# Patient Record
Sex: Female | Born: 1970
Health system: Southern US, Community
[De-identification: ages and names within clinical notes are randomized; demographics above are authoritative.]

## PROBLEM LIST (undated history)

## (undated) DIAGNOSIS — M545 Low back pain, unspecified: Secondary | ICD-10-CM

## (undated) DIAGNOSIS — D649 Anemia, unspecified: Secondary | ICD-10-CM

## (undated) DIAGNOSIS — F419 Anxiety disorder, unspecified: Secondary | ICD-10-CM

## (undated) DIAGNOSIS — I10 Essential (primary) hypertension: Secondary | ICD-10-CM

## (undated) DIAGNOSIS — F329 Major depressive disorder, single episode, unspecified: Secondary | ICD-10-CM

## (undated) HISTORY — DX: Low back pain: M54.5

## (undated) HISTORY — DX: Anxiety disorder, unspecified: F41.9

## (undated) HISTORY — PX: TUBAL LIGATION: SHX77

## (undated) HISTORY — DX: Anemia, unspecified: D64.9

## (undated) HISTORY — DX: Essential (primary) hypertension: I10

## (undated) HISTORY — DX: Low back pain, unspecified: M54.50

## (undated) HISTORY — DX: Major depressive disorder, single episode, unspecified: F32.9

---

## 2000-08-24 ENCOUNTER — Encounter: Admission: RE | Admit: 2000-08-24 | Discharge: 2000-08-24 | Payer: Self-pay

## 2000-11-24 ENCOUNTER — Other Ambulatory Visit: Admission: RE | Admit: 2000-11-24 | Discharge: 2000-11-24 | Payer: Self-pay | Admitting: Internal Medicine

## 2003-12-10 ENCOUNTER — Other Ambulatory Visit: Admission: RE | Admit: 2003-12-10 | Discharge: 2003-12-10 | Payer: Self-pay | Admitting: Gynecology

## 2009-12-05 ENCOUNTER — Inpatient Hospital Stay (HOSPITAL_COMMUNITY): Admission: AD | Admit: 2009-12-05 | Discharge: 2009-12-05 | Payer: Self-pay | Admitting: Obstetrics and Gynecology

## 2009-12-06 ENCOUNTER — Inpatient Hospital Stay (HOSPITAL_COMMUNITY): Admission: AD | Admit: 2009-12-06 | Discharge: 2009-12-06 | Payer: Self-pay | Admitting: Obstetrics and Gynecology

## 2009-12-20 ENCOUNTER — Inpatient Hospital Stay (HOSPITAL_COMMUNITY): Admission: AD | Admit: 2009-12-20 | Discharge: 2009-12-21 | Payer: Self-pay | Admitting: Obstetrics and Gynecology

## 2009-12-20 ENCOUNTER — Ambulatory Visit: Payer: Self-pay | Admitting: Advanced Practice Midwife

## 2010-02-17 ENCOUNTER — Inpatient Hospital Stay (HOSPITAL_COMMUNITY): Admission: AD | Admit: 2010-02-17 | Discharge: 2010-02-17 | Payer: Self-pay | Admitting: Obstetrics and Gynecology

## 2010-02-21 ENCOUNTER — Inpatient Hospital Stay (HOSPITAL_COMMUNITY): Admission: AD | Admit: 2010-02-21 | Discharge: 2010-02-24 | Payer: Self-pay | Admitting: Obstetrics and Gynecology

## 2010-02-22 ENCOUNTER — Encounter (INDEPENDENT_AMBULATORY_CARE_PROVIDER_SITE_OTHER): Payer: Self-pay | Admitting: Obstetrics and Gynecology

## 2010-08-14 LAB — CBC
HCT: 28.5 % — ABNORMAL LOW (ref 36.0–46.0)
HCT: 36.7 % (ref 36.0–46.0)
Hemoglobin: 12.6 g/dL (ref 12.0–15.0)
Hemoglobin: 9.8 g/dL — ABNORMAL LOW (ref 12.0–15.0)
MCH: 32.3 pg (ref 26.0–34.0)
MCH: 33 pg (ref 26.0–34.0)
MCHC: 34.3 g/dL (ref 30.0–36.0)
MCHC: 34.5 g/dL (ref 30.0–36.0)
MCV: 94 fL (ref 78.0–100.0)
MCV: 95.6 fL (ref 78.0–100.0)
Platelets: 239 10*3/uL (ref 150–400)
Platelets: 301 10*3/uL (ref 150–400)
RBC: 2.98 MIL/uL — ABNORMAL LOW (ref 3.87–5.11)
RBC: 3.9 MIL/uL (ref 3.87–5.11)
RDW: 12.8 % (ref 11.5–15.5)
RDW: 13 % (ref 11.5–15.5)
WBC: 12 10*3/uL — ABNORMAL HIGH (ref 4.0–10.5)
WBC: 13.7 10*3/uL — ABNORMAL HIGH (ref 4.0–10.5)

## 2010-08-14 LAB — RPR: RPR Ser Ql: NONREACTIVE

## 2010-08-16 LAB — URINALYSIS, ROUTINE W REFLEX MICROSCOPIC
Bilirubin Urine: NEGATIVE
Glucose, UA: NEGATIVE mg/dL
Ketones, ur: NEGATIVE mg/dL
Leukocytes, UA: NEGATIVE
Nitrite: NEGATIVE
Protein, ur: NEGATIVE mg/dL
Specific Gravity, Urine: <1.005 — ABNORMAL LOW (ref 1.005–1.030)
Urobilinogen, UA: 0.2 mg/dL (ref 0.0–1.0)
pH: 6 (ref 5.0–8.0)

## 2010-08-16 LAB — URINE MICROSCOPIC-ADD ON

## 2010-08-17 LAB — URINALYSIS, ROUTINE W REFLEX MICROSCOPIC
Bilirubin Urine: NEGATIVE
Glucose, UA: NEGATIVE mg/dL
Ketones, ur: NEGATIVE mg/dL
Leukocytes, UA: NEGATIVE
Nitrite: NEGATIVE
Protein, ur: NEGATIVE mg/dL
Specific Gravity, Urine: 1.01 (ref 1.005–1.030)
Urobilinogen, UA: 0.2 mg/dL (ref 0.0–1.0)
pH: 6.5 (ref 5.0–8.0)

## 2010-08-17 LAB — URINE MICROSCOPIC-ADD ON

## 2010-08-17 LAB — FETAL FIBRONECTIN: Fetal Fibronectin: NEGATIVE

## 2012-01-12 ENCOUNTER — Other Ambulatory Visit: Payer: Self-pay | Admitting: Obstetrics and Gynecology

## 2012-01-12 DIAGNOSIS — R928 Other abnormal and inconclusive findings on diagnostic imaging of breast: Secondary | ICD-10-CM

## 2012-01-15 ENCOUNTER — Ambulatory Visit
Admission: RE | Admit: 2012-01-15 | Discharge: 2012-01-15 | Disposition: A | Discharge status: Home / Self Care | Payer: 59 | Source: Ambulatory Visit | Attending: Obstetrics and Gynecology | Admitting: Obstetrics and Gynecology

## 2012-01-15 DIAGNOSIS — R928 Other abnormal and inconclusive findings on diagnostic imaging of breast: Secondary | ICD-10-CM

## 2013-01-29 DIAGNOSIS — L03211 Cellulitis of face: Secondary | ICD-10-CM | POA: Insufficient documentation

## 2013-01-29 DIAGNOSIS — L0201 Cutaneous abscess of face: Secondary | ICD-10-CM

## 2013-01-29 HISTORY — DX: Cutaneous abscess of face: L02.01

## 2014-09-19 ENCOUNTER — Ambulatory Visit (INDEPENDENT_AMBULATORY_CARE_PROVIDER_SITE_OTHER): Payer: 59 | Admitting: Family Medicine

## 2014-09-19 VITALS — BP 148/76 | HR 133 | Temp 97.8°F | Resp 18 | Ht 66.0 in | Wt 200.0 lb

## 2014-09-19 DIAGNOSIS — F411 Generalized anxiety disorder: Secondary | ICD-10-CM

## 2014-09-19 DIAGNOSIS — J329 Chronic sinusitis, unspecified: Secondary | ICD-10-CM

## 2014-09-19 DIAGNOSIS — R030 Elevated blood-pressure reading, without diagnosis of hypertension: Secondary | ICD-10-CM | POA: Diagnosis not present

## 2014-09-19 DIAGNOSIS — H1013 Acute atopic conjunctivitis, bilateral: Secondary | ICD-10-CM | POA: Diagnosis not present

## 2014-09-19 DIAGNOSIS — R000 Tachycardia, unspecified: Secondary | ICD-10-CM | POA: Diagnosis not present

## 2014-09-19 DIAGNOSIS — IMO0001 Reserved for inherently not codable concepts without codable children: Secondary | ICD-10-CM

## 2014-09-19 LAB — BASIC METABOLIC PANEL
BUN: 11 mg/dL (ref 6–23)
CO2: 23 mEq/L (ref 19–32)
Calcium: 9.9 mg/dL (ref 8.4–10.5)
Chloride: 102 mEq/L (ref 96–112)
Creat: 0.84 mg/dL (ref 0.50–1.10)
Glucose, Bld: 151 mg/dL — ABNORMAL HIGH (ref 70–99)
Potassium: 4.5 mEq/L (ref 3.5–5.3)
Sodium: 138 mEq/L (ref 135–145)

## 2014-09-19 LAB — TSH: TSH: 1 u[IU]/mL (ref 0.350–4.500)

## 2014-09-19 MED ORDER — OLOPATADINE HCL 0.1 % OP SOLN: 1.0000 [drp] | Freq: Two times a day (BID) | OPHTHALMIC | Status: DC

## 2014-09-19 MED ORDER — LORAZEPAM 1 MG PO TABS: 1.0000 mg | ORAL_TABLET | Freq: Two times a day (BID) | ORAL | Status: DC | PRN

## 2014-09-19 MED ORDER — METOPROLOL SUCCINATE ER 25 MG PO TB24: 25.0000 mg | ORAL_TABLET | Freq: Every day | ORAL | Status: DC

## 2014-09-19 NOTE — Progress Notes (Signed)
Subjective: Patient has had problems with her sinuses being congested for couple of months. She went to see a doctor 6 days ago at a clinic in [place]. She was treated with prednisone, Levaquin, and took an over-the-counter antihistamine (not sure if it had a decongestant) and a steroid nose spray. She has gotten extremely jittery and nervous last couple of days. Her blood pressure was little elevated when she went to that clinic, she is concerned because it's more elevated and the heart rate is high. She does not have any other underlying diseases that she knows of.  Objective: Very nervous lady throat clear. TMs normal. Neck supple without nodes. Chest clear. Heart tachycardic but no murmur. I counted a pulse of 130  EKG shows sinus tachycardia rate 123.  Assessment: Sinusitis, improving Tachycardia Anxiety Blood pressure elevation Allergic conjunctivitis  Plan: Check BMP and TSH Give metipranolol 25 mg daily to try to slow heart rate and lower blood pressure Await for labs Allergy eyedrops Recheck in one week

## 2014-09-19 NOTE — Patient Instructions (Addendum)
Take the metoprolol 25 mg daily  Return next week for a recheck  Take the lorazepam twice daily only when needed for anxiety  Work hard on eating less and getting some regular exercise to try to lose some weight  Return at anytime return to the emergency room if major concern arises.  Discontinue Levaquin  Complete the prednisone course  Avoid decongestants (antihistamines with a D after that in an prescription  Continue using the nasal spray  Avoid vigorous exercise until we get the heart rate down some  Use allergy drops 1 drop each eye twice daily as needed

## 2014-09-25 ENCOUNTER — Ambulatory Visit (INDEPENDENT_AMBULATORY_CARE_PROVIDER_SITE_OTHER): Payer: 59 | Admitting: Family Medicine

## 2014-09-25 VITALS — BP 116/74 | HR 107 | Temp 98.5°F | Resp 18 | Ht 66.0 in | Wt 200.0 lb

## 2014-09-25 DIAGNOSIS — R739 Hyperglycemia, unspecified: Secondary | ICD-10-CM

## 2014-09-25 DIAGNOSIS — R000 Tachycardia, unspecified: Secondary | ICD-10-CM | POA: Diagnosis not present

## 2014-09-25 DIAGNOSIS — F411 Generalized anxiety disorder: Secondary | ICD-10-CM

## 2014-09-25 LAB — POCT GLYCOSYLATED HEMOGLOBIN (HGB A1C): Hemoglobin A1C: 4.8

## 2014-09-25 LAB — GLUCOSE, POCT (MANUAL RESULT ENTRY): POC Glucose: 97 mg/dl (ref 70–99)

## 2014-09-25 MED ORDER — LORAZEPAM 1 MG PO TABS: ORAL_TABLET | ORAL | Status: DC

## 2014-09-25 NOTE — Patient Instructions (Signed)
Referral will be made to the cardiologist. Someone from this office should contact you about it in the next few days. If you do not hear from the referral by early next week call and speak to our referrals desk.  Take the lorazepam one half to one tablet only when needed for anxiety  I think you can resume doing some walking exercise, but don't try to over exert if it causes her heart to run too fast. Discuss activity with the cardiologist.  Once again advise getting away from the cigarettes and minimizing caffeine intake.

## 2014-09-25 NOTE — Progress Notes (Signed)
Subjective:     Results for orders placed or performed in visit on 09/25/14  POCT glucose (manual entry)  Result Value Ref Range   POC Glucose 97 70 - 99 mg/dl  POCT glycosylated hemoglobin (Hb A1C)  Result Value Ref Range   Hemoglobin A1C 4.8    Assessment: Normal glucose Resting tachycardia, nonspecific Anxiety disorder  Plan: Will go ahead and let a cardiologist check her and decide whether she needs to stay on metoprolol or needs to be on a higher dose of something to keep her heart slower. I decided not to add a another her longer acting antianxiety medication (she does not do well with SSRIs so would probably consider buspirone 7.5 twice a day if I do add something else) but will wait until after the cardiologist does things. If he slows her heart down it may help some of her anxiety feelings also.  Use the lorazepam on an as-needed basis only. Return if necessary at anytime, otherwise plan to see me maybe in late May or early June if needed.

## 2014-09-27 ENCOUNTER — Encounter: Payer: Self-pay | Admitting: Cardiology

## 2014-09-27 NOTE — Progress Notes (Signed)
Patient ID: Brittney Bell, female   DOB: Jul 02, 1970, 44 y.o.   MRN: [id number]   Brittney Bell    Date of visit:  09/27/2014 DOB:  07-08-1970    Age:  43 yrs. Medical record number:  [id number]     Account number:  [id number] Primary Care Provider: HOPPER,DAVID ____________________________ CURRENT DIAGNOSES  1. Tachycardia, unspecified  2. Palpitations  3. Chest pain, unspecified  4. Obesity ____________________________ ALLERGIES  No Known Allergies ____________________________ MEDICATIONS  1. lorazepam 1 mg tablet, PRN  2. metoprolol succinate ER 25 mg tablet,extended release 24 hr, 1 p.o. daily  3. Patanol 0.1 % eye drops, 1 gtt OU bid ____________________________ CHIEF COMPLAINTS  Palpations ____________________________ HISTORY OF PRESENT ILLNESS This very nice 44 year old female seen for evaluation of tachycardia. She has been obese since the birth of her second child a couple of years ago. She recently was treated for sinusitis and then noticed that her heart was racing and she had some trouble with dyspnea and was seen at the urgent care Center. She was found to be tachycardic there and her blood pressure was elevated. She describes episodic racing of her heart rate which has been intermittent through the years but she feels that it has been sustained more recently. She does tend to be anxious. She doesn't use a lot of caffeine but does drink a couple of glasses of wine a night. There is no history of cocaine abuse. She has had some episodic sharp chest pain but no anginal type chest pain. She is able to do housework and other activities normally. She has no PND, orthopnea or significant edema. ____________________________ PAST HISTORY  Past Medical Illnesses:  obesity, hypertension;  Cardiovascular Illnesses:  no previous history of cardiac disease.;  Surgical Procedures:  tubal ligation;  NYHA Classification:  I;  Canadian Angina Classification:  Class 0: Asymptomatic;  Cardiology  Procedures-Invasive:  no history of prior cardiac procedures;  Cardiology Procedures-Noninvasive:  no previous non-invasive procedures;  LVEF not documented,   ____________________________ CARDIO-PULMONARY TEST DATES EKG Date:  09/27/2014;   ____________________________ FAMILY HISTORY Brother -- Brother alive and well Brother -- Brother alive and well Brother -- Brother dead Father -- Father dead, Medical history unknown Mother -- Mother alive with problem, Hypertension, Heart disease ____________________________ SOCIAL HISTORY Alcohol Use:  wine 1-2 per day;  Smoking:  smokes less than 1 ppd, 10 pack year history;  Diet:  regular diet;  Lifestyle:  widowed, divorced and currently living with father of child;  Exercise:  no regular exercise;  Occupation:  Conservator, museum/gallery;   ____________________________ REVIEW OF SYSTEMS General:  obesity  Integumentary:no rashes or new skin lesions. Eyes: wears eye glasses/contact lenses Ears, Nose, Throat, Mouth:  denies any hearing loss, epistaxis, hoarseness or difficulty speaking. Respiratory: denies dyspnea, cough, wheezing or hemoptysis. Cardiovascular:  please review HPI Abdominal: denies dyspepsia, GI bleeding, constipation, or diarrheaGenitourinary-Female: no dysuria, urgency, frequency, UTIs, or stress incontinence Musculoskeletal:  denies arthritis, venous insufficiency, or muscle weakness Neurological:  denies headaches, stroke, or TIA Psychiatric:  situational stress financial issues Hematological/Immunologic:  denies any food allergies, bleeding disorders. ____________________________ PHYSICAL EXAMINATION VITAL SIGNS  Blood Pressure:  142/70 Sitting, Right arm, regular cuff  , 138/72 Standing, Right arm and regular cuff   Pulse:  98/min. Weight:  194.00 lbs. Height:  66"BMI: 31  Constitutional:  anxious white female, in no acute distress, moderately obese Skin:  warm and dry to touch, no apparent skin lesions, or  masses noted. Head:  normocephalic, normal hair pattern, no masses or tenderness Eyes:  EOMS Intact, PERRLA, C and S clear, Funduscopic exam not done. ENT:  ears, nose and throat reveal no gross abnormalities.  Dentition good. Neck:  supple, without massess. No JVD, thyromegaly or carotid bruits. Carotid upstroke normal. Chest:  normal symmetry, clear to auscultation. Cardiac:  regular rhythm, normal S1 and S2, No S3 or S4, no murmurs, gallops or rubs detected. Abdomen:  abdomen soft,non-tender, no masses, no hepatospenomegaly, or aneurysm noted Peripheral Pulses:  the femoral,dorsalis pedis, and posterior tibial pulses are full and equal bilaterally with no bruits auscultated. Extremities & Back:  no deformities, clubbing, cyanosis, erythema or edema observed. Normal muscle strength and tone. Neurological:  no gross motor or sensory deficits noted, affect appropriate, oriented x3. ___________________________ IMPRESSIONS/PLAN  1. Persistent tachycardia of uncertain etiology 2. Chest pain with atypical features 3. Obesity would need to lose weight 4. Episodic palpitations 5. Cigarette abuse advised to stop  Recommendations:  Obtain echocardiogram to evaluate cardiac function to rule out structural heart disease as a cause for her tachycardia. Obtain CBC and comprehensive panel to evaluate liver tests and renal function. We're cardiac event monitor to evaluate palpitations and tachycardia. Followup afterwards. Twelve-lead EKG shows sinus tachycardia with nonspecific ST and T-wave changes. ____________________________ TODAYS ORDERS  1. 2D, color flow, doppler: First Available  2. [place]: Today  3. Return Visit: 1 month  4. 24 hour urine for catecholamines/VMA  5. Complete Blood Count: Today  6. Comprehensive Metabolic Panel: Today  7. 12 Lead EKG: Today                       ____________________________ Cardiology Physician:  Darden PalmerW. Spencer Josede Cicero, Jr. MD [facility]

## 2014-12-19 ENCOUNTER — Ambulatory Visit (INDEPENDENT_AMBULATORY_CARE_PROVIDER_SITE_OTHER): Payer: 59 | Admitting: Emergency Medicine

## 2014-12-19 VITALS — BP 116/72 | HR 81 | Temp 98.1°F | Resp 18 | Ht 66.0 in | Wt 199.0 lb

## 2014-12-19 DIAGNOSIS — R062 Wheezing: Secondary | ICD-10-CM | POA: Diagnosis not present

## 2014-12-19 DIAGNOSIS — J029 Acute pharyngitis, unspecified: Secondary | ICD-10-CM | POA: Diagnosis not present

## 2014-12-19 LAB — POCT CBC
Granulocyte percent: 64.9 %G (ref 37–80)
HCT, POC: 41.4 % (ref 37.7–47.9)
Hemoglobin: 14.1 g/dL (ref 12.2–16.2)
Lymph, poc: 2.6 (ref 0.6–3.4)
MCH, POC: 30.9 pg (ref 27–31.2)
MCHC: 34.1 g/dL (ref 31.8–35.4)
MCV: 90.4 fL (ref 80–97)
MID (cbc): 0.4 (ref 0–0.9)
MPV: 7.8 fL (ref 0–99.8)
POC Granulocyte: 5.5 (ref 2–6.9)
POC LYMPH PERCENT: 30.7 %L (ref 10–50)
POC MID %: 4.4 %M (ref 0–12)
Platelet Count, POC: 310 10*3/uL (ref 142–424)
RBC: 4.57 M/uL (ref 4.04–5.48)
RDW, POC: 12.1 %
WBC: 8.4 10*3/uL (ref 4.6–10.2)

## 2014-12-19 LAB — POCT RAPID STREP A (OFFICE): Rapid Strep A Screen: NEGATIVE

## 2014-12-19 MED ORDER — AZITHROMYCIN 250 MG PO TABS
ORAL_TABLET | ORAL | Status: DC
Start: 2014-12-19 — End: 2014-12-19

## 2014-12-19 MED ORDER — AMOXICILLIN-POT CLAVULANATE 875-125 MG PO TABS: 1.0000 | ORAL_TABLET | Freq: Two times a day (BID) | ORAL | Status: DC

## 2014-12-19 MED ORDER — ALBUTEROL SULFATE HFA 108 (90 BASE) MCG/ACT IN AERS: 2.0000 | INHALATION_SPRAY | RESPIRATORY_TRACT | Status: DC | PRN

## 2014-12-19 MED ORDER — ALBUTEROL SULFATE (2.5 MG/3ML) 0.083% IN NEBU
2.5000 mg | INHALATION_SOLUTION | Freq: Once | RESPIRATORY_TRACT | Status: AC
  Administered 2014-12-19: 2.5 mg via RESPIRATORY_TRACT

## 2014-12-19 NOTE — Progress Notes (Addendum)
Patient ID: Brittney Bell, female   DOB: Oct 30, 1970, 44 y.o.   MRN: [id number]     This chart was scribed for Lesle ChrisSteven Daub, MD by Littie Deedsichard Sun, Medical Scribe. This patient was seen in room 12 and the patient's care was started at 1:17 PM.   Chief Complaint:  Chief Complaint  Patient presents with  . Cough    x1 child has uri   . Chest Pain    with cough     HPI: Brittney Bell is a 44 y.o. female [facility] complaining of gradual onset, intermittent, minimally productive cough that started 1 week ago. Patient also reports wheezing, intermittent low-grade fever, and chest tightness. She has been taking Theraflu, Zyrtec and a nasal spray. Of note, her daughter had a URI about 10 days ago. Patient is a 0.25 ppd smoker.  Past Medical History  Diagnosis Date  . Hypertension    Past Surgical History  Procedure Laterality Date  . Tubal ligation     History   Social History  . Marital Status: Divorced    Spouse Name: N/A  . Number of Children: N/A  . Years of Education: N/A   Social History Main Topics  . Smoking status: Current Some Day Smoker  . Smokeless tobacco: Not on file  . Alcohol Use: 0.0 oz/week    0 Standard drinks or equivalent per week  . Drug Use: No  . Sexual Activity: Yes   Other Topics Concern  . None   Social History Narrative   Family History  Problem Relation Age of Onset  . Heart disease Mother   . Hypertension Mother    No Known Allergies Prior to Admission medications   Medication Sig Start Date End Date Taking? Authorizing Provider  LORazepam (ATIVAN) 1 MG tablet Take one half to one pill only when needed for anxiety, once or twice a day if necessary. 09/25/14  Yes Peyton Najjaravid H Hopper, MD  metoprolol succinate (TOPROL-XL) 25 MG 24 hr tablet Take 1 tablet (25 mg total) by mouth daily. 09/19/14  Yes Peyton Najjaravid H Hopper, MD  levofloxacin (LEVAQUIN) 500 MG tablet Take 500 mg by mouth daily.    Historical Provider, MD  olopatadine (PATANOL)  0.1 % ophthalmic solution Place 1 drop into both eyes 2 (two) times daily. Patient not taking: Reported on 12/19/2014 09/19/14   Peyton Najjaravid H Hopper, MD  predniSONE (DELTASONE) 20 MG tablet Take 20 mg by mouth 3 (three) times daily.    Historical Provider, MD     ROS: The patient denies chills, night sweats, unintentional weight loss, chest pain, palpitations, dyspnea on exertion, nausea, vomiting, abdominal pain, dysuria, hematuria, melena, numbness, weakness, or tingling.   All other systems have been reviewed and were otherwise negative with the exception of those mentioned in the HPI and as above.    PHYSICAL EXAM: Filed Vitals:   12/19/14 1300  BP: 116/72  Pulse: 81  Temp: 98.1 F (36.7 C)  Resp: 18   Body mass index is 32.13 kg/(m^2).   General: Alert, no acute distress HEENT:  Normocephalic, atraumatic, oropharynx patent. Throat and ears normal. [facility] Cardiovascular:  Regular rate and rhythm, no rubs murmurs or gallops.  No Carotid bruits, radial pulse intact. No pedal edema.  Respiratory: Bilateral inspiratory and expiratory wheezes. Abdominal: No organomegaly, abdomen is soft and non-tender, positive bowel sounds.  No masses. Musculoskeletal: Gait intact. No edema, tenderness Skin: No rashes. Neurologic: Facial musculature symmetric. Psychiatric: Patient acts appropriately throughout  our interaction. Lymphatic: No cervical or submandibular lymphadenopathy Genitourinary/Anorectal: No acute findings    LABS: Results for orders placed or performed in visit on 12/19/14  POCT CBC  Result Value Ref Range   WBC 8.4 4.6 - 10.2 K/uL   Lymph, poc 2.6 0.6 - 3.4   POC LYMPH PERCENT 30.7 10 - 50 %L   MID (cbc) 0.4 0 - 0.9   POC MID % 4.4 0 - 12 %M   POC Granulocyte 5.5 2 - 6.9   Granulocyte percent 64.9 37 - 80 %G   RBC 4.57 4.04 - 5.48 M/uL   Hemoglobin 14.1 12.2 - 16.2 g/dL   HCT, POC 78.2 95.6 - 47.9 %   MCV 90.4 80 - 97 fL   MCH, POC 30.9 27 - 31.2 pg   MCHC  34.1 31.8 - 35.4 g/dL   RDW, POC 21.3 %   Platelet Count, POC 310 142 - 424 K/uL   MPV 7.8 0 - 99.8 fL  POCT rapid strep A  Result Value Ref Range   Rapid Strep A Screen Negative Negative     EKG/XRAY:   Primary read interpreted by Dr. Cleta Alberts at [facility]. Not done   ASSESSMENT/PLAN: Patient seen with acute bronchitis with bronchospasm. She is a smoker. Will treat with a Z-Pak and albuterol HFA inhaler.I personally performed the services described in this documentation, which was scribed in my presence. The recorded information has been reviewed and is accurate patient did feel better after her nebulizer treatment.Marland Kitchens Gross sideeffects, risk and benefits, and alternatives of medications d/w patient. Patient is aware that all medications have potential sideeffects and we are unable to predict every sideeffect or drug-drug interaction that may occur.I personally performed the services described in this documentation, which was scribed in my presence. The recorded information has been reviewed and is accurate.    Lesle Chris MD 12/19/2014 1:17 PM

## 2014-12-19 NOTE — Patient Instructions (Signed)

## 2015-11-29 ENCOUNTER — Ambulatory Visit (INDEPENDENT_AMBULATORY_CARE_PROVIDER_SITE_OTHER): Payer: BLUE CROSS/BLUE SHIELD | Admitting: Family Medicine

## 2015-11-29 VITALS — BP 150/98 | HR 77 | Temp 97.8°F | Resp 16 | Ht 67.0 in | Wt 200.0 lb

## 2015-11-29 DIAGNOSIS — R000 Tachycardia, unspecified: Secondary | ICD-10-CM | POA: Diagnosis not present

## 2015-11-29 DIAGNOSIS — I10 Essential (primary) hypertension: Secondary | ICD-10-CM | POA: Diagnosis not present

## 2015-11-29 DIAGNOSIS — S40262A Insect bite (nonvenomous) of left shoulder, initial encounter: Secondary | ICD-10-CM

## 2015-11-29 DIAGNOSIS — S30860A Insect bite (nonvenomous) of lower back and pelvis, initial encounter: Secondary | ICD-10-CM

## 2015-11-29 DIAGNOSIS — A084 Viral intestinal infection, unspecified: Secondary | ICD-10-CM

## 2015-11-29 DIAGNOSIS — W57XXXA Bitten or stung by nonvenomous insect and other nonvenomous arthropods, initial encounter: Secondary | ICD-10-CM

## 2015-11-29 DIAGNOSIS — H6983 Other specified disorders of Eustachian tube, bilateral: Secondary | ICD-10-CM | POA: Diagnosis not present

## 2015-11-29 MED ORDER — METOPROLOL SUCCINATE ER 25 MG PO TB24: 25.0000 mg | ORAL_TABLET | Freq: Every day | ORAL | Status: DC

## 2015-11-29 MED ORDER — METOPROLOL SUCCINATE ER 50 MG PO TB24: 50.0000 mg | ORAL_TABLET | Freq: Every day | ORAL | Status: DC

## 2015-11-29 MED ORDER — TRIAMCINOLONE ACETONIDE 0.1 % EX CREA: 1.0000 "application " | TOPICAL_CREAM | Freq: Two times a day (BID) | CUTANEOUS | Status: DC

## 2015-11-29 MED ORDER — FLUTICASONE PROPIONATE 50 MCG/ACT NA SUSP: 2.0000 | Freq: Every day | NASAL | Status: DC

## 2015-11-29 NOTE — Progress Notes (Signed)
Patient ID: Brittney Bell, female    DOB: 1970-08-28  Age: 45 y.o. MRN: [id number]  Chief Complaint  Patient presents with  . Headache  . Sinusitis  . Abdominal Pain    x 4 days  . Diarrhea    x 4 days  . Otitis Media  . insect bites    buttocks/ left shoulder x 4 days  . Depression    See screening    Subjective:   Patient is here for a number of things. She has some insect bites their itching her a lot. Her daughter who is 46 years old had a viral gastroenteritis earlier in the week. The patient has had some diarrhea the last few days. Only a couple times a day. She also is been having some discomfort and popping and pain in her ears. She's has a headache. She takes her blood pressure medicine faithfully. She gets exercise at work but does not do any purposeful exercise. She is here with her college age daughter. She is under stress at home. She is living with the same man for about 5 or 6 years, has a child by him so she stays with him but she is trying to save her money to move out and get away. She says he is hardly with she wants to get away from it.  Current allergies, medications, problem list, past/family and social histories reviewed.  Objective:  BP 150/98 mmHg  Pulse 77  Temp(Src) 97.8 F (36.6 C) (Oral)  Resp 16  Ht 5\' 7"  (1.702 m)  Wt 200 lb (90.719 kg)  BMI 31.32 kg/m2  SpO2 100%  LMP 11/24/2015  No major acute distress. TMs normal. Throat clear. Neck supple without nodes or thyromegaly. Chest clear to auscultation. Heart regular without murmur. Abdomen soft without mass or tenderness. Has some insect bites her left shoulder but asked. Extremities are without edema. Skin normal otherwise.    Assessment & Plan:   Assessment: 1. Tachycardia   2. Essential hypertension   3. Eustachian tube dysfunction, bilateral   4. Viral gastroenteritis   5. Insect bites       Plan: See instructions  No orders of the defined types were placed in this encounter.     Meds ordered this encounter  Medications  . DISCONTD: metoprolol succinate (TOPROL-XL) 25 MG 24 hr tablet    Sig: Take 1 tablet (25 mg total) by mouth daily.    Dispense:  30 tablet    Refill:  3  . fluticasone (FLONASE) 50 MCG/ACT nasal spray    Sig: Place 2 sprays into both nostrils daily.    Dispense:  16 g    Refill:  6  . triamcinolone cream (KENALOG) 0.1 %    Sig: Apply 1 application topically 2 (two) times daily.    Dispense:  30 g    Refill:  0  . metoprolol succinate (TOPROL-XL) 50 MG 24 hr tablet    Sig: Take 1 tablet (50 mg total) by mouth daily.    Dispense:  90 tablet    Refill:  1    Please note I accidentally sent in a prescription for 25 mg. Discontinue that and give her the prescription for 50 mg pills.         Patient Instructions   Use triamcinolone cream twice daily on the insect bites  Increase the blood pressure medication to metoprolol 50 mg daily. Make certain that the level the pharmacy getting her shoes for the 50 mg pills  and not from 25 mg pills. You can double up on your existing 5 mg pills until they are gone.  Work hard on trying to decrease your cigarettes with a goal of getting away from them  Get regular exercise  If the diarrhea persists please return  Use the fluticasone nose spray 2 sprays each nostril twice daily as directed for for 5 days, then decrease to once daily  Plan to return for a regular follow-up on your blood pressure in 4-6 months, sooner if problems arise     IF you received an x-ray today, you will receive an invoice from [facility]. Please contact [facility] at [phone] with questions or concerns regarding your invoice.   IF you received labwork today, you will receive an invoice from [facility]. Please contact [facility] at [phone] with questions or concerns regarding your invoice.   Our billing staff will not be able to assist you with questions regarding  bills from these companies.  You will be contacted with the lab results as soon as they are available. The fastest way to get your results is to activate your My Chart account. Instructions are located on the last page of this paperwork. If you have not heard from us regarding the results in 2 weeks, please contact this office.         Return in about 4 months (around 03/30/2016).   HOPPER,DAVID, MD 11/29/2015

## 2015-11-29 NOTE — Patient Instructions (Addendum)
Use triamcinolone cream twice daily on the insect bites  Increase the blood pressure medication to metoprolol 50 mg daily. Make certain that the level the pharmacy getting her shoes for the 50 mg pills and not from 25 mg pills. You can double up on your existing 5 mg pills until they are gone.  Work hard on trying to decrease your cigarettes with a goal of getting away from them  Get regular exercise  If the diarrhea persists please return  Use the fluticasone nose spray 2 sprays each nostril twice daily as directed for for 5 days, then decrease to once daily  Plan to return for a regular follow-up on your blood pressure in 4-6 months, sooner if problems arise     IF you received an x-ray today, you will receive an invoice from [facility]. Please contact [facility] at [phone] with questions or concerns regarding your invoice.   IF you received labwork today, you will receive an invoice from [facility]. Please contact [facility] at [phone] with questions or concerns regarding your invoice.   Our billing staff will not be able to assist you with questions regarding bills from these companies.  You will be contacted with the lab results as soon as they are available. The fastest way to get your results is to activate your My Chart account. Instructions are located on the last page of this paperwork. If you have not heard from us regarding the results in 2 weeks, please contact this office.

## 2015-12-04 ENCOUNTER — Ambulatory Visit (INDEPENDENT_AMBULATORY_CARE_PROVIDER_SITE_OTHER): Payer: BLUE CROSS/BLUE SHIELD | Admitting: Physician Assistant

## 2015-12-04 ENCOUNTER — Ambulatory Visit (INDEPENDENT_AMBULATORY_CARE_PROVIDER_SITE_OTHER): Payer: BLUE CROSS/BLUE SHIELD

## 2015-12-04 ENCOUNTER — Telehealth: Payer: Self-pay

## 2015-12-04 VITALS — BP 114/68 | HR 67 | Temp 98.0°F | Resp 16 | Ht 67.0 in | Wt 195.6 lb

## 2015-12-04 DIAGNOSIS — M542 Cervicalgia: Secondary | ICD-10-CM | POA: Diagnosis not present

## 2015-12-04 DIAGNOSIS — R519 Headache, unspecified: Secondary | ICD-10-CM

## 2015-12-04 DIAGNOSIS — G44219 Episodic tension-type headache, not intractable: Secondary | ICD-10-CM | POA: Diagnosis not present

## 2015-12-04 DIAGNOSIS — M47812 Spondylosis without myelopathy or radiculopathy, cervical region: Secondary | ICD-10-CM

## 2015-12-04 DIAGNOSIS — R51 Headache: Secondary | ICD-10-CM

## 2015-12-04 DIAGNOSIS — H04123 Dry eye syndrome of bilateral lacrimal glands: Secondary | ICD-10-CM | POA: Diagnosis not present

## 2015-12-04 DIAGNOSIS — M5481 Occipital neuralgia: Secondary | ICD-10-CM | POA: Diagnosis not present

## 2015-12-04 LAB — POCT CBC
Granulocyte percent: 66.1 %G (ref 37–80)
HCT, POC: 42.6 % (ref 37.7–47.9)
Hemoglobin: 14.9 g/dL (ref 12.2–16.2)
Lymph, poc: 2.2 (ref 0.6–3.4)
MCH, POC: 32.4 pg — AB (ref 27–31.2)
MCHC: 34.9 g/dL (ref 31.8–35.4)
MCV: 93 fL (ref 80–97)
MID (cbc): 0.5 (ref 0–0.9)
MPV: 7.6 fL (ref 0–99.8)
POC Granulocyte: 5.2 (ref 2–6.9)
POC LYMPH PERCENT: 28.1 %L (ref 10–50)
POC MID %: 5.8 %M (ref 0–12)
Platelet Count, POC: 270 10*3/uL (ref 142–424)
RBC: 4.59 M/uL (ref 4.04–5.48)
RDW, POC: 13 %
WBC: 7.8 10*3/uL (ref 4.6–10.2)

## 2015-12-04 LAB — COMPLETE METABOLIC PANEL WITH GFR
ALT: 16 U/L (ref 6–29)
AST: 17 U/L (ref 10–35)
Albumin: 4.8 g/dL (ref 3.6–5.1)
Alkaline Phosphatase: 72 U/L (ref 33–115)
BUN: 6 mg/dL — ABNORMAL LOW (ref 7–25)
CO2: 23 mmol/L (ref 20–31)
Calcium: 9.7 mg/dL (ref 8.6–10.2)
Chloride: 106 mmol/L (ref 98–110)
Creat: 0.85 mg/dL (ref 0.50–1.10)
GFR, Est African American: >89 mL/min (ref >=60)
GFR, Est Non African American: 82 mL/min (ref >=60)
Glucose, Bld: 88 mg/dL (ref 65–99)
Potassium: 4.4 mmol/L (ref 3.5–5.3)
Sodium: 140 mmol/L (ref 135–146)
Total Bilirubin: 0.5 mg/dL (ref 0.2–1.2)
Total Protein: 8 g/dL (ref 6.1–8.1)

## 2015-12-04 LAB — POCT SEDIMENTATION RATE: POCT SED RATE: 10 mm/hr (ref 0–22)

## 2015-12-04 MED ORDER — CYCLOBENZAPRINE HCL 10 MG PO TABS: 5.0000 mg | ORAL_TABLET | Freq: Every day | ORAL | Status: DC

## 2015-12-04 MED ORDER — PREDNISONE 20 MG PO TABS: ORAL_TABLET | ORAL | Status: AC

## 2015-12-04 NOTE — Progress Notes (Signed)
12/04/2015 1:18 PM   DOB: November 16, 1970 / MRN: [id number]  SUBJECTIVE:  Brittney Bell is a 45 y.o. female presenting for a left sided HA which she reports as a dull ache This started 5 days ago.  She is somewhat pan-symptomatic and associates temporal pain and changes in vision.  She also complains of neck pain with radiation to all five fingers however her arm is not numb. She denies any global weakness and confusion.  She was sick with a cold a few weeks ago but feels this has resolved.  She recently saw Dr. Alwyn RenHopper for the same complaint and in increased her BP medication.    She is allergic to levaquin.   She  has a past medical history of Hypertension.    She  reports that she has been smoking.  She does not have any smokeless tobacco history on file. She reports that she drinks alcohol. She reports that she does not use illicit drugs. She  reports that she currently engages in sexual activity. The patient  has past surgical history that includes Tubal ligation.  Her family history includes Heart disease in her mother; Hypertension in her mother.  Review of Systems  Constitutional: Negative for fever and chills.  HENT: Negative for congestion, ear pain and sore throat.   Eyes: Positive for photophobia. Negative for blurred vision, double vision, pain, discharge and redness.  Cardiovascular: Negative for chest pain.  Skin: Negative for rash.  Neurological: Positive for headaches.    Problem list and medications reviewed and updated by myself where necessary, and exist elsewhere in the encounter.   OBJECTIVE:  BP 114/68 mmHg  Pulse 67  Temp(Src) 98 F (36.7 C) (Oral)  Resp 16  Ht 5\' 7"  (1.702 m)  Wt 195 lb 9.6 oz (88.724 kg)  BMI 30.63 kg/m2  SpO2 98%  LMP 11/24/2015  Physical Exam  Constitutional: She is oriented to person, place, and time. She appears well-nourished. No distress.  Eyes: EOM are normal. Pupils are equal, round, and reactive to light.  Cardiovascular:  Normal rate.   Pulmonary/Chest: Effort normal.  Abdominal: She exhibits no distension.  Neurological: She is alert and oriented to person, place, and time. She has normal strength and normal reflexes. No cranial nerve deficit or sensory deficit. She displays a negative Romberg sign. Coordination and gait normal. GCS eye subscore is 4. GCS verbal subscore is 5. GCS motor subscore is 6.  Reflex Scores:      Tricep reflexes are 2+ on the right side and 2+ on the left side.      Bicep reflexes are 2+ on the right side and 2+ on the left side.      Brachioradialis reflexes are 2+ on the right side and 2+ on the left side.      Patellar reflexes are 2+ on the right side and 2+ on the left side.      Achilles reflexes are 2+ on the right side and 2+ on the left side. Skin: Skin is dry. She is not diaphoretic.  Psychiatric: She has a normal mood and affect.  Vitals reviewed.    Visual Acuity Screening   Right eye Left eye Both eyes  Without correction:     With correction: 20/25 20/50 20/25    No results found.  Lab Results  Component Value Date   TSH 1.000 09/19/2014   Lab Results  Component Value Date   WBC 7.8 12/04/2015   HGB 14.9 12/04/2015   HCT 42.6  12/04/2015   MCV 93.0 12/04/2015   PLT 239 02/23/2010   Lab Results  Component Value Date   CREATININE 0.84 09/19/2014   Lab Results  Component Value Date   HGBA1C 4.8 09/25/2014    ASSESSMENT AND PLAN  Brittney Bell was seen today for headache.  Diagnoses and all orders for this visit:  Episodic tension-type headache, not intractable: Her cranial nerves are intact and she is well appearing. Her speech is clear, her gait is normal, and there is no weakness. Rads do show that she has some end plate spurring which explains why her left arm has some tingling. I will start her on predisone today.  Sed rate reassuring.  With regard to her complaint of blurry vision, I can not account for this, however I think this may be a  happenstance finding and I have advised that she go to Dr. Laruth BouchardGroat's office to be seen as she does have a history of glaucoma.   -     DG Cervical Spine 2 or 3 views; Future -     DG SinUS 1-2 Views; Future -     POCT CBC -     COMPLETE METABOLIC PANEL WITH GFR  Temporal pain -     POCT SEDIMENTATION RATE    The patient was advised to call or return to clinic if she does not see an improvement in symptoms, or to seek the care of the closest emergency department if she worsens with the above plan.   Deliah BostonMichael Clark, MHS, PA-C [facility] 12/04/2015 1:18 PM

## 2015-12-04 NOTE — Telephone Encounter (Signed)
Pt advised to come in. Pt transferred to appts.

## 2015-12-04 NOTE — Patient Instructions (Signed)
     IF you received an x-ray today, you will receive an invoice from [facility]. Please contact [facility] at [phone] with questions or concerns regarding your invoice.   IF you received labwork today, you will receive an invoice from [facility]. Please contact [facility] at [phone] with questions or concerns regarding your invoice.   Our billing staff will not be able to assist you with questions regarding bills from these companies.  You will be contacted with the lab results as soon as they are available. The fastest way to get your results is to activate your My Chart account. Instructions are located on the last page of this paperwork. If you have not heard from us regarding the results in 2 weeks, please contact this office.      

## 2015-12-04 NOTE — Telephone Encounter (Signed)
Patient was recently seen and states that the blood pressure prescribed helped her blood pressure but it's causing headache/migraines on left side, fingers tingling, tightness in neck, and eye sensitivity. Please advise patient! Patient states her bp is 109/70 and pulse is 50. [phone]

## 2015-12-05 ENCOUNTER — Telehealth: Payer: Self-pay

## 2015-12-05 NOTE — Telephone Encounter (Signed)
Letter faxed.

## 2015-12-05 NOTE — Telephone Encounter (Signed)
Pt is needing a work note stating that she was here yesterday and that we sent her to eye doctor and returning back to work today  Best number [phone] please fax note to [phone]

## 2016-02-18 ENCOUNTER — Other Ambulatory Visit: Payer: Self-pay | Admitting: Emergency Medicine

## 2016-02-18 DIAGNOSIS — R062 Wheezing: Secondary | ICD-10-CM

## 2016-09-21 ENCOUNTER — Ambulatory Visit (INDEPENDENT_AMBULATORY_CARE_PROVIDER_SITE_OTHER): Payer: BLUE CROSS/BLUE SHIELD

## 2016-09-21 ENCOUNTER — Ambulatory Visit (INDEPENDENT_AMBULATORY_CARE_PROVIDER_SITE_OTHER): Payer: BLUE CROSS/BLUE SHIELD | Admitting: Physician Assistant

## 2016-09-21 VITALS — BP 160/89 | HR 111 | Temp 98.5°F | Resp 17 | Ht 67.0 in | Wt 212.0 lb

## 2016-09-21 DIAGNOSIS — R0989 Other specified symptoms and signs involving the circulatory and respiratory systems: Secondary | ICD-10-CM

## 2016-09-21 DIAGNOSIS — R030 Elevated blood-pressure reading, without diagnosis of hypertension: Secondary | ICD-10-CM | POA: Diagnosis not present

## 2016-09-21 DIAGNOSIS — J181 Lobar pneumonia, unspecified organism: Secondary | ICD-10-CM | POA: Diagnosis not present

## 2016-09-21 DIAGNOSIS — J189 Pneumonia, unspecified organism: Secondary | ICD-10-CM

## 2016-09-21 LAB — POCT CBC
Granulocyte percent: 70.7 %G (ref 37–80)
HCT, POC: 37.3 % — AB (ref 37.7–47.9)
Hemoglobin: 13.1 g/dL (ref 12.2–16.2)
Lymph, poc: 2.3 (ref 0.6–3.4)
MCH, POC: 31.8 pg — AB (ref 27–31.2)
MCHC: 35.2 g/dL (ref 31.8–35.4)
MCV: 90.3 fL (ref 80–97)
MID (cbc): 0.7 (ref 0–0.9)
MPV: 6.2 fL (ref 0–99.8)
POC Granulocyte: 7.2 — AB (ref 2–6.9)
POC LYMPH PERCENT: 22.1 %L (ref 10–50)
POC MID %: 7.2 %M (ref 0–12)
Platelet Count, POC: 393 10*3/uL (ref 142–424)
RBC: 4.13 M/uL (ref 4.04–5.48)
RDW, POC: 12.2 %
WBC: 10.2 10*3/uL (ref 4.6–10.2)

## 2016-09-21 MED ORDER — AZITHROMYCIN 250 MG PO TABS: ORAL_TABLET | ORAL | 0 refills | Status: AC

## 2016-09-21 NOTE — Patient Instructions (Signed)
     IF you received an x-ray today, you will receive an invoice from [facility]. Please contact [facility] at [phone] with questions or concerns regarding your invoice.   IF you received labwork today, you will receive an invoice from [facility]. Please contact [facility] at [phone] with questions or concerns regarding your invoice.   Our billing staff will not be able to assist you with questions regarding bills from these companies.  You will be contacted with the lab results as soon as they are available. The fastest way to get your results is to activate your My Chart account. Instructions are located on the last page of this paperwork. If you have not heard from us regarding the results in 2 weeks, please contact this office.     

## 2016-09-21 NOTE — Progress Notes (Signed)
09/21/2016 12:11 PM   DOB: 22-Apr-1971 / MRN: [id number]  SUBJECTIVE:  Brittney Bell is a 46 y.o. well appearing female presenting for myalgia that started about 9 days ago.  Associates cough that started 4 days ago. She feels that she is getting worse.  She has tried Ibuprofen and Tylenol both of which have helped.  Multiple sick contacts at work. Pneumonia is going around at her jobs.  She quit smoking July first. No tic bites and lives in an apartment.   She is allergic to levaquin [levofloxacin].   She  has a past medical history of Hypertension.    She  reports that she quit smoking about 9 months ago. She has never used smokeless tobacco. She reports that she drinks alcohol. She reports that she does not use drugs. She  reports that she currently engages in sexual activity. The patient  has a past surgical history that includes Tubal ligation.  Her family history includes Heart disease in her mother; Hypertension in her mother.  Review of Systems  Constitutional: Negative for chills and fever.  Respiratory: Positive for cough, sputum production and wheezing. Negative for hemoptysis and shortness of breath.   Cardiovascular: Negative for chest pain and leg swelling.  Musculoskeletal: Positive for myalgias.  Neurological: Negative for dizziness.    The problem list and medications were reviewed and updated by myself where necessary and exist elsewhere in the encounter.   OBJECTIVE:  BP (!) 160/89 (BP Location: Right Arm, Patient Position: Sitting, Cuff Size: Normal)   Pulse (!) 111   Temp 98.5 F (36.9 C) (Oral)   Resp 17   Ht  (1.702 m)   Wt 212 lb (96.2 kg)   LMP 08/30/2016   SpO2 98%   BMI 33.20 kg/m   Physical Exam  Constitutional: She is active.  Non-toxic appearance. No distress.  HENT:  Mouth/Throat: No oropharyngeal exudate.  Cardiovascular: Regular rhythm, normal heart sounds and intact distal pulses.   Pulmonary/Chest: Effort normal. No tachypnea. No  respiratory distress. She has wheezes (Right lower lobe). She has rales. She exhibits no tenderness.  Musculoskeletal: She exhibits no edema.  Neurological: She is alert. No cranial nerve deficit.  Skin: Skin is warm and dry. She is not diaphoretic. No pallor.    Results for orders placed or performed in visit on 09/21/16 (from the past 72 hour(s))  POCT CBC     Status: Abnormal   Collection Time: 09/21/16 12:04 PM  Result Value Ref Range   WBC 10.2 4.6 - 10.2 K/uL   Lymph, poc 2.3 0.6 - 3.4   POC LYMPH PERCENT 22.1 10 - 50 %L   MID (cbc) 0.7 0 - 0.9   POC MID % 7.2 0 - 12 %M   POC Granulocyte 7.2 (A) 2 - 6.9   Granulocyte percent 70.7 37 - 80 %G   RBC 4.13 4.04 - 5.48 M/uL   Hemoglobin 13.1 12.2 - 16.2 g/dL   HCT, POC 40.9 (A) 81.1 - 47.9 %   MCV 90.3 80 - 97 fL   MCH, POC 31.8 (A) 27 - 31.2 pg   MCHC 35.2 31.8 - 35.4 g/dL   RDW, POC 91.4 %   Platelet Count, POC 393 142 - 424 K/uL   MPV 6.2 0 - 99.8 fL    Dg Chest 2 View  Result Date: 09/21/2016 CLINICAL DATA:  Abnormal sounds in the right lower lobe. Wheezing. Tachycardia. EXAM: CHEST  2 VIEW COMPARISON:  None FINDINGS: There is  interstitial prominence in the lower lungs. Heart is normal size. No effusions. No acute bony abnormality. IMPRESSION: Interstitial prominence, most pronounced in a lower lung zones. This could reflect bronchitis or interstitial pneumonia. Electronically Signed   By: Charlett Nose M.D.   On: 09/21/2016 12:01   Lab Results  Component Value Date   CREATININE 0.85 12/04/2015     ASSESSMENT AND PLAN:  Steele was seen today for cough, generalized body aches and fever.  Diagnoses and all orders for this visit:  Rales 2/3 way up posterior chest wall on right side: Will see her back on Wednesday.  -     DG Chest 2 View; Future -     POCT CBC -     Basic metabolic panel  Elevated blood pressure reading: Will recheck this on Wednesday as well.   Pneumonia of left lower lobe due to infectious  organism (HCC) -     azithromycin (ZITHROMAX) 250 MG tablet; Take 2 tabs PO x 1 dose, then 1 tab PO QD x 4 days    The patient is advised to call or return to clinic if she does not see an improvement in symptoms, or to seek the care of the closest emergency department if she worsens with the above plan.   Deliah Boston, MHS, PA-C [facility] 09/21/2016 12:11 PM

## 2016-09-22 ENCOUNTER — Encounter: Payer: Self-pay | Admitting: Radiology

## 2016-09-22 ENCOUNTER — Telehealth: Payer: Self-pay | Admitting: Physician Assistant

## 2016-09-22 LAB — BASIC METABOLIC PANEL
BUN/Creatinine Ratio: 10 (ref 9–23)
BUN: 7 mg/dL (ref 6–24)
CO2: 21 mmol/L (ref 18–29)
Calcium: 9.2 mg/dL (ref 8.7–10.2)
Chloride: 99 mmol/L (ref 96–106)
Creatinine, Ser: 0.72 mg/dL (ref 0.57–1.00)
GFR calc Af Amer: 116 mL/min/{1.73_m2} (ref >59)
GFR calc non Af Amer: 101 mL/min/{1.73_m2} (ref >59)
Glucose: 83 mg/dL (ref 65–99)
Potassium: 3.9 mmol/L (ref 3.5–5.2)
Sodium: 137 mmol/L (ref 134–144)

## 2016-09-22 NOTE — Telephone Encounter (Addendum)
PATIENT WAS DIAGNOSED WITH PNEUMONIA ON Monday (09/21/16) BY MICHAEL CLARK. HE TOLD HER SHE SHOULD START TO FEEL BETTER IN ABOUT 24 HOURS. SHE DOES NOT FEEL MUCH BETTER AND SHE IS COUGHING SO MUCH THAT IT IS CAUSING HER TO HAVE A HEADACHE. SHE HAS NOT BEEN ABLE TO SLEEP. SHE WOULD LIKE TO HAVE SOMETHING CALLED INTO HER PHARMACY FOR THE COUGH. BEST PHONE [phone] (CELL) PHARMACY CHOICE IS [facility] IN [place]. MBC

## 2016-09-22 NOTE — Progress Notes (Signed)
Please make patient aware of results via letter. In the context of her overall presentation any abnormal values are of no clinical significance.  Meleena Munroe PA-C, 09/22/2016 8:35 AM

## 2016-09-23 ENCOUNTER — Ambulatory Visit (INDEPENDENT_AMBULATORY_CARE_PROVIDER_SITE_OTHER): Payer: BLUE CROSS/BLUE SHIELD

## 2016-09-23 ENCOUNTER — Ambulatory Visit (INDEPENDENT_AMBULATORY_CARE_PROVIDER_SITE_OTHER): Payer: BLUE CROSS/BLUE SHIELD | Admitting: Family Medicine

## 2016-09-23 VITALS — BP 138/82 | HR 81 | Temp 98.4°F | Resp 16 | Ht 66.0 in | Wt 206.6 lb

## 2016-09-23 DIAGNOSIS — R059 Cough, unspecified: Secondary | ICD-10-CM

## 2016-09-23 DIAGNOSIS — R05 Cough: Secondary | ICD-10-CM

## 2016-09-23 DIAGNOSIS — J181 Lobar pneumonia, unspecified organism: Secondary | ICD-10-CM

## 2016-09-23 DIAGNOSIS — J189 Pneumonia, unspecified organism: Secondary | ICD-10-CM

## 2016-09-23 MED ORDER — AMOXICILLIN 500 MG PO CAPS: 1000.0000 mg | ORAL_CAPSULE | Freq: Three times a day (TID) | ORAL | 0 refills | Status: DC

## 2016-09-23 MED ORDER — AMOXICILLIN 500 MG PO CAPS: 500.0000 mg | ORAL_CAPSULE | Freq: Three times a day (TID) | ORAL | 0 refills | Status: DC

## 2016-09-23 MED ORDER — HYDROCODONE-HOMATROPINE 5-1.5 MG/5ML PO SYRP: 5.0000 mL | ORAL_SOLUTION | Freq: Three times a day (TID) | ORAL | 0 refills | Status: DC | PRN

## 2016-09-23 MED ORDER — DOXYCYCLINE HYCLATE 100 MG PO TABS: 100.0000 mg | ORAL_TABLET | Freq: Two times a day (BID) | ORAL | 0 refills | Status: DC

## 2016-09-23 MED ORDER — ALBUTEROL SULFATE HFA 108 (90 BASE) MCG/ACT IN AERS: 2.0000 | INHALATION_SPRAY | Freq: Four times a day (QID) | RESPIRATORY_TRACT | 0 refills | Status: DC | PRN

## 2016-09-23 NOTE — Telephone Encounter (Signed)
Scheduled at 130pm to be seen

## 2016-09-23 NOTE — Telephone Encounter (Signed)
She needs to come back in this afternoon.  It is important that we see her back. Deliah Boston, MS, PA-C 8:52 AM, 09/23/2016

## 2016-09-23 NOTE — Progress Notes (Signed)
Subjective:    Brittney Bell is a 46 y.o. female who presents to PCP today for FU for PNA:  1.  PNA:  Patient Seen here on Monday. Diagnosed with pneumonia at that time. Physical exam revealed rales on right side. Question of left-sided pneumonia based on chest x-ray.  She is prescribed azithromycin at that visit. She started this medicine and noticed hives yesterday. Is it spread across her chest. No shortness of breath associated. No itching or tingling or swelling of lips or throat.  As for her pneumonia she feels this is worsening. She is having worsening cough that is also worse at night. Productive of thick greenish brown sputum. She doesn't think she's had any fevers or chills. She coughs just to walk for certain distance. No actual chest pain. No palpitations. She is eating and drinking well. No nausea or vomiting.    ROS as above per HPI.    The following portions of the patient's history were reviewed and updated as appropriate: allergies, current medications, past medical history, family and social history, and problem list. Patient is a former smoker.    Strong history of multiple bronchitis infections in the past when she smoked.    PMH reviewed.  Past Medical History:  Diagnosis Date  . Hypertension    Past Surgical History:  Procedure Laterality Date  . TUBAL LIGATION      Medications reviewed. Current Outpatient Prescriptions  Medication Sig Dispense Refill  . azithromycin (ZITHROMAX) 250 MG tablet Take 2 tabs PO x 1 dose, then 1 tab PO QD x 4 days 6 tablet 0  . metoprolol succinate (TOPROL-XL) 50 MG 24 hr tablet Take 1 tablet (50 mg total) by mouth daily. 90 tablet 1   No current facility-administered medications for this visit.      Objective:   Physical Exam BP 138/82 (BP Location: Right Arm, Patient Position: Sitting, Cuff Size: Normal)   Pulse 81   Temp 98.4 F (36.9 C) (Oral)   Resp 16   Ht  (1.676 m)   Wt 206 lb 9.6 oz (93.7 kg)   LMP  08/30/2016   SpO2 98%   BMI 33.35 kg/m  Gen:  Alert, cooperative patient who appears stated age in no acute distress.  Vital signs reviewed. HEENT: EOMI,  MMM Cardiac: Minimally tachycardic, regular rhythm.  Pulm:  Good WOB on left with few scattered wheezes.  Rhonchi and wheezes noted Right chest throughout all lung fields.   Abd:  Soft/nondistended/nontender.   Exts: Non edematous BL  LE, warm and well perfused.   Results for orders placed or performed in visit on 09/21/16 (from the past 72 hour(s))  Basic metabolic panel     Status: None   Collection Time: 09/21/16 11:53 AM  Result Value Ref Range   Glucose 83 65 - 99 mg/dL   BUN 7 6 - 24 mg/dL   Creatinine, Ser 4.09 0.57 - 1.00 mg/dL   GFR calc non Af Amer 101 >59 mL/min/1.73   GFR calc Af Amer 116 >59 mL/min/1.73   BUN/Creatinine Ratio 10 9 - 23   Sodium 137 134 - 144 mmol/L   Potassium 3.9 3.5 - 5.2 mmol/L   Chloride 99 96 - 106 mmol/L   CO2 21 18 - 29 mmol/L   Calcium 9.2 8.7 - 10.2 mg/dL  POCT CBC     Status: Abnormal   Collection Time: 09/21/16 12:04 PM  Result Value Ref Range   WBC 10.2 4.6 - 10.2 K/uL  Lymph, poc 2.3 0.6 - 3.4   POC LYMPH PERCENT 22.1 10 - 50 %L   MID (cbc) 0.7 0 - 0.9   POC MID % 7.2 0 - 12 %M   POC Granulocyte 7.2 (A) 2 - 6.9   Granulocyte percent 70.7 37 - 80 %G   RBC 4.13 4.04 - 5.48 M/uL   Hemoglobin 13.1 12.2 - 16.2 g/dL   HCT, POC 72.5 (A) 36.6 - 47.9 %   MCV 90.3 80 - 97 fL   MCH, POC 31.8 (A) 27 - 31.2 pg   MCHC 35.2 31.8 - 35.4 g/dL   RDW, POC 44.0 %   Platelet Count, POC 393 142 - 424 K/uL   MPV 6.2 0 - 99.8 fL    Imp/Plan: 1.  CAP: - More noticeable today on chest x-ray then on Monday. -Community acquired pneumonia. -Cannot treat with azithromycin and therefore we are going to do a combination of amoxicillin plus doxycycline. -I would prefer to use a fluoroquinolone for just one pill but she states that Levaquin causes palpitations. -See instructions.  -albuterol for cough  and presumed asthma exacerbation/perhaps COPD exacerbation that she does not have a diagnosis of this. She is a former smoker.-  - Hycodan for cough. - FU in 1 week to assess for improvement.  By Saturday if no improvement, sooner if worsening.   #2. Hives secondary to azithromycin: -She should stop this. -I have added azithromycin to her allergy list.

## 2016-09-23 NOTE — Patient Instructions (Addendum)
  You do have worsening pneumonia.  Stop taking the azithromycin. I added this to your allergy list because of the hives.  I want you to take amoxicillin 1 tab 3 times a day. Also take the doxycycline twice a day. Both of these are antibiotics and you'll take them for the next 7 days.  If you're not feeling better by Saturday you need to come back and see me. I will be hear that day.  If you starts feeling worse despite the antibiotics don't wait and come back sooner.  Take the cough syrup at night to help with sleep and cough. You can also use the albuterol inhaler every 6-8 hours for trouble with cough and wheezing.   IF you received an x-ray today, you will receive an invoice from [facility]. Please contact [facility] at [phone] with questions or concerns regarding your invoice.   IF you received labwork today, you will receive an invoice from [place]. Please contact [facility] at [phone] with questions or concerns regarding your invoice.   Our billing staff will not be able to assist you with questions regarding bills from these companies.  You will be contacted with the lab results as soon as they are available. The fastest way to get your results is to activate your My Chart account. Instructions are located on the last page of this paperwork. If you have not heard from [place] regarding the results in 2 weeks, please contact this office.    We recommend that you schedule a mammogram for breast cancer screening. Typically, you do not need a referral to do this. Please contact a local imaging center to schedule your mammogram.  [facility] - [phone]  *ask for the Radiology Department [facility]) - [phone] or [phone]  [facility] - [phone] [facility] - [phone] [facility] - [phone]  *ask for the Radiology Department [facility] - [phone]  *ask for the Radiology Department [facility] - [phone]  *ask for the Mammography Department [facility] - [phone]

## 2016-09-25 ENCOUNTER — Ambulatory Visit (INDEPENDENT_AMBULATORY_CARE_PROVIDER_SITE_OTHER): Payer: BLUE CROSS/BLUE SHIELD

## 2016-09-25 ENCOUNTER — Encounter (HOSPITAL_COMMUNITY): Payer: Self-pay | Admitting: Family Medicine

## 2016-09-25 ENCOUNTER — Ambulatory Visit (HOSPITAL_COMMUNITY)
Admission: EM | Admit: 2016-09-25 | Discharge: 2016-09-25 | Disposition: A | Discharge status: Home / Self Care | Payer: BLUE CROSS/BLUE SHIELD | Attending: Family Medicine | Admitting: Family Medicine

## 2016-09-25 DIAGNOSIS — R059 Cough, unspecified: Secondary | ICD-10-CM

## 2016-09-25 DIAGNOSIS — J189 Pneumonia, unspecified organism: Secondary | ICD-10-CM | POA: Diagnosis not present

## 2016-09-25 DIAGNOSIS — R0602 Shortness of breath: Secondary | ICD-10-CM | POA: Diagnosis not present

## 2016-09-25 DIAGNOSIS — I10 Essential (primary) hypertension: Secondary | ICD-10-CM | POA: Diagnosis not present

## 2016-09-25 DIAGNOSIS — Z87891 Personal history of nicotine dependence: Secondary | ICD-10-CM | POA: Insufficient documentation

## 2016-09-25 DIAGNOSIS — J181 Lobar pneumonia, unspecified organism: Secondary | ICD-10-CM

## 2016-09-25 DIAGNOSIS — R918 Other nonspecific abnormal finding of lung field: Secondary | ICD-10-CM | POA: Diagnosis not present

## 2016-09-25 DIAGNOSIS — R05 Cough: Secondary | ICD-10-CM | POA: Diagnosis not present

## 2016-09-25 LAB — CBC
HCT: 39.2 % (ref 36.0–46.0)
Hemoglobin: 13.4 g/dL (ref 12.0–15.0)
MCH: 31.1 pg (ref 26.0–34.0)
MCHC: 34.2 g/dL (ref 30.0–36.0)
MCV: 91 fL (ref 78.0–100.0)
Platelets: 471 10*3/uL — ABNORMAL HIGH (ref 150–400)
RBC: 4.31 MIL/uL (ref 3.87–5.11)
RDW: 11.3 % — ABNORMAL LOW (ref 11.5–15.5)
WBC: 10.7 10*3/uL — ABNORMAL HIGH (ref 4.0–10.5)

## 2016-09-25 LAB — POCT I-STAT, CHEM 8
BUN: 9 mg/dL (ref 6–20)
Calcium, Ion: 1.16 mmol/L (ref 1.15–1.40)
Chloride: 102 mmol/L (ref 101–111)
Creatinine, Ser: 0.6 mg/dL (ref 0.44–1.00)
Glucose, Bld: 95 mg/dL (ref 65–99)
HCT: 42 % (ref 36.0–46.0)
Hemoglobin: 14.3 g/dL (ref 12.0–15.0)
Potassium: 4 mmol/L (ref 3.5–5.1)
Sodium: 137 mmol/L (ref 135–145)
TCO2: 25 mmol/L (ref 0–100)

## 2016-09-25 MED ORDER — ALBUTEROL SULFATE (2.5 MG/3ML) 0.083% IN NEBU
INHALATION_SOLUTION | RESPIRATORY_TRACT | Status: AC
  Filled 2016-09-25: qty 3

## 2016-09-25 MED ORDER — PREDNISONE 10 MG (21) PO TBPK: ORAL_TABLET | ORAL | 0 refills | Status: DC

## 2016-09-25 MED ORDER — BENZONATATE 100 MG PO CAPS: 200.0000 mg | ORAL_CAPSULE | Freq: Three times a day (TID) | ORAL | 0 refills | Status: DC | PRN

## 2016-09-25 MED ORDER — ALBUTEROL SULFATE (2.5 MG/3ML) 0.083% IN NEBU
2.5000 mg | INHALATION_SOLUTION | Freq: Once | RESPIRATORY_TRACT | Status: AC
  Administered 2016-09-25: 2.5 mg via RESPIRATORY_TRACT

## 2016-09-25 NOTE — ED Provider Notes (Signed)
CSN: [id number]     Arrival date & time 09/25/16  1850 History   None    Chief Complaint  Patient presents with  . Pneumonia   (Consider location/radiation/quality/duration/timing/severity/associated sxs/prior Treatment) Patient c/o shortness of breath, she c/o having fever of 100.0, she c/o DOE and worsening pneumonia.  She states she was seen 3 days ago and rx'd zpak.  She states her chest broke out and then she was prescribed amoxicillin and doxycycline.  She states she is also taking hycodan cough syrup.  She reports severe coughing.    The history is provided by the patient.  Pneumonia  This is a new problem. The problem occurs constantly. The problem has not changed since onset.Associated symptoms include shortness of breath. Nothing aggravates the symptoms. Nothing relieves the symptoms. She has tried nothing for the symptoms.    Past Medical History:  Diagnosis Date  . Hypertension    Past Surgical History:  Procedure Laterality Date  . TUBAL LIGATION     Family History  Problem Relation Age of Onset  . Heart disease Mother   . Hypertension Mother    Social History  Substance Use Topics  . Smoking status: Former Smoker    Quit date: 11/30/2015  . Smokeless tobacco: Never Used  . Alcohol use 0.0 oz/week   OB History    No data available     Review of Systems  Constitutional: Positive for fatigue and fever.  HENT: Negative.   Eyes: Negative.   Respiratory: Positive for cough and shortness of breath.   Cardiovascular: Negative.   Gastrointestinal: Negative.   Endocrine: Negative.   Genitourinary: Negative.   Musculoskeletal: Negative.   Skin: Negative.   Allergic/Immunologic: Negative.   Neurological: Negative.   Hematological: Negative.   Psychiatric/Behavioral: Negative.     Allergies  Azithromycin and Levaquin [levofloxacin]  Home Medications   Prior to Admission medications   Medication Sig Start Date End Date Taking? Authorizing Provider   albuterol (PROVENTIL HFA;VENTOLIN HFA) 108 (90 Base) MCG/ACT inhaler Inhale 2 puffs into the lungs every 6 (six) hours as needed for wheezing or shortness of breath. 09/23/16   Tobey Grim, MD  amoxicillin (AMOXIL) 500 MG capsule Take 2 capsules (1,000 mg total) by mouth 3 (three) times daily. X 7 days 09/23/16   Tobey Grim, MD  azithromycin [facility]) 250 MG tablet Take 2 tabs PO x 1 dose, then 1 tab PO QD x 4 days 09/21/16 09/26/16  Ofilia Neas, PA-C  benzonatate (TESSALON) 100 MG capsule Take 2 capsules (200 mg total) by mouth 3 (three) times daily as needed for cough. 09/25/16   Deatra Canter, FNP  doxycycline (VIBRA-TABS) 100 MG tablet Take 1 tablet (100 mg total) by mouth 2 (two) times daily. 09/23/16   Tobey Grim, MD  HYDROcodone-homatropine [facility]) 5-1.5 MG/5ML syrup Take 5 mLs by mouth every 8 (eight) hours as needed for cough. 09/23/16   Tobey Grim, MD  metoprolol succinate (TOPROL-XL) 50 MG 24 hr tablet Take 1 tablet (50 mg total) by mouth daily. 11/29/15   Peyton Najjar, MD  predniSONE (STERAPRED UNI-PAK 21 TAB) 10 MG (21) TBPK tablet Take 6-5-4-3-2-1 po qd 09/25/16   Deatra Canter, FNP   Meds Ordered and Administered this Visit   Medications  albuterol (PROVENTIL) (2.5 MG/3ML) 0.083% nebulizer solution 2.5 mg (2.5 mg Nebulization Given 09/25/16 1956)    BP 98/63   Temp 98.6 F (37 C)   Resp 18   LMP  08/30/2016   SpO2 98%  No data found.   Physical Exam  Constitutional: She is oriented to person, place, and time. She appears well-developed and well-nourished.  HENT:  Head: Normocephalic and atraumatic.  Right Ear: External ear normal.  Left Ear: External ear normal.  Mouth/Throat: Oropharynx is clear and moist.  Eyes: Conjunctivae and EOM are normal. Pupils are equal, round, and reactive to light.  Neck: Normal range of motion. Neck supple.  Cardiovascular: Normal rate, regular rhythm and normal heart sounds.   Pulmonary/Chest: Effort normal.  She has wheezes. She has rales.  Right lower lobe with rales and rhonchi  Abdominal: Soft. Bowel sounds are normal.  Musculoskeletal: Normal range of motion.  Neurological: She is alert and oriented to person, place, and time.  Nursing note and vitals reviewed.   Urgent Care Course     Procedures (including critical care time)  Labs Review Labs Reviewed  CBC - Abnormal; Notable for the following:       Result Value   WBC 10.7 (*)    RDW 11.3 (*)    Platelets 471 (*)    All other components within normal limits  POCT I-STAT, CHEM 8    Imaging Review Dg Chest 2 View  Result Date: 09/25/2016 CLINICAL DATA:  Recent diagnosis of pneumonia. EXAM: CHEST  2 VIEW COMPARISON:  September 23, 2016 FINDINGS: Persisted opacity in the right lung base, unchanged. No other interval changes or acute abnormalities. IMPRESSION: Persistent right basilar opacity. Recommend follow-up to resolution. Electronically Signed   By: Gerome Sam III M.D   On: 09/25/2016 20:22     Visual Acuity Review  Right Eye Distance:   Left Eye Distance:   Bilateral Distance:    Right Eye Near:   Left Eye Near:    Bilateral Near:         MDM   1. Pneumonia of right lower lobe due to infectious organism (HCC)   2. Cough    CXR with right lower lobe pneumonia CBC wnl Istat wnl  Explained not failing out patient tx and this is day #3 of tx. Recommend a follow up xray in 4 weeks   Neb tx now Add tessalon perles and prednisone taper Continue current abx's and continue albuterol MDI and hycodan cough syrup at HS Follow up with pcp in 3 days  May follow up here prn  Push po fluids, rest, tylenol and motrin otc prn as directed for fever, arthralgias, and myalgias.  Follow up prn if sx's continue or persist.    Deatra Canter, FNP 09/25/16 2101

## 2016-09-25 NOTE — ED Triage Notes (Signed)
Pt here for worsening PNA. sts she is taking meds and not better.

## 2017-05-15 ENCOUNTER — Other Ambulatory Visit: Payer: Self-pay

## 2017-05-15 ENCOUNTER — Ambulatory Visit: Payer: BLUE CROSS/BLUE SHIELD | Admitting: Family Medicine

## 2017-05-15 ENCOUNTER — Encounter: Payer: Self-pay | Admitting: Family Medicine

## 2017-05-15 VITALS — BP 158/94 | HR 92 | Temp 98.2°F | Wt 212.8 lb

## 2017-05-15 DIAGNOSIS — I10 Essential (primary) hypertension: Secondary | ICD-10-CM | POA: Insufficient documentation

## 2017-05-15 DIAGNOSIS — G8929 Other chronic pain: Secondary | ICD-10-CM | POA: Diagnosis not present

## 2017-05-15 DIAGNOSIS — Z23 Encounter for immunization: Secondary | ICD-10-CM | POA: Diagnosis not present

## 2017-05-15 DIAGNOSIS — M545 Low back pain, unspecified: Secondary | ICD-10-CM

## 2017-05-15 HISTORY — DX: Essential (primary) hypertension: I10

## 2017-05-15 MED ORDER — TIZANIDINE HCL 4 MG PO CAPS: 4.0000 mg | ORAL_CAPSULE | Freq: Three times a day (TID) | ORAL | 0 refills | Status: DC

## 2017-05-15 MED ORDER — MELOXICAM 15 MG PO TABS
15.0000 mg | ORAL_TABLET | Freq: Every day | ORAL | 0 refills | Status: DC
Start: 2017-05-15 — End: 2017-06-03

## 2017-05-15 MED ORDER — AMLODIPINE BESYLATE 5 MG PO TABS: 5.0000 mg | ORAL_TABLET | Freq: Every day | ORAL | 3 refills | Status: DC

## 2017-05-15 NOTE — Progress Notes (Signed)
12/15/20183:03 PM  Brittney Bell Jan 04, 1971, 46 y.o. female [id number]  Chief Complaint  Patient presents with  . Hip Pain    HAS PAIN IN LEFT. ASLO NEEDS REFILLS ON BP MEDS    HPI:   Patient is a 46 y.o. female with past medical history significant for HTN who presents today for left low back/hip pain on and off for past several months. Tylenol, ice, heat helps. Pain does not radiate. Feels tight, as if something is going to snap. No falls, trauma.    Patient has not taken metoprolol in about 2 months, was making her too tired, felt like a zoombie  [facility] 05/15/2017 09/23/2016 09/21/2016  Decreased Interest 0 0 0  Down, Depressed, Hopeless 0 0 0  PHQ - 2 Score 0 0 0  Altered sleeping - - -  Tired, decreased energy - - -  Change in appetite - - -  Feeling bad or failure about yourself  - - -  Trouble concentrating - - -  Moving slowly or fidgety/restless - - -  Suicidal thoughts - - -  PHQ-9 Score - - -  Difficult doing work/chores - - -    Allergies  Allergen Reactions  . Azithromycin Hives  . Levaquin [Levofloxacin] Palpitations    Prior to Admission medications   Medication Sig Start Date End Date Taking? Authorizing Provider  metoprolol succinate (TOPROL-XL) 50 MG 24 hr tablet Take 1 tablet (50 mg total) by mouth daily. 11/29/15  Yes Peyton NajjarHopper, David H, MD    Past Medical History:  Diagnosis Date  . Hypertension     Past Surgical History:  Procedure Laterality Date  . TUBAL LIGATION      Social History   Tobacco Use  . Smoking status: Former Smoker    Last attempt to quit: 11/30/2015    Years since quitting: 1.4  . Smokeless tobacco: Never Used  Substance Use Topics  . Alcohol use: Yes    Alcohol/week: 0.0 oz    Family History  Problem Relation Age of Onset  . Heart disease Mother   . Hypertension Mother     ROS Per hpi  OBJECTIVE:  Blood pressure (!) 158/94, pulse 92, temperature 98.2 F (36.8 C), temperature source  Oral, weight 212 lb 12.8 oz (96.5 kg), SpO2 100 %.  Physical Exam  Constitutional: She is oriented to person, place, and time and well-developed, well-nourished, and in no distress.  Musculoskeletal:       Left hip: Normal.       Lumbar back: She exhibits tenderness and spasm. She exhibits no bony tenderness.  Neurological: She is alert and oriented to person, place, and time. She has a normal Straight Leg Raise Test. Gait normal.  Reflex Scores:      Patellar reflexes are 2+ on the right side and 2+ on the left side.      Achilles reflexes are 2+ on the right side and 2+ on the left side. Psychiatric: Her mood appears anxious.  Affect is congruent     ASSESSMENT and PLAN  1. Chronic left-sided low back pain without sciatica Discussed supportive measures, new meds r/se/b and RTC precautions. Patient educational handout given. - tiZANidine (ZANAFLEX) 4 MG capsule; Take 1 capsule (4 mg total) by mouth 3 (three) times daily. - meloxicam (MOBIC) 15 MG tablet; Take 1 tablet (15 mg total) by mouth daily.  2. Flu vaccine need - Flu Vaccine QUAD 36+ mos IM  3. Essential hypertension, benign Stop  metoprolol as having side effects, transitioning to amlodipine.  - amLODipine (NORVASC) 5 MG tablet; Take 1 tablet (5 mg total) by mouth daily.  Return in about 4 weeks (around 06/12/2017) for HTN.    Myles LippsIrma M Santiago, MD [facility] [address] [place], [place] [address] Ph.  [phone] Fax [phone]

## 2017-05-15 NOTE — Patient Instructions (Addendum)
IF you received an x-ray today, you will receive an invoice from [facility]. Please contact [facility] at [phone] with questions or concerns regarding your invoice.   IF you received labwork today, you will receive an invoice from [facility]. Please contact [facility] at [phone] with questions or concerns regarding your invoice.   Our billing staff will not be able to assist you with questions regarding bills from these companies.  You will be contacted with the lab results as soon as they are available. The fastest way to get your results is to activate your My Chart account. Instructions are located on the last page of this paperwork. If you have not heard from us regarding the results in 2 weeks, please contact this office.     Low Back Strain Rehab Ask your health care provider which exercises are safe for you. Do exercises exactly as told by your health care provider and adjust them as directed. It is normal to feel mild stretching, pulling, tightness, or discomfort as you do these exercises, but you should stop right away if you feel sudden pain or your pain gets worse. Do not begin these exercises until told by your health care provider. Stretching and range of motion exercises These exercises warm up your muscles and joints and improve the movement and flexibility of your back. These exercises also help to relieve pain, numbness, and tingling. Exercise A: Single knee to chest  1. Lie on your back on a firm surface with both legs straight. 2. Bend one of your knees. Use your hands to move your knee up toward your chest until you feel a gentle stretch in your lower back and buttock. ? Hold your leg in this position by holding onto the front of your knee. ? Keep your other leg as straight as possible. 3. Hold for __________ seconds. 4. Slowly return to the starting position. 5. Repeat with your other leg. Repeat __________ times. Complete this exercise  __________ times a day. Exercise B: Prone extension on elbows  1. Lie on your abdomen on a firm surface. 2. Prop yourself up on your elbows. 3. Use your arms to help lift your chest up until you feel a gentle stretch in your abdomen and your lower back. ? This will place some of your body weight on your elbows. If this is uncomfortable, try stacking pillows under your chest. ? Your hips should stay down, against the surface that you are lying on. Keep your hip and back muscles relaxed. 4. Hold for __________ seconds. 5. Slowly relax your upper body and return to the starting position. Repeat __________ times. Complete this exercise __________ times a day. Strengthening exercises These exercises build strength and endurance in your back. Endurance is the ability to use your muscles for a long time, even after they get tired. Exercise C: Pelvic tilt 1. Lie on your back on a firm surface. Bend your knees and keep your feet flat. 2. Tense your abdominal muscles. Tip your pelvis up toward the ceiling and flatten your lower back into the floor. ? To help with this exercise, you may place a small towel under your lower back and try to push your back into the towel. 3. Hold for __________ seconds. 4. Let your muscles relax completely before you repeat this exercise. Repeat __________ times. Complete this exercise __________ times a day. Exercise D: Alternating arm and leg raises  1. Get on your hands and knees on a firm surface. If you are on   a hard floor, you may want to use padding to cushion your knees, such as an exercise mat. 2. Line up your arms and legs. Your hands should be below your shoulders, and your knees should be below your hips. 3. Lift your left leg behind you. At the same time, raise your right arm and straighten it in front of you. ? Do not lift your leg higher than your hip. ? Do not lift your arm higher than your shoulder. ? Keep your abdominal and back muscles tight. ? Keep  your hips facing the ground. ? Do not arch your back. ? Keep your balance carefully, and do not hold your breath. 4. Hold for __________ seconds. 5. Slowly return to the starting position and repeat with your right leg and your left arm. Repeat __________ times. Complete this exercise __________times a day. Exercise J: Single leg lower with bent knees 1. Lie on your back on a firm surface. 2. Tense your abdominal muscles and lift your feet off the floor, one foot at a time, so your knees and hips are bent in an "L" shape (at about 90 degrees). ? Your knees should be over your hips and your lower legs should be parallel to the floor. 3. Keeping your abdominal muscles tense and your knee bent, slowly lower one of your legs so your toe touches the ground. 4. Lift your leg back up to return to the starting position. ? Do not hold your breath. ? Do not let your back arch. Keep your back flat against the ground. 5. Repeat with your other leg. Repeat __________ times. Complete this exercise __________ times a day. Posture and body mechanics  Body mechanics refers to the movements and positions of your body while you do your daily activities. Posture is part of body mechanics. Good posture and healthy body mechanics can help to relieve stress in your body's tissues and joints. Good posture means that your spine is in its natural S-curve position (your spine is neutral), your shoulders are pulled back slightly, and your head is not tipped forward. The following are general guidelines for applying improved posture and body mechanics to your everyday activities. Standing   When standing, keep your spine neutral and your feet about hip-width apart. Keep a slight bend in your knees. Your ears, shoulders, and hips should line up.  When you do a task in which you stand in one place for a long time, place one foot up on a stable object that is 2-4 inches (5-10 cm) high, such as a footstool. This helps keep  your spine neutral. Sitting   When sitting, keep your spine neutral and keep your feet flat on the floor. Use a footrest, if necessary, and keep your thighs parallel to the floor. Avoid rounding your shoulders, and avoid tilting your head forward.  When working at a desk or a computer, keep your desk at a height where your hands are slightly lower than your elbows. Slide your chair under your desk so you are close enough to maintain good posture.  When working at a computer, place your monitor at a height where you are looking straight ahead and you do not have to tilt your head forward or downward to look at the screen. Resting   When lying down and resting, avoid positions that are most painful for you.  If you have pain with activities such as sitting, bending, stooping, or squatting (flexion-based activities), lie in a position in which your body   does not bend very much. For example, avoid curling up on your side with your arms and knees near your chest (fetal position).  If you have pain with activities such as standing for a long time or reaching with your arms (extension-based activities), lie with your spine in a neutral position and bend your knees slightly. Try the following positions: ? Lying on your side with a pillow between your knees. ? Lying on your back with a pillow under your knees. Lifting   When lifting objects, keep your feet at least shoulder-width apart and tighten your abdominal muscles.  Bend your knees and hips and keep your spine neutral. It is important to lift using the strength of your legs, not your back. Do not lock your knees straight out.  Always ask for help to lift heavy or awkward objects. This information is not intended to replace advice given to you by your health care provider. Make sure you discuss any questions you have with your health care provider. Document Released: 05/18/2005 Document Revised: 01/23/2016 Document Reviewed:  02/27/2015 Elsevier Interactive Patient Education  2018 [employer] Inc[employer]  

## 2017-05-20 ENCOUNTER — Encounter: Payer: Self-pay | Admitting: Family Medicine

## 2017-05-20 ENCOUNTER — Ambulatory Visit: Payer: BLUE CROSS/BLUE SHIELD | Admitting: Family Medicine

## 2017-05-20 ENCOUNTER — Other Ambulatory Visit: Payer: Self-pay

## 2017-05-20 ENCOUNTER — Ambulatory Visit: Payer: Self-pay

## 2017-05-20 VITALS — BP 164/82 | HR 93 | Temp 98.4°F | Wt 214.0 lb

## 2017-05-20 DIAGNOSIS — I10 Essential (primary) hypertension: Secondary | ICD-10-CM

## 2017-05-20 DIAGNOSIS — T887XXA Unspecified adverse effect of drug or medicament, initial encounter: Secondary | ICD-10-CM

## 2017-05-20 DIAGNOSIS — M545 Low back pain: Secondary | ICD-10-CM

## 2017-05-20 DIAGNOSIS — G8929 Other chronic pain: Secondary | ICD-10-CM

## 2017-05-20 DIAGNOSIS — F411 Generalized anxiety disorder: Secondary | ICD-10-CM | POA: Diagnosis not present

## 2017-05-20 HISTORY — DX: Generalized anxiety disorder: F41.1

## 2017-05-20 MED ORDER — METOPROLOL SUCCINATE ER 25 MG PO TB24: 25.0000 mg | ORAL_TABLET | Freq: Every day | ORAL | 3 refills | Status: DC

## 2017-05-20 MED ORDER — METHOCARBAMOL 500 MG PO TABS: 500.0000 mg | ORAL_TABLET | Freq: Four times a day (QID) | ORAL | 0 refills | Status: DC

## 2017-05-20 MED ORDER — FLUOXETINE HCL 20 MG PO TABS: 20.0000 mg | ORAL_TABLET | Freq: Every day | ORAL | 3 refills | Status: DC

## 2017-05-20 NOTE — Progress Notes (Signed)
12/20/20183:38 PM  Brittney CannerMichelle E Bell 12-10-1970, 46 y.o. female [id number]  Chief Complaint  Patient presents with  . Follow-up    BP MEDS AND SWELLING IN THE NECK    HPI:   Patient is a [age] y.o. female with past medical history significant for HTN who presents today for followup.  She had to stop tizanidine that was given for LBP w sciatica due to hypotension, 80/50s. Otherwise she was transitioned from metoprolol to amlodipine due to feeling drained with BB. She is tolerating new BP medication well. Denies any side effects. She however has noticed return of palpitations for which BB was originally started. She reports benign cardiac workup in the past.  She is also struggling with anxiety, long standing issues, normally copes with LFM, unable to do so of recent. She has also noticed a fullness in her neck of recent, denies felling any discrete bumps, denies any pain or skin changes. LBP getting better, but still feels really tight band as if it were to "snap" along left low back. She is stretching and using heat.   [facility] 05/20/2017 05/15/2017 09/23/2016  Decreased Interest 0 0 0  Down, Depressed, Hopeless 0 0 0  PHQ - 2 Score 0 0 0  Altered sleeping - - -  Tired, decreased energy - - -  Change in appetite - - -  Feeling bad or failure about yourself  - - -  Trouble concentrating - - -  Moving slowly or fidgety/restless - - -  Suicidal thoughts - - -  PHQ-9 Score - - -  Difficult doing work/chores - - -    Allergies  Allergen Reactions  . Azithromycin Hives  . Levaquin [Levofloxacin] Palpitations    Prior to Admission medications   Medication Sig Start Date End Date Taking? Authorizing Provider  amLODipine (NORVASC) 5 MG tablet Take 1 tablet (5 mg total) by mouth daily. 05/15/17  Yes Myles LippsSantiago, Jovane Foutz M, MD  meloxicam (MOBIC) 15 MG tablet Take 1 tablet (15 mg total) by mouth daily. 05/15/17  Yes Myles LippsSantiago, Gimena Buick M, MD  tiZANidine (ZANAFLEX) 4 MG capsule  Take 1 capsule (4 mg total) by mouth 3 (three) times daily. 05/15/17  Yes Myles LippsSantiago, Cecia Egge M, MD    Past Medical History:  Diagnosis Date  . Hypertension     Past Surgical History:  Procedure Laterality Date  . TUBAL LIGATION      Social History   Tobacco Use  . Smoking status: Former Smoker    Last attempt to quit: 11/30/2015    Years since quitting: 1.4  . Smokeless tobacco: Never Used  Substance Use Topics  . Alcohol use: Yes    Alcohol/week: 0.0 oz    Family History  Problem Relation Age of Onset  . Heart disease Mother   . Hypertension Mother     Review of Systems  Constitutional: Negative for chills, diaphoresis, fever and malaise/fatigue.  Respiratory: Negative for cough and shortness of breath.   Cardiovascular: Positive for palpitations. Negative for chest pain and leg swelling.  Gastrointestinal: Negative for abdominal pain, nausea and vomiting.   Per hpi  OBJECTIVE:  Blood pressure (!) 164/82, pulse 93, temperature 98.4 F (36.9 C), temperature source Oral, weight 214 lb (97.1 kg), SpO2 99 %.  Physical Exam  Constitutional: She is oriented to person, place, and time and well-developed, well-nourished, and in no distress.  HENT:  Head: Normocephalic and atraumatic.  Mouth/Throat: Oropharynx is clear and moist. No oropharyngeal exudate.  Eyes:  EOM are normal. Pupils are equal, round, and reactive to light. No scleral icterus.  Neck: Neck supple. No edema and no erythema present. No thyromegaly present.  Cardiovascular: Normal rate, regular rhythm and normal heart sounds. Exam reveals no gallop and no friction rub.  No murmur heard. Pulmonary/Chest: Effort normal and breath sounds normal. She has no wheezes. She has no rales.  Musculoskeletal: She exhibits no edema.  Neurological: She is alert and oriented to person, place, and time. Gait normal.  Skin: Skin is warm and dry.    ASSESSMENT and PLAN 1. Essential hypertension, benign Above goal, adding  back low dose BB given return of palpitations which had been well controlled on BB before - CBC - Comprehensive metabolic panel - TSH  2. Generalized anxiety disorder New medication r/se/b discussed. Will titrate as needed. Cont with LFM - TSH  3. Non-dose-related adverse effect of medication, initial encounter  4. Chronic left-sided low back pain without sciatica Tizanidine added to allergy list, changing muscle relaxant.  Other orders - metoprolol succinate (TOPROL-XL) 25 MG 24 hr tablet; Take 1 tablet (25 mg total) by mouth daily. - FLUoxetine (PROZAC) 20 MG tablet; Take 1 tablet (20 mg total) by mouth daily. - methocarbamol (ROBAXIN) 500 MG tablet; Take 1 tablet (500 mg total) by mouth 4 (four) times daily.  Return in about 2 weeks (around 06/03/2017).    Myles LippsIrma M Santiago, MD [facility] [address] [place], [place] [address] Ph.  [phone] Fax [phone]

## 2017-05-20 NOTE — Patient Instructions (Signed)
     IF you received an x-ray today, you will receive an invoice from [facility]. Please contact [facility] at [phone] with questions or concerns regarding your invoice.   IF you received labwork today, you will receive an invoice from [facility]. Please contact [facility] at [phone] with questions or concerns regarding your invoice.   Our billing staff will not be able to assist you with questions regarding bills from these companies.  You will be contacted with the lab results as soon as they are available. The fastest way to get your results is to activate your My Chart account. Instructions are located on the last page of this paperwork. If you have not heard from us regarding the results in 2 weeks, please contact this office.     

## 2017-05-20 NOTE — Telephone Encounter (Signed)
Pt. reported side effects when starting Amlodipine and Zanaflex.  Reported taking 1st dose of each, simultaneously on Saturday evening.  Reported BP decreased to 80/50 and she became extremely thirsty, pupils became constricted, felt a little confused, and felt like she slurred her words, because her mouth was so dry.  Described feeling "like being high or drunk."  Reported holding the Zanaflex on Sun. and Mon. night, but took the Amlodipine by itself.  On Tues., took the medications separately, and tolerated okay.  On Wed., took both medications at same time, and then had reocurrance of the symptoms of extreme thirst, low BP (96/70), felt like she was high or drunk.  Reported symptoms lasted about 2 hrs.  Called pharmacist and was advised this reaction can occur a small percentage of the time with the 2 medications. Will send message to Dr. Leretha PolSantiago.     Also reported noting a soft, movable pea-sized lump on left neck, just above the collar bone; reported a localized puffiness in same site. Stated it is non-tender. Unable to tell if there is redness or warmth due to the fact that she is menopausal and feels warm and gets flushed intermittently.  Stated her left shoulder hurts when she wakes up in the mornings.  Reports she carries groceries, laundry, cases of bottled water, etc., up 2 flights of stairs at intervals.  Will sched appt. for evaluation.        Reason for Disposition . Caller has NON-URGENT medication question about med that PCP prescribed and triager unable to answer question  Answer Assessment - Initial Assessment Questions 1. SYMPTOMS: "Do you have any symptoms?"     C/o increased thirst, constricted pupils, mild confusion, slurring of words for approx. 2 hrs. on 2 episodes(Sat. And Wed.)  when taking Amlodipine and Zanaflex at same time. Described feeling like high or drunk.  Everything was a chore.  2. SEVERITY: If symptoms are present, ask "Are they mild, moderate or severe?"  Moderate - severe  Protocols used: MEDICATION QUESTION CALL-A-AH

## 2017-05-21 LAB — COMPREHENSIVE METABOLIC PANEL
ALT: 18 IU/L (ref 0–32)
AST: 18 IU/L (ref 0–40)
Albumin/Globulin Ratio: 1.6 (ref 1.2–2.2)
Albumin: 4.7 g/dL (ref 3.5–5.5)
Alkaline Phosphatase: 85 IU/L (ref 39–117)
BUN/Creatinine Ratio: 10 (ref 9–23)
BUN: 8 mg/dL (ref 6–24)
Bilirubin Total: 0.4 mg/dL (ref 0.0–1.2)
CO2: 20 mmol/L (ref 20–29)
Calcium: 9.5 mg/dL (ref 8.7–10.2)
Chloride: 104 mmol/L (ref 96–106)
Creatinine, Ser: 0.84 mg/dL (ref 0.57–1.00)
GFR calc Af Amer: 96 mL/min/{1.73_m2} (ref >59)
GFR calc non Af Amer: 83 mL/min/{1.73_m2} (ref >59)
Globulin, Total: 2.9 g/dL (ref 1.5–4.5)
Glucose: 91 mg/dL (ref 65–99)
Potassium: 4.1 mmol/L (ref 3.5–5.2)
Sodium: 138 mmol/L (ref 134–144)
Total Protein: 7.6 g/dL (ref 6.0–8.5)

## 2017-05-21 LAB — CBC
Hematocrit: 38.2 % (ref 34.0–46.6)
Hemoglobin: 12.6 g/dL (ref 11.1–15.9)
MCH: 29.9 pg (ref 26.6–33.0)
MCHC: 33 g/dL (ref 31.5–35.7)
MCV: 91 fL (ref 79–97)
Platelets: 363 10*3/uL (ref 150–379)
RBC: 4.21 x10E6/uL (ref 3.77–5.28)
RDW: 13.3 % (ref 12.3–15.4)
WBC: 8.3 10*3/uL (ref 3.4–10.8)

## 2017-05-21 LAB — TSH: TSH: 1.87 u[IU]/mL (ref 0.450–4.500)

## 2017-05-21 NOTE — Telephone Encounter (Signed)
Message forwarded to [place].

## 2017-05-21 NOTE — Telephone Encounter (Signed)
Patient seen in clinic yesterday, zanaflex added to her list of allergies due to severe hypotension.

## 2017-05-27 ENCOUNTER — Encounter: Payer: Self-pay | Admitting: Family Medicine

## 2017-06-03 ENCOUNTER — Ambulatory Visit (INDEPENDENT_AMBULATORY_CARE_PROVIDER_SITE_OTHER): Payer: BLUE CROSS/BLUE SHIELD | Admitting: Family Medicine

## 2017-06-03 ENCOUNTER — Other Ambulatory Visit: Payer: Self-pay

## 2017-06-03 ENCOUNTER — Encounter: Payer: Self-pay | Admitting: Family Medicine

## 2017-06-03 VITALS — BP 132/72 | HR 66 | Temp 97.7°F | Ht 67.72 in | Wt 210.0 lb

## 2017-06-03 DIAGNOSIS — F411 Generalized anxiety disorder: Secondary | ICD-10-CM | POA: Diagnosis not present

## 2017-06-03 DIAGNOSIS — G8929 Other chronic pain: Secondary | ICD-10-CM

## 2017-06-03 DIAGNOSIS — M545 Low back pain, unspecified: Secondary | ICD-10-CM

## 2017-06-03 DIAGNOSIS — I10 Essential (primary) hypertension: Secondary | ICD-10-CM | POA: Diagnosis not present

## 2017-06-03 MED ORDER — FLUOXETINE HCL 20 MG PO TABS: 20.0000 mg | ORAL_TABLET | Freq: Every day | ORAL | 5 refills | Status: DC

## 2017-06-03 MED ORDER — METOPROLOL SUCCINATE ER 25 MG PO TB24: 25.0000 mg | ORAL_TABLET | Freq: Every day | ORAL | 5 refills | Status: DC

## 2017-06-03 MED ORDER — MELOXICAM 15 MG PO TABS: 15.0000 mg | ORAL_TABLET | Freq: Every day | ORAL | 5 refills | Status: DC

## 2017-06-03 NOTE — Patient Instructions (Signed)
     IF you received an x-ray today, you will receive an invoice from [facility]. Please contact [facility] at [phone] with questions or concerns regarding your invoice.   IF you received labwork today, you will receive an invoice from [facility]. Please contact [facility] at [phone] with questions or concerns regarding your invoice.   Our billing staff will not be able to assist you with questions regarding bills from these companies.  You will be contacted with the lab results as soon as they are available. The fastest way to get your results is to activate your My Chart account. Instructions are located on the last page of this paperwork. If you have not heard from us regarding the results in 2 weeks, please contact this office.     

## 2017-06-03 NOTE — Progress Notes (Signed)
1/3/20194:48 PM  Brittney Bell 08-13-1970, 47 y.o. female [id number]  Chief Complaint  Patient presents with  . Follow-up    BP CHK    HPI:   Patient is a 47 y.o. female with past medical history significant for HTN and anxiety who presents today for follow-up.  Doing well back on BB, not feeling tired at lower dose, palpitations controlled, BP better. Also tolerating fluoxetine well, feels anxiety much better, would like to remain on current dose Back/hip pain getting better, meloxicam, ice and stretching helping. Requesting refill.  Has no acute concerns today  Depression screen [facility] 2/9 06/03/2017 05/20/2017 05/15/2017  Decreased Interest 0 0 0  Down, Depressed, Hopeless 0 0 0  PHQ - 2 Score 0 0 0  Altered sleeping - - -  Tired, decreased energy - - -  Change in appetite - - -  Feeling bad or failure about yourself  - - -  Trouble concentrating - - -  Moving slowly or fidgety/restless - - -  Suicidal thoughts - - -  PHQ-9 Score - - -  Difficult doing work/chores - - -    Allergies  Allergen Reactions  . Azithromycin Hives  . Tizanidine Other (See Comments)    Hypotension, severe 80/50s  . Levaquin [Levofloxacin] Palpitations    Prior to Admission medications   Medication Sig Start Date End Date Taking? Authorizing Provider  FLUoxetine (PROZAC) 20 MG tablet Take 1 tablet (20 mg total) by mouth daily. 05/20/17  Yes Myles Lipps, MD  meloxicam (MOBIC) 15 MG tablet Take 1 tablet (15 mg total) by mouth daily. 05/15/17  Yes Myles Lipps, MD  methocarbamol (ROBAXIN) 500 MG tablet Take 1 tablet (500 mg total) by mouth 4 (four) times daily. 05/20/17  Yes Myles Lipps, MD  metoprolol succinate (TOPROL-XL) 25 MG 24 hr tablet Take 1 tablet (25 mg total) by mouth daily. 05/20/17  Yes Myles Lipps, MD    Past Medical History:  Diagnosis Date  . Hypertension     Past Surgical History:  Procedure Laterality Date  . TUBAL LIGATION      Social  History   Tobacco Use  . Smoking status: Former Smoker    Last attempt to quit: 11/30/2015    Years since quitting: 1.5  . Smokeless tobacco: Never Used  Substance Use Topics  . Alcohol use: Yes    Alcohol/week: 0.0 oz    Family History  Problem Relation Age of Onset  . Heart disease Mother   . Hypertension Mother     [facility]  OBJECTIVE:  Blood pressure 132/72, pulse 66, temperature 97.7 F (36.5 C), temperature source Oral, height 5' 7.72" (1.72 m), weight 210 lb (95.3 kg), SpO2 98 %.  Physical Exam  Constitutional: She is oriented to person, place, and time and well-developed, well-nourished, and in no distress.  HENT:  Head: Normocephalic and atraumatic.  Mouth/Throat: Mucous membranes are normal.  Eyes: EOM are normal. Pupils are equal, round, and reactive to light. No scleral icterus.  Neck: Neck supple.  Pulmonary/Chest: Effort normal.  Neurological: She is alert and oriented to person, place, and time. Gait normal.  Skin: Skin is warm and dry.  Psychiatric: Mood and affect normal.  Nursing note and vitals reviewed.    ASSESSMENT and PLAN  1. Essential hypertension, benign  2. Generalized anxiety disorder  3. Chronic left-sided low back pain without sciatica  Overall doing well, above medical conditions well controlled on current regime. Refilling  meds.   Other orders - FLUoxetine (PROZAC) 20 MG tablet; Take 1 tablet (20 mg total) by mouth daily. - meloxicam (MOBIC) 15 MG tablet; Take 1 tablet (15 mg total) by mouth daily. - metoprolol succinate (TOPROL-XL) 25 MG 24 hr tablet; Take 1 tablet (25 mg total) by mouth daily.  Return in about 6 months (around 12/01/2017).    Myles LippsIrma M Santiago, MD [facility] [address] [place], [place] [address] Ph.  [phone] Fax [phone]

## 2017-07-08 DIAGNOSIS — J111 Influenza due to unidentified influenza virus with other respiratory manifestations: Secondary | ICD-10-CM | POA: Diagnosis not present

## 2017-07-09 ENCOUNTER — Ambulatory Visit: Payer: BLUE CROSS/BLUE SHIELD | Admitting: Family Medicine

## 2017-08-10 ENCOUNTER — Telehealth: Payer: Self-pay | Admitting: Family Medicine

## 2017-08-10 NOTE — Telephone Encounter (Signed)
Copied from CRM [id number]. Topic: Quick Communication - See Telephone Encounter >> Aug 10, 2017  4:58 PM Landry MellowFoltz, Melissa J wrote: CRM for notification. See Telephone encounter for:   08/10/17. Pt called - she needs to change FLUoxetine (PROZAC) 20 MG changed to capsules, so the pt can save money  Cb for pt is [phone]

## 2017-08-11 MED ORDER — FLUOXETINE HCL 20 MG PO CAPS
20.0000 mg | ORAL_CAPSULE | Freq: Every day | ORAL | 5 refills | Status: DC
Start: 2017-08-11 — End: 2017-12-07

## 2017-08-11 NOTE — Telephone Encounter (Signed)
Provider, ok to change to capsules instead of tablets? Please advise.

## 2017-08-11 NOTE — Telephone Encounter (Signed)
Sent capsules, patient notified via mychart.

## 2017-12-06 ENCOUNTER — Ambulatory Visit: Payer: BLUE CROSS/BLUE SHIELD | Admitting: Family Medicine

## 2017-12-07 ENCOUNTER — Encounter: Payer: Self-pay | Admitting: Family Medicine

## 2017-12-07 ENCOUNTER — Other Ambulatory Visit: Payer: Self-pay

## 2017-12-07 ENCOUNTER — Ambulatory Visit: Payer: BLUE CROSS/BLUE SHIELD | Admitting: Family Medicine

## 2017-12-07 ENCOUNTER — Ambulatory Visit (INDEPENDENT_AMBULATORY_CARE_PROVIDER_SITE_OTHER): Payer: BLUE CROSS/BLUE SHIELD | Admitting: Family Medicine

## 2017-12-07 VITALS — BP 128/84 | HR 74 | Temp 98.9°F | Ht 67.0 in | Wt 224.8 lb

## 2017-12-07 DIAGNOSIS — Z6835 Body mass index (BMI) 35.0-35.9, adult: Secondary | ICD-10-CM | POA: Diagnosis not present

## 2017-12-07 DIAGNOSIS — I10 Essential (primary) hypertension: Secondary | ICD-10-CM

## 2017-12-07 DIAGNOSIS — F411 Generalized anxiety disorder: Secondary | ICD-10-CM

## 2017-12-07 MED ORDER — FLUOXETINE HCL 40 MG PO CAPS: 40.0000 mg | ORAL_CAPSULE | Freq: Every day | ORAL | 1 refills | Status: DC

## 2017-12-07 MED ORDER — METOPROLOL SUCCINATE ER 25 MG PO TB24: 25.0000 mg | ORAL_TABLET | Freq: Every day | ORAL | 1 refills | Status: DC

## 2017-12-07 MED ORDER — MELOXICAM 15 MG PO TABS: 15.0000 mg | ORAL_TABLET | Freq: Every day | ORAL | 1 refills | Status: DC

## 2017-12-07 NOTE — Patient Instructions (Addendum)
IF you received an x-ray today, you will receive an invoice from [facility]. Please contact [facility] at [phone] with questions or concerns regarding your invoice.   IF you received labwork today, you will receive an invoice from [facility]. Please contact [facility] at [phone] with questions or concerns regarding your invoice.   Our billing staff will not be able to assist you with questions regarding bills from these companies.  You will be contacted with the lab results as soon as they are available. The fastest way to get your results is to activate your My Chart account. Instructions are located on the last page of this paperwork. If you have not heard from us regarding the results in 2 weeks, please contact this office.     Calorie Counting for Weight Loss Calories are units of energy. Your body needs a certain amount of calories from food to keep you going throughout the day. When you eat more calories than your body needs, your body stores the extra calories as fat. When you eat fewer calories than your body needs, your body burns fat to get the energy it needs. Calorie counting means keeping track of how many calories you eat and drink each day. Calorie counting can be helpful if you need to lose weight. If you make sure to eat fewer calories than your body needs, you should lose weight. Ask your health care provider what a healthy weight is for you. For calorie counting to work, you will need to eat the right number of calories in a day in order to lose a healthy amount of weight per week. A dietitian can help you determine how many calories you need in a day and will give you suggestions on how to reach your calorie goal.  A healthy amount of weight to lose per week is usually 1-2 lb (0.5-0.9 kg). This usually means that your daily calorie intake should be reduced by 500-750 calories.  Eating 1,200 - 1,500 calories per day can help most women lose  weight.  Eating 1,500 - 1,800 calories per day can help most men lose weight.  What is my plan? My goal is to have ______1500____ calories per day. If I have this many calories per day, I should lose around ____1______ pounds per week.  What do I need to know about calorie counting? In order to meet your daily calorie goal, you will need to:  Find out how many calories are in each food you would like to eat. Try to do this before you eat.  Decide how much of the food you plan to eat.  Write down what you ate and how many calories it had. Doing this is called keeping a food log.  To successfully lose weight, it is important to balance calorie counting with a healthy lifestyle that includes regular activity. Aim for 150 minutes of moderate exercise (such as walking) or 75 minutes of vigorous exercise (such as running) each week. Where do I find calorie information?  The number of calories in a food can be found on a Nutrition Facts label. If a food does not have a Nutrition Facts label, try to look up the calories online or ask your dietitian for help. Remember that calories are listed per serving. If you choose to have more than one serving of a food, you will have to multiply the calories per serving by the amount of servings you plan to eat. For example, the label on a  package of bread might say that a serving size is 1 slice and that there are 90 calories in a serving. If you eat 1 slice, you will have eaten 90 calories. If you eat 2 slices, you will have eaten 180 calories. How do I keep a food log? Immediately after each meal, record the following information in your food log:  What you ate. Don't forget to include toppings, sauces, and other extras on the food.  How much you ate. This can be measured in cups, ounces, or number of items.  How many calories each food and drink had.  The total number of calories in the meal.  Keep your food log near you, such as in a small notebook  in your pocket, or use a mobile app or website. Some programs will calculate calories for you and show you how many calories you have left for the day to meet your goal. What are some calorie counting tips?  Use your calories on foods and drinks that will fill you up and not leave you hungry: ? Some examples of foods that fill you up are nuts and nut butters, vegetables, lean proteins, and high-fiber foods like whole grains. High-fiber foods are foods with more than 5 g fiber per serving. ? Drinks such as sodas, specialty coffee drinks, alcohol, and juices have a lot of calories, yet do not fill you up.  Eat nutritious foods and avoid empty calories. Empty calories are calories you get from foods or beverages that do not have many vitamins or protein, such as candy, sweets, and soda. It is better to have a nutritious high-calorie food (such as an avocado) than a food with few nutrients (such as a bag of chips).  Know how many calories are in the foods you eat most often. This will help you calculate calorie counts faster.  Pay attention to calories in drinks. Low-calorie drinks include water and unsweetened drinks.  Pay attention to nutrition labels for "low fat" or "fat free" foods. These foods sometimes have the same amount of calories or more calories than the full fat versions. They also often have added sugar, starch, or salt, to make up for flavor that was removed with the fat.  Find a way of tracking calories that works for you. Get creative. Try different apps or programs if writing down calories does not work for you. What are some portion control tips?  Know how many calories are in a serving. This will help you know how many servings of a certain food you can have.  Use a measuring cup to measure serving sizes. You could also try weighing out portions on a kitchen scale. With time, you will be able to estimate serving sizes for some foods.  Take some time to put servings of different  foods on your favorite plates, bowls, and cups so you know what a serving looks like.  Try not to eat straight from a bag or box. Doing this can lead to overeating. Put the amount you would like to eat in a cup or on a plate to make sure you are eating the right portion.  Use smaller plates, glasses, and bowls to prevent overeating.  Try not to multitask (for example, watch TV or use your computer) while eating. If it is time to eat, sit down at a table and enjoy your food. This will help you to know when you are full. It will also help you to be aware of what you  how much you are eating. What are tips for following this plan? Reading food labels   Check the calorie count compared to the serving size. The serving size may be smaller than what you are used to eating.  Check the source of the calories. Make sure the food you are eating is high in vitamins and protein and low in saturated and trans fats. Shopping   Read nutrition labels while you shop. This will help you make healthy decisions before you decide to purchase your food.  Make a grocery list and stick to it. Cooking   Try to cook your favorite foods in a healthier way. For example, try baking instead of frying.  Use low-fat dairy products. Meal planning   Use more fruits and vegetables. Half of your plate should be fruits and vegetables.  Include lean proteins like poultry and fish. How do I count calories when eating out?  Ask for smaller portion sizes.  Consider sharing an entree and sides instead of getting your own entree.  If you get your own entree, eat only half. Ask for a box at the beginning of your meal and put the rest of your entree in it so you are not tempted to eat it.  If calories are listed on the menu, choose the lower calorie options.  Choose dishes that include vegetables, fruits, whole grains, low-fat dairy products, and lean protein.  Choose items that are boiled, broiled, grilled, or  steamed. Stay away from items that are buttered, battered, fried, or served with cream sauce. Items labeled "crispy" are usually fried, unless stated otherwise.  Choose water, low-fat milk, unsweetened iced tea, or other drinks without added sugar. If you want an alcoholic beverage, choose a lower calorie option such as a glass of wine or light beer.  Ask for dressings, sauces, and syrups on the side. These are usually high in calories, so you should limit the amount you eat.  If you want a salad, choose a garden salad and ask for grilled meats. Avoid extra toppings like bacon, cheese, or fried items. Ask for the dressing on the side, or ask for olive oil and vinegar or lemon to use as dressing.  Estimate how many servings of a food you are given. For example, a serving of cooked rice is  cup or about the size of half a baseball. Knowing serving sizes will help you be aware of how much food you are eating at restaurants. The list below tells you how big or small some common portion sizes are based on everyday objects:  1 oz-4 stacked dice.  3 oz-1 deck of cards.  1 tsp-1 die.  1 Tbsp- a ping-pong ball.  2 Tbsp-1 ping-pong ball.   cup- baseball.  1 cup-1 baseball. Summary  Calorie counting means keeping track of how many calories you eat and drink each day. If you eat fewer calories than your body needs, you should lose weight.  A healthy amount of weight to lose per week is usually 1-2 lb (0.5-0.9 kg). This usually means reducing your daily calorie intake by 500-750 calories.  The number of calories in a food can be found on a Nutrition Facts label. If a food does not have a Nutrition Facts label, try to look up the calories online or ask your dietitian for help.  Use your calories on foods and drinks that will fill you up, and not on foods and drinks that will leave you hungry.  Use smaller plates, glasses,   bowls to prevent overeating. This information is not intended to  replace advice given to you by your health care provider. Make sure you discuss any questions you have with your health care provider. Document Released: 05/18/2005 Document Revised: 04/17/2016 Document Reviewed: 04/17/2016 Elsevier Interactive Patient Education  2018 Elsevier Inc.  

## 2017-12-07 NOTE — Progress Notes (Signed)
7/9/201911:02 AM  Brittney Bell 02-Oct-1970, 47 y.o. female [id number]  Chief Complaint  Patient presents with  . Hypertension    blood pressure check    HPI:   Patient is a 47 y.o. female with past medical history significant for HTN and anxiety who presents today for routine follow-up  Last seen about 6 months ago  1. htn - on metoprolol, doing well, taking medication as prescribed, tolerating medication without any side effects, has noticed heart rate does not go pass 70 but this is not interfering with her endurance  2. Anxiety - on prozac 20mg ., taking medication as prescribed.denies any side effects. Anxiety not well controlled. Reports problems with sleep and overeating. Tends to binge at night even if not hungry. Life continues to be stressful. She has also noticed worsening mood prior to her cycle, very irritable and short-tempered. She is not exercising as she was previously. She has gained weight. She has not resumed smoking.  3. she continues to take meloxicam prn for low back/hip pain. She reports conditions stable.   Fall Risk  12/07/2017 06/03/2017 05/20/2017 05/15/2017 09/23/2016  Falls in the past year? No No No No No     [facility] 2/9 12/07/2017 06/03/2017 05/20/2017  Decreased Interest 0 0 0  Down, Depressed, Hopeless 0 0 0  PHQ - 2 Score 0 0 0  Altered sleeping - - -  Tired, decreased energy - - -  Change in appetite - - -  Feeling bad or failure about yourself  - - -  Trouble concentrating - - -  Moving slowly or fidgety/restless - - -  Suicidal thoughts - - -  PHQ-9 Score - - -  Difficult doing work/chores - - -    Allergies  Allergen Reactions  . Azithromycin Hives  . Tizanidine Other (See Comments)    Hypotension, severe 80/50s  . Levaquin [Levofloxacin] Palpitations    Prior to Admission medications   Medication Sig Start Date End Date Taking? Authorizing Provider  FLUoxetine (PROZAC) 20 MG capsule Take 1 capsule (20 mg total) by  mouth daily. 08/11/17   Myles LippsSantiago, Mabelle Mungin M, MD  meloxicam (MOBIC) 15 MG tablet Take 1 tablet (15 mg total) by mouth daily. 06/03/17   Myles LippsSantiago, Myla Mauriello M, MD  metoprolol succinate (TOPROL-XL) 25 MG 24 hr tablet Take 1 tablet (25 mg total) by mouth daily. 06/03/17   Myles LippsSantiago, Chevelle Durr M, MD    Past Medical History:  Diagnosis Date  . Anxiety   . Hypertension   . Low back pain     Past Surgical History:  Procedure Laterality Date  . TUBAL LIGATION      Social History   Tobacco Use  . Smoking status: Former Smoker    Last attempt to quit: 11/30/2015    Years since quitting: 2.0  . Smokeless tobacco: Never Used  Substance Use Topics  . Alcohol use: Yes    Alcohol/week: 0.0 oz    Family History  Problem Relation Age of Onset  . Heart disease Mother   . Hypertension Mother     Review of Systems  Constitutional: Negative for chills and fever.  Respiratory: Negative for cough and shortness of breath.   Cardiovascular: Negative for chest pain, palpitations and leg swelling.  Gastrointestinal: Negative for abdominal pain, nausea and vomiting.  Musculoskeletal: Positive for back pain.  Neurological: Negative for tingling and focal weakness.  Psychiatric/Behavioral: Negative for depression and suicidal ideas. The patient is nervous/anxious.  OBJECTIVE:  Blood pressure 128/84, pulse 74, temperature 98.9 F (37.2 C), temperature source Oral, height 5\' 7"  (1.702 m), weight 224 lb 12.8 oz (102 kg), last menstrual period 12/04/2017, SpO2 100 %. Body mass index is 35.21 kg/m.   Wt Readings from Last 3 Encounters:  12/07/17 224 lb 12.8 oz (102 kg)  06/03/17 210 lb (95.3 kg)  05/20/17 214 lb (97.1 kg)    Physical Exam  Constitutional: She is oriented to person, place, and time. She appears well-developed and well-nourished.  HENT:  Head: Normocephalic and atraumatic.  Mouth/Throat: Oropharynx is clear and moist. No oropharyngeal exudate.  Eyes: Pupils are equal, round, and reactive to  light. EOM are normal. No scleral icterus.  Neck: Neck supple.  Cardiovascular: Normal rate, regular rhythm and normal heart sounds. Exam reveals no gallop and no friction rub.  No murmur heard. Pulmonary/Chest: Effort normal and breath sounds normal. She has no wheezes. She has no rales.  Musculoskeletal: She exhibits no edema.  Neurological: She is alert and oriented to person, place, and time.  Skin: Skin is warm and dry.  Psychiatric: She has a normal mood and affect.  Nursing note and vitals reviewed.   ASSESSMENT and PLAN  1. Essential hypertension, benign Controlled. Continue current regime.  - Basic metabolic panel  2. Generalized anxiety disorder Not controlled. Increasing fluoxetine to 40mg  once a day. Discussed coping mechanisms.   3. BMI 35.0-35.9,adult Discussed recent weight gain. Main trigger anxiety. Discussed dietary changes and exercise. She has done weight watchers in the past, has all the tools still, this system works for her. MI techniques used, patient contemplative.   Other orders - FLUoxetine (PROZAC) 40 MG capsule; Take 1 capsule (40 mg total) by mouth daily. - meloxicam (MOBIC) 15 MG tablet; Take 1 tablet (15 mg total) by mouth daily. - metoprolol succinate (TOPROL-XL) 25 MG 24 hr tablet; Take 1 tablet (25 mg total) by mouth daily.  Return in about 3 months (around 03/09/2018) for weight loss.    Myles Lipps, MD Primary Care at [facility] [address] [place], [place] [address] Ph.  [phone] Fax [phone]

## 2017-12-08 ENCOUNTER — Encounter: Payer: Self-pay | Admitting: Family Medicine

## 2017-12-08 LAB — BASIC METABOLIC PANEL
BUN/Creatinine Ratio: 10 (ref 9–23)
BUN: 8 mg/dL (ref 6–24)
CO2: 20 mmol/L (ref 20–29)
Calcium: 9.2 mg/dL (ref 8.7–10.2)
Chloride: 104 mmol/L (ref 96–106)
Creatinine, Ser: 0.77 mg/dL (ref 0.57–1.00)
GFR calc Af Amer: 106 mL/min/{1.73_m2} (ref >59)
GFR calc non Af Amer: 92 mL/min/{1.73_m2} (ref >59)
Glucose: 87 mg/dL (ref 65–99)
Potassium: 4.7 mmol/L (ref 3.5–5.2)
Sodium: 139 mmol/L (ref 134–144)

## 2017-12-09 ENCOUNTER — Ambulatory Visit: Payer: BLUE CROSS/BLUE SHIELD | Admitting: Family Medicine

## 2017-12-14 ENCOUNTER — Encounter: Payer: Self-pay | Admitting: Family Medicine

## 2018-03-04 DIAGNOSIS — Z23 Encounter for immunization: Secondary | ICD-10-CM | POA: Diagnosis not present

## 2018-03-08 ENCOUNTER — Other Ambulatory Visit: Payer: Self-pay

## 2018-03-08 ENCOUNTER — Encounter: Payer: Self-pay | Admitting: Family Medicine

## 2018-03-08 ENCOUNTER — Ambulatory Visit (INDEPENDENT_AMBULATORY_CARE_PROVIDER_SITE_OTHER): Payer: BLUE CROSS/BLUE SHIELD | Admitting: Family Medicine

## 2018-03-08 VITALS — BP 108/82 | HR 65 | Temp 97.9°F | Resp 16 | Ht 66.75 in | Wt 206.0 lb

## 2018-03-08 DIAGNOSIS — M545 Low back pain, unspecified: Secondary | ICD-10-CM

## 2018-03-08 DIAGNOSIS — G8929 Other chronic pain: Secondary | ICD-10-CM

## 2018-03-08 DIAGNOSIS — F411 Generalized anxiety disorder: Secondary | ICD-10-CM | POA: Diagnosis not present

## 2018-03-08 DIAGNOSIS — I10 Essential (primary) hypertension: Secondary | ICD-10-CM | POA: Diagnosis not present

## 2018-03-08 DIAGNOSIS — Z6832 Body mass index (BMI) 32.0-32.9, adult: Secondary | ICD-10-CM

## 2018-03-08 MED ORDER — METOPROLOL SUCCINATE ER 25 MG PO TB24: 12.5000 mg | ORAL_TABLET | Freq: Every day | ORAL | 1 refills | Status: DC

## 2018-03-08 MED ORDER — MELOXICAM 15 MG PO TABS: 15.0000 mg | ORAL_TABLET | Freq: Every day | ORAL | 1 refills | Status: DC

## 2018-03-08 MED ORDER — FLUOXETINE HCL 40 MG PO CAPS: 40.0000 mg | ORAL_CAPSULE | Freq: Every day | ORAL | 1 refills | Status: DC

## 2018-03-08 MED ORDER — CYCLOBENZAPRINE HCL 10 MG PO TABS: 5.0000 mg | ORAL_TABLET | Freq: Three times a day (TID) | ORAL | 1 refills | Status: DC | PRN

## 2018-03-08 NOTE — Progress Notes (Signed)
10/8/201912:02 PM  Brittney Bell 1971-01-17, 47 y.o. female [id number]  Chief Complaint  Patient presents with  . follow up    weight loss, per pt states "she's lost 18 pounds"    HPI:   Patient is a 47 y.o. female with past medical history significant for HTN and anxiety who presents today for followup  Doing it with co-workers Doing 2 protein shakes and eats a healthy meal, lots of water Calorie counting 1500 cal/day Weekends are frees Started in July Feels this is very sustainable Good support system  Has cut back on bp meds as she was feeling very tired and HR was in 50s  Anxiety doing really well on fluoxetine Takes it at night, sleep really well  Sciatica has been bothering her of recent due to recent move Requesting refill of flexeril as it does not affect her BP  She also takes meloxicam as needed    Fall Risk  03/08/2018 12/07/2017 06/03/2017 05/20/2017 05/15/2017  Falls in the past year? No No No No No     Depression screen [facility] 2/9 03/08/2018 12/07/2017 06/03/2017  Decreased Interest 0 0 0  Down, Depressed, Hopeless 0 0 0  PHQ - 2 Score 0 0 0  Altered sleeping - - -  Tired, decreased energy - - -  Change in appetite - - -  Feeling bad or failure about yourself  - - -  Trouble concentrating - - -  Moving slowly or fidgety/restless - - -  Suicidal thoughts - - -  PHQ-9 Score - - -  Difficult doing work/chores - - -    Allergies  Allergen Reactions  . Azithromycin Hives  . Tizanidine Other (See Comments)    Hypotension, severe 80/50s  . Levaquin [Levofloxacin] Palpitations    Prior to Admission medications   Medication Sig Start Date End Date Taking? Authorizing Provider  FLUoxetine (PROZAC) 40 MG capsule Take 1 capsule (40 mg total) by mouth daily. 12/07/17  Yes Myles Lipps, MD  meloxicam (MOBIC) 15 MG tablet Take 1 tablet (15 mg total) by mouth daily. 12/07/17  Yes Myles Lipps, MD  metoprolol succinate (TOPROL-XL) 25 MG 24 hr tablet Take 1  tablet (25 mg total) by mouth daily. 12/07/17  Yes Myles Lipps, MD    Past Medical History:  Diagnosis Date  . Anxiety   . Hypertension   . Low back pain     Past Surgical History:  Procedure Laterality Date  . TUBAL LIGATION      Social History   Tobacco Use  . Smoking status: Former Smoker    Last attempt to quit: 11/30/2015    Years since quitting: 2.2  . Smokeless tobacco: Never Used  Substance Use Topics  . Alcohol use: Yes    Alcohol/week: 0.0 standard drinks    Family History  Problem Relation Age of Onset  . Heart disease Mother   . Hypertension Mother     Review of Systems  Constitutional: Negative for chills and fever.  Respiratory: Negative for cough and shortness of breath.   Cardiovascular: Negative for chest pain, palpitations and leg swelling.  Gastrointestinal: Negative for abdominal pain, nausea and vomiting.  per hpi  OBJECTIVE:  Blood pressure 108/82, pulse 65, temperature 97.9 F (36.6 C), temperature source Oral, resp. rate 16, height 5' 6.75" (1.695 m), weight 206 lb (93.4 kg), last menstrual period 02/23/2018, SpO2 99 %. Body mass index is 32.51 kg/m.   Wt Readings from Last 3 Encounters:  03/08/18 206 lb (93.4 kg)  12/07/17 224 lb 12.8 oz (102 kg)  06/03/17 210 lb (95.3 kg)    Physical Exam  Constitutional: She is oriented to person, place, and time. She appears well-developed and well-nourished.  HENT:  Head: Normocephalic and atraumatic.  Mouth/Throat: Mucous membranes are normal.  Eyes: Pupils are equal, round, and reactive to light. Conjunctivae and EOM are normal. No scleral icterus.  Neck: Neck supple.  Pulmonary/Chest: Effort normal.  Neurological: She is alert and oriented to person, place, and time.  Skin: Skin is warm and dry.  Psychiatric: She has a normal mood and affect.  Nursing note and vitals reviewed.   ASSESSMENT and PLAN  1. Essential hypertension, benign Controlled. Continue current regime. Consider dc  at next visit if cont weight loss. SSX of concern reviewed.   2. Generalized anxiety disorder Controlled. Continue current regime.   3. Chronic left-sided low back pain without sciatica Recent flare up from moving, adding flexeril as requested. Cont to meloxicam prn.  4. BMI 32.0-32.9,adult Congratulated patient on sign LFM and weight loss. Continue with current LFM  Other orders - metoprolol succinate (TOPROL-XL) 25 MG 24 hr tablet; Take 0.5 tablets (12.5 mg total) by mouth daily. - cyclobenzaprine (FLEXERIL) 10 MG tablet; Take 0.5-1 tablets (5-10 mg total) by mouth 3 (three) times daily as needed for muscle spasms. - FLUoxetine (PROZAC) 40 MG capsule; Take 1 capsule (40 mg total) by mouth daily. - meloxicam (MOBIC) 15 MG tablet; Take 1 tablet (15 mg total) by mouth daily.  Return in about 3 months (around 06/08/2018).    Myles Lipps, MD Primary Care at [facility] [address] [place], [place] [address] Ph.  [phone] Fax [phone]

## 2018-03-08 NOTE — Patient Instructions (Signed)
° ° ° °  If you have lab work done today you will be contacted with your lab results within the next 2 weeks.  If you have not heard from us then please contact us. The fastest way to get your results is to register for My Chart. ° ° °IF you received an x-ray today, you will receive an invoice from [facility]. Please contact [facility] at [phone] with questions or concerns regarding your invoice.  ° °IF you received labwork today, you will receive an invoice from [facility]. Please contact [facility] at [phone] with questions or concerns regarding your invoice.  ° °Our billing staff will not be able to assist you with questions regarding bills from these companies. ° °You will be contacted with the lab results as soon as they are available. The fastest way to get your results is to activate your My Chart account. Instructions are located on the last page of this paperwork. If you have not heard from us regarding the results in 2 weeks, please contact this office. °  ° ° ° °

## 2018-06-16 ENCOUNTER — Ambulatory Visit: Payer: BLUE CROSS/BLUE SHIELD | Admitting: Family Medicine

## 2019-03-02 DIAGNOSIS — Z20828 Contact with and (suspected) exposure to other viral communicable diseases: Secondary | ICD-10-CM | POA: Diagnosis not present

## 2019-03-16 DIAGNOSIS — R300 Dysuria: Secondary | ICD-10-CM | POA: Diagnosis not present

## 2019-04-30 ENCOUNTER — Ambulatory Visit (HOSPITAL_COMMUNITY)
Admission: EM | Admit: 2019-04-30 | Discharge: 2019-04-30 | Disposition: A | Discharge status: Home / Self Care | Payer: BLUE CROSS/BLUE SHIELD | Attending: Family Medicine | Admitting: Family Medicine

## 2019-04-30 ENCOUNTER — Encounter (HOSPITAL_COMMUNITY): Payer: Self-pay

## 2019-04-30 ENCOUNTER — Other Ambulatory Visit: Payer: Self-pay

## 2019-04-30 DIAGNOSIS — Z79899 Other long term (current) drug therapy: Secondary | ICD-10-CM | POA: Insufficient documentation

## 2019-04-30 DIAGNOSIS — Z791 Long term (current) use of non-steroidal anti-inflammatories (NSAID): Secondary | ICD-10-CM | POA: Insufficient documentation

## 2019-04-30 DIAGNOSIS — J029 Acute pharyngitis, unspecified: Secondary | ICD-10-CM | POA: Diagnosis not present

## 2019-04-30 DIAGNOSIS — M542 Cervicalgia: Secondary | ICD-10-CM | POA: Insufficient documentation

## 2019-04-30 DIAGNOSIS — Z87891 Personal history of nicotine dependence: Secondary | ICD-10-CM | POA: Insufficient documentation

## 2019-04-30 DIAGNOSIS — Z20828 Contact with and (suspected) exposure to other viral communicable diseases: Secondary | ICD-10-CM | POA: Insufficient documentation

## 2019-04-30 DIAGNOSIS — F411 Generalized anxiety disorder: Secondary | ICD-10-CM | POA: Diagnosis not present

## 2019-04-30 DIAGNOSIS — I10 Essential (primary) hypertension: Secondary | ICD-10-CM | POA: Diagnosis not present

## 2019-04-30 LAB — POC SARS CORONAVIRUS 2 AG: SARS Coronavirus 2 Ag: NEGATIVE

## 2019-04-30 LAB — POCT RAPID STREP A: Streptococcus, Group A Screen (Direct): NEGATIVE

## 2019-04-30 LAB — POC SARS CORONAVIRUS 2 AG -  ED: SARS Coronavirus 2 Ag: NEGATIVE

## 2019-04-30 NOTE — Discharge Instructions (Signed)
Your rapid covid-19 test is negative.  As there can be false negatives, we are running a confirmation test. This should result in the next 2-3 days.  Self isolate until covid results are back and negative.  Will notify you by phone of any positive findings. Your negative results will be sent through your MyChart.      Push fluids to ensure adequate hydration and keep secretions thin.  Tylenol and/or ibuprofen as needed for pain or fevers.  Throat lozenges, gargles, chloraseptic spray, warm teas, popsicles etc to help with throat pain.   If symptoms worsen or do not improve in the next week to return to be seen or to follow up with your PCP.

## 2019-04-30 NOTE — ED Provider Notes (Signed)
[facility]    CSN: [id number] Arrival date & time: 04/30/19  1624      History   Chief Complaint Chief Complaint  Patient presents with  . Sore Throat    HPI Brittney Bell is a 48 y.o. female.   Brittney Bell presents with complaints of sore throat. "glands" started hurting. Throat felt dry. Started two days ago. Tenderness to neck this morning. Pain with swallowing. Can feel it in her ears. Temp of 99 last night. Chills last night and today. Vomited last night, small amount, felt like sinus drainage. No outward nasal drainage. Body aches. No headache. No cough. No rash. No nausea, no diarrhea. No known ill contacts. Denies any previous similar in the past. Took advil and tylenol which have helped with symptoms. Took tylenol pm around 2p this afternoon.     ROS per HPI, negative if not otherwise mentioned.      Past Medical History:  Diagnosis Date  . Anxiety   . Hypertension   . Low back pain     Patient Active Problem List   Diagnosis Date Noted  . Generalized anxiety disorder 05/20/2017  . Essential hypertension, benign 05/15/2017    Past Surgical History:  Procedure Laterality Date  . TUBAL LIGATION      OB History   No obstetric history on file.      Home Medications    Prior to Admission medications   Medication Sig Start Date End Date Taking? Authorizing Provider  cyclobenzaprine (FLEXERIL) 10 MG tablet Take 0.5-1 tablets (5-10 mg total) by mouth 3 (three) times daily as needed for muscle spasms. 03/08/18   Rutherford Guys, MD  FLUoxetine (PROZAC) 40 MG capsule Take 1 capsule (40 mg total) by mouth daily. 03/08/18   Rutherford Guys, MD  meloxicam (MOBIC) 15 MG tablet Take 1 tablet (15 mg total) by mouth daily. 03/08/18   Rutherford Guys, MD  metoprolol succinate (TOPROL-XL) 25 MG 24 hr tablet Take 0.5 tablets (12.5 mg total) by mouth daily. 03/08/18   Rutherford Guys, MD    Family History Family History  Problem Relation Age  of Onset  . Heart disease Mother   . Hypertension Mother     Social History Social History   Tobacco Use  . Smoking status: Former Smoker    Quit date: 11/30/2015    Years since quitting: 3.4  . Smokeless tobacco: Never Used  Substance Use Topics  . Alcohol use: Yes    Alcohol/week: 0.0 standard drinks  . Drug use: No     Allergies   Azithromycin, Tizanidine, and Levaquin [levofloxacin]   Review of Systems Review of Systems   Physical Exam Triage Vital Signs ED Triage Vitals  Enc Vitals Group     BP 04/30/19 1655 139/83     Pulse Rate 04/30/19 1655 90     Resp 04/30/19 1655 17     Temp 04/30/19 1655 99.3 F (37.4 C)     Temp Source 04/30/19 1655 Oral     SpO2 04/30/19 1655 100 %     Weight --      Height --      Head Circumference --      Peak Flow --      Pain Score 04/30/19 1657 6     Pain Loc --      Pain Edu? --      Excl. in [place]? --    No data found.  Updated Vital Signs  BP 139/83 (BP Location: Left Arm)   Pulse 90   Temp 99.3 F (37.4 C) (Oral)   Resp 17   SpO2 100%    Physical Exam Constitutional:      General: She is not in acute distress.    Appearance: She is well-developed.  HENT:     Mouth/Throat:     Pharynx: Posterior oropharyngeal erythema present.     Tonsils: 1+ on the right. 1+ on the left.  Cardiovascular:     Rate and Rhythm: Normal rate.     Heart sounds: Normal heart sounds.  Pulmonary:     Effort: Pulmonary effort is normal.  Lymphadenopathy:     Cervical: Cervical adenopathy present.  Skin:    General: Skin is warm and dry.  Neurological:     Mental Status: She is alert and oriented to person, place, and time.      UC Treatments / Results  Labs (all labs ordered are listed, but only abnormal results are displayed) Labs Reviewed  CULTURE, GROUP A STREP ([facility])  NOVEL CORONAVIRUS, NAA (HOSP ORDER, SEND-OUT TO REF LAB; TAT 18-24 HRS)  POC SARS CORONAVIRUS 2 AG -  ED  POCT RAPID STREP A  POC SARS CORONAVIRUS 2  AG    EKG   Radiology No results found.  Procedures Procedures (including critical care time)  Medications Ordered in UC Medications - No data to display  Initial Impression / Assessment and Plan / UC Course  I have reviewed the triage vital signs and the nursing notes.  Pertinent labs & imaging results that were available during my care of the patient were reviewed by me and considered in my medical decision making (see chart for details).     Non toxic. Benign physical exam.  Afebrile. No increased work of breathing. No difficulty swallowing or managing secretions. History and physical consistent with viral illness.  Supportive cares recommended. Negative strep and negative rapid covid tonight. Return precautions provided. Patient verbalized understanding and agreeable to plan.    Final Clinical Impressions(s) / UC Diagnoses   Final diagnoses:  Pharyngitis, unspecified etiology     Discharge Instructions     Your rapid covid-19 test is negative.  As there can be false negatives, we are running a confirmation test. This should result in the next 2-3 days.  Self isolate until covid results are back and negative.  Will notify you by phone of any positive findings. Your negative results will be sent through your MyChart.      Push fluids to ensure adequate hydration and keep secretions thin.  Tylenol and/or ibuprofen as needed for pain or fevers.  Throat lozenges, gargles, chloraseptic spray, warm teas, popsicles etc to help with throat pain.   If symptoms worsen or do not improve in the next week to return to be seen or to follow up with your PCP.      ED Prescriptions    None     PDMP not reviewed this encounter.   Georgetta Haber, NP 04/30/19 1816

## 2019-04-30 NOTE — ED Triage Notes (Signed)
Pt presents with sore throat X 2 days. 

## 2019-05-01 ENCOUNTER — Emergency Department
Admission: EM | Admit: 2019-05-01 | Discharge: 2019-05-01 | Disposition: A | Discharge status: Home / Self Care | Payer: BLUE CROSS/BLUE SHIELD | Source: Home / Self Care | Attending: Family Medicine | Admitting: Family Medicine

## 2019-05-01 ENCOUNTER — Ambulatory Visit
Admission: RE | Admit: 2019-05-01 | Discharge: 2019-05-01 | Disposition: A | Discharge status: Home / Self Care | Payer: BLUE CROSS/BLUE SHIELD | Source: Ambulatory Visit

## 2019-05-01 ENCOUNTER — Other Ambulatory Visit: Payer: Self-pay

## 2019-05-01 DIAGNOSIS — J029 Acute pharyngitis, unspecified: Secondary | ICD-10-CM

## 2019-05-01 MED ORDER — PENICILLIN V POTASSIUM 500 MG PO TABS: ORAL_TABLET | ORAL | 0 refills | Status: DC

## 2019-05-01 NOTE — Discharge Instructions (Addendum)
Try warm salt water gargles for sore throat.  ?May take Ibuprofen 200mg, 4 tabs every 8 hours with food.  ?

## 2019-05-01 NOTE — Discharge Instructions (Addendum)
Follow-up with your primary care provider or go to the Urgent Care to be seen in person.

## 2019-05-01 NOTE — ED Triage Notes (Signed)
Pt was seen last night in [facility] for sore throat. Rapid strep was neg. Send out still pending. Also has covid pending. Pt noticed white spots on tonsils today. Taking ibuprofen prn.

## 2019-05-01 NOTE — ED Provider Notes (Signed)
[facility]    CSN: [id number] Arrival date & time: 05/01/19  1511      History   Chief Complaint Chief Complaint  Patient presents with  . Sore Throat  . Chills    HPI Brittney Bell is a 48 y.o. female.   Patient developed soreness in her left neck four days ago, followed by fever the next day and fatigue and myalgias yesterday.  She denies nasal congestion or cough.  She had fever to 101.2 this morning. This morning she noticed white spots on her tonsils.  She denies chest tightness, shortness of breath, and changes in taste/smell. She was seen in [place] urgent care last night, where rapid strep was negative and send out culture is pending.   The history is provided by the patient.    Past Medical History:  Diagnosis Date  . Anxiety   . Hypertension   . Low back pain     Patient Active Problem List   Diagnosis Date Noted  . Generalized anxiety disorder 05/20/2017  . Essential hypertension, benign 05/15/2017    Past Surgical History:  Procedure Laterality Date  . TUBAL LIGATION      OB History   No obstetric history on file.      Home Medications    Prior to Admission medications   Medication Sig Start Date End Date Taking? Authorizing Provider  cyclobenzaprine (FLEXERIL) 10 MG tablet Take 0.5-1 tablets (5-10 mg total) by mouth 3 (three) times daily as needed for muscle spasms. 03/08/18   Myles Lipps, MD  FLUoxetine (PROZAC) 40 MG capsule Take 1 capsule (40 mg total) by mouth daily. 03/08/18   Myles Lipps, MD  meloxicam (MOBIC) 15 MG tablet Take 1 tablet (15 mg total) by mouth daily. 03/08/18   Myles Lipps, MD  metoprolol succinate (TOPROL-XL) 25 MG 24 hr tablet Take 0.5 tablets (12.5 mg total) by mouth daily. 03/08/18   Myles Lipps, MD  penicillin v potassium (VEETID) 500 MG tablet Take one tab by mouth twice daily for 10 days 05/01/19   Lattie Haw, MD    Family History Family History  Problem Relation Age of  Onset  . Heart disease Mother   . Hypertension Mother     Social History Social History   Tobacco Use  . Smoking status: Former Smoker    Quit date: 11/30/2015    Years since quitting: 3.4  . Smokeless tobacco: Never Used  Substance Use Topics  . Alcohol use: Yes    Alcohol/week: 0.0 standard drinks  . Drug use: No     Allergies   Azithromycin, Tizanidine, and Levaquin [levofloxacin]   Review of Systems Review of Systems + sore throat No cough No pleuritic pain No wheezing No nasal congestion No post-nasal drainage No sinus pain/pressure No itchy/red eyes ? left earache No hemoptysis No SOB + fever/chills + nausea resolved + vomiting resolved No abdominal pain No diarrhea No urinary symptoms No skin rash + fatigue + myalgias No headache   Physical Exam Triage Vital Signs ED Triage Vitals  Enc Vitals Group     BP 05/01/19 1711 140/84     Pulse Rate 05/01/19 1711 97     Resp 05/01/19 1711 18     Temp 05/01/19 1711 98.7 F (37.1 C)     Temp Source 05/01/19 1711 Oral     SpO2 05/01/19 1711 100 %     Weight 05/01/19 1712 215 lb (97.5 kg)  Height 05/01/19 1712 5\' 7"  (1.702 m)     Head Circumference --      Peak Flow --      Pain Score --      Pain Loc --      Pain Edu? --      Excl. in [place]? --    No data found.  Updated Vital Signs BP 140/84 (BP Location: Left Arm)   Pulse 97   Temp 98.7 F (37.1 C) (Oral)   Resp 18   Ht 5\' 7"  (1.702 m)   Wt 97.5 kg   SpO2 100%   BMI 33.67 kg/m   Visual Acuity Right Eye Distance:   Left Eye Distance:   Bilateral Distance:    Right Eye Near:   Left Eye Near:    Bilateral Near:     Physical Exam Nursing notes and Vital Signs reviewed. Appearance:  Patient appears stated age, and in no acute distress Eyes:  Pupils are equal, round, and reactive to light and accomodation.  Extraocular movement is intact.  Conjunctivae are not inflamed  Ears:  Canals normal.  Tympanic membranes normal.  Nose:   Normal turbinates.  No sinus tenderness.    Pharynx:  Distinctly erythematous bilaterally with a small amount of exudate bilaterally Neck:  Supple.  Enlarged tender tonsillar nodes bilaterally.   Lungs:  Clear to auscultation.  Breath sounds are equal.  Moving air well. Heart:  Regular rate and rhythm without murmurs, rubs, or gallops.  Abdomen:  Nontender without masses or hepatosplenomegaly.  Bowel sounds are present.  No CVA or flank tenderness.  Extremities:  No edema.  Skin:  No rash present.   UC Treatments / Results  Labs (all labs ordered are listed, but only abnormal results are displayed) Labs Reviewed - No data to display  EKG   Radiology No results found.  Procedures Procedures (including critical care time)  Medications Ordered in UC Medications - No data to display  Initial Impression / Assessment and Plan / UC Course  I have reviewed the triage vital signs and the nursing notes.  Pertinent labs & imaging results that were available during my care of the patient were reviewed by me and considered in my medical decision making (see chart for details).    CENTOR 4; suspect strep pharyngitis.  Will begin Pen VK for 10 days. Followup with Family Doctor if not improved in 10 days.   Final Clinical Impressions(s) / UC Diagnoses   Final diagnoses:  Pharyngitis, unspecified etiology     Discharge Instructions     Try warm salt water gargles for sore throat.  May take Ibuprofen 200mg , 4 tabs every 8 hours with food.     ED Prescriptions    Medication Sig Dispense Auth. Provider   penicillin v potassium (VEETID) 500 MG tablet Take one tab by mouth twice daily for 10 days 20 tablet Kandra Nicolas, MD        Kandra Nicolas, MD 05/04/19 [id number]

## 2019-05-01 NOTE — ED Provider Notes (Signed)
Virtual Visit via Video Note:  Brittney Bell  initiated request for Telemedicine visit with [facility] Urgent Care team. I connected with Jackelyn Poling  on 05/01/2019 at 2:12 PM  for a synchronized telemedicine visit using a video enabled HIPPA compliant telemedicine application. I verified that I am speaking with Jackelyn Poling  using two identifiers. Sharion Balloon, NP  was physically located in a [facility] Urgent care site and KALYSE MEHARG was located at a different location.   The limitations of evaluation and management by telemedicine as well as the availability of in-person appointments were discussed. Patient was informed that she  may incur a bill ( including co-pay) for this virtual visit encounter. Brittney Bell  expressed understanding and gave verbal consent to proceed with virtual visit.     History of Present Illness:Brittney Bell  is a 48 y.o. female presents for evaluation of white patches on her throat x 1 day.  She was seen at the Urgent Care in [facility] yesterday; her rapid strep was negative; her rapid COVID was negative; her throat culture and PCR COVID are pending.  Patient is insistent that she wants an antibiotic prescribed today.  She states she has increased pain with swallowing.   Allergies  Allergen Reactions  . Azithromycin Hives  . Tizanidine Other (See Comments)    Hypotension, severe 80/50s  . Levaquin [Levofloxacin] Palpitations     Past Medical History:  Diagnosis Date  . Anxiety   . Hypertension   . Low back pain      Social History   Tobacco Use  . Smoking status: Former Smoker    Quit date: 11/30/2015    Years since quitting: 3.4  . Smokeless tobacco: Never Used  Substance Use Topics  . Alcohol use: Yes    Alcohol/week: 0.0 standard drinks  . Drug use: No        Observations/Objective: Physical Exam  VITALS: Patient denies fever. GENERAL: Alert, appears well and in no acute distress. HEENT:  Atraumatic. NECK: Normal movements of the head and neck. CARDIOPULMONARY: No increased WOB. Speaking in clear sentences. I:E ratio WNL.  MS: Moves all visible extremities without noticeable abnormality. PSYCH: Pleasant and cooperative, well-groomed. Speech normal rate and rhythm. Affect is appropriate. Insight and judgement are appropriate. Attention is focused, linear, and appropriate.  NEURO: CN grossly intact. Oriented as arrived to appointment on time with no prompting. Moves both UE equally.    Assessment and Plan:    ICD-10-CM   1. Sore throat  J02.9        Follow Up Instructions: Instructed patient to be seen in person by her PCP or at the urgent care since her symptoms are worse today.  Discussed with her that an antibiotic prescribed over a video visit is not appropriate at this time.  Patient agrees to be seen in person.    I discussed the assessment and treatment plan with the patient. The patient was provided an opportunity to ask questions and all were answered. The patient agreed with the plan and demonstrated an understanding of the instructions.   The patient was advised to call back or seek an in-person evaluation if the symptoms worsen or if the condition fails to improve as anticipated.      Sharion Balloon, NP  05/01/2019 2:12 PM         Sharion Balloon, NP 05/01/19 1413

## 2019-05-02 LAB — NOVEL CORONAVIRUS, NAA (HOSP ORDER, SEND-OUT TO REF LAB; TAT 18-24 HRS): SARS-CoV-2, NAA: NOT DETECTED

## 2019-05-03 LAB — CULTURE, GROUP A STREP (THRC)

## 2019-09-13 ENCOUNTER — Other Ambulatory Visit: Payer: Self-pay

## 2019-09-13 ENCOUNTER — Ambulatory Visit (INDEPENDENT_AMBULATORY_CARE_PROVIDER_SITE_OTHER)
Admission: EM | Admit: 2019-09-13 | Discharge: 2019-09-13 | Disposition: A | Discharge status: Home / Self Care | Payer: BLUE CROSS/BLUE SHIELD | Source: Home / Self Care | Attending: Emergency Medicine | Admitting: Emergency Medicine

## 2019-09-13 ENCOUNTER — Emergency Department (HOSPITAL_COMMUNITY): Payer: BLUE CROSS/BLUE SHIELD

## 2019-09-13 ENCOUNTER — Emergency Department (HOSPITAL_COMMUNITY)
Admission: EM | Admit: 2019-09-13 | Discharge: 2019-09-14 | Disposition: A | Discharge status: Home / Self Care | Payer: BLUE CROSS/BLUE SHIELD | Attending: Emergency Medicine | Admitting: Emergency Medicine

## 2019-09-13 ENCOUNTER — Encounter (HOSPITAL_COMMUNITY): Payer: Self-pay

## 2019-09-13 DIAGNOSIS — R0789 Other chest pain: Secondary | ICD-10-CM | POA: Insufficient documentation

## 2019-09-13 DIAGNOSIS — R9431 Abnormal electrocardiogram [ECG] [EKG]: Secondary | ICD-10-CM | POA: Insufficient documentation

## 2019-09-13 DIAGNOSIS — Z87891 Personal history of nicotine dependence: Secondary | ICD-10-CM | POA: Diagnosis not present

## 2019-09-13 DIAGNOSIS — R000 Tachycardia, unspecified: Secondary | ICD-10-CM

## 2019-09-13 DIAGNOSIS — M79602 Pain in left arm: Secondary | ICD-10-CM | POA: Diagnosis not present

## 2019-09-13 DIAGNOSIS — I10 Essential (primary) hypertension: Secondary | ICD-10-CM | POA: Diagnosis not present

## 2019-09-13 DIAGNOSIS — R079 Chest pain, unspecified: Secondary | ICD-10-CM

## 2019-09-13 LAB — BASIC METABOLIC PANEL
Anion gap: 12 (ref 5–15)
BUN: 7 mg/dL (ref 6–20)
CO2: 22 mmol/L (ref 22–32)
Calcium: 9 mg/dL (ref 8.9–10.3)
Chloride: 103 mmol/L (ref 98–111)
Creatinine, Ser: 0.72 mg/dL (ref 0.44–1.00)
GFR calc Af Amer: >60 mL/min (ref >60)
GFR calc non Af Amer: >60 mL/min (ref >60)
Glucose, Bld: 102 mg/dL — ABNORMAL HIGH (ref 70–99)
Potassium: 3.5 mmol/L (ref 3.5–5.1)
Sodium: 137 mmol/L (ref 135–145)

## 2019-09-13 LAB — CBC
HCT: 34 % — ABNORMAL LOW (ref 36.0–46.0)
Hemoglobin: 10.2 g/dL — ABNORMAL LOW (ref 12.0–15.0)
MCH: 24 pg — ABNORMAL LOW (ref 26.0–34.0)
MCHC: 30 g/dL (ref 30.0–36.0)
MCV: 80 fL (ref 80.0–100.0)
Platelets: 398 10*3/uL (ref 150–400)
RBC: 4.25 MIL/uL (ref 3.87–5.11)
RDW: 14.6 % (ref 11.5–15.5)
WBC: 7.9 10*3/uL (ref 4.0–10.5)
nRBC: 0 % (ref 0.0–0.2)

## 2019-09-13 LAB — I-STAT BETA HCG BLOOD, ED (MC, WL, AP ONLY): I-stat hCG, quantitative: <5 m[IU]/mL (ref <5)

## 2019-09-13 LAB — TROPONIN I (HIGH SENSITIVITY)
Troponin I (High Sensitivity): 2 ng/L (ref <18)
Troponin I (High Sensitivity): 5 ng/L (ref <18)

## 2019-09-13 MED ORDER — SODIUM CHLORIDE 0.9% FLUSH: 3.0000 mL | Freq: Once | INTRAVENOUS | Status: DC

## 2019-09-13 NOTE — ED Notes (Signed)
Bed: UCTR Expected date:  Expected time:  Means of arrival:  Comments: Sick rooms 9 -10

## 2019-09-13 NOTE — ED Triage Notes (Signed)
Pt c/o irregular heartbeat for several months, worse today, "swimmy headed".  Pt c/o "sinking feeling in my chest"; "weird ache" to left arm/axilla and pain to left back/shoulder blade area.onset a couple hours ago.  Denies SOB, n/v, diaphoresis. Also c/o arm/numbness to left elbow intermittently over the past couple months; new "soreness" feeling today to lower arm.  Denies slurred speech, memory issues. Grips equal/strong, leg strength equal/strong; smile symmetrical.  EKG performed-DR. Mortenson notified of pt c/o of symptoms and EKG result. Advised pt to go to ER STAT to ensure not having an MI. Pt hesitant to go to ER, re-enforced need to go to ER now. Pt verbalized understanding and agreed to have boyfriend drive her to ER.

## 2019-09-13 NOTE — ED Triage Notes (Signed)
Pt reports that she feels like she has an irregular HR and pain in her L arm, sent here from UC, denies SOB or N/V, has been going on for the past few months.

## 2019-09-13 NOTE — ED Provider Notes (Signed)
HPI  SUBJECTIVE:  Brittney Bell is a 49 y.o. female who presents with  intermittent left-sided chest pain described as a "sinking feeling in my chest" with a "weird ache" radiating to her left arm/axilla and to her scapula starting several hours ago..  Reports left arm pain starting today.  She states that she feels as if her heart is "skipping a beat".  She has been having issues with this for the past several months but the chest pain with radiation down her left arm is new as of today.  She has a past medical history of hypertension.  She is a former smoker.  No history of MI, hypercholesterolemia, diabetes.  POE:UMPNTIR, No Pcp Per  Past Medical History:  Diagnosis Date  . Anxiety   . Hypertension   . Low back pain     Past Surgical History:  Procedure Laterality Date  . TUBAL LIGATION      Family History  Problem Relation Age of Onset  . Heart disease Mother   . Hypertension Mother     Social History   Tobacco Use  . Smoking status: Former Smoker    Quit date: 11/30/2015    Years since quitting: 3.7  . Smokeless tobacco: Never Used  Substance Use Topics  . Alcohol use: Yes    Alcohol/week: 0.0 standard drinks  . Drug use: No    No current facility-administered medications for this encounter.  Current Outpatient Medications:  .  cyclobenzaprine (FLEXERIL) 10 MG tablet, Take 0.5-1 tablets (5-10 mg total) by mouth 3 (three) times daily as needed for muscle spasms., Disp: 30 tablet, Rfl: 1 .  FLUoxetine (PROZAC) 40 MG capsule, Take 1 capsule (40 mg total) by mouth daily., Disp: 90 capsule, Rfl: 1 .  meloxicam (MOBIC) 15 MG tablet, Take 1 tablet (15 mg total) by mouth daily., Disp: 90 tablet, Rfl: 1 .  metoprolol succinate (TOPROL-XL) 25 MG 24 hr tablet, Take 0.5 tablets (12.5 mg total) by mouth daily., Disp: 45 tablet, Rfl: 1 .  penicillin v potassium (VEETID) 500 MG tablet, Take one tab by mouth twice daily for 10 days, Disp: 20 tablet, Rfl: 0  Allergies  Allergen  Reactions  . Azithromycin Hives  . Tizanidine Other (See Comments)    Hypotension, severe 80/50s  . Levaquin [Levofloxacin] Palpitations     ROS  As noted in HPI.   Physical Exam  BP (!) 162/81 (BP Location: Right Arm)   Pulse (!) 131   Temp 98.3 F (36.8 C) (Oral)   Resp 20   SpO2 100%   Constitutional: Well developed, well nourished, no acute distress Eyes:  EOMI, conjunctiva normal bilaterally HENT: Normocephalic, atraumatic,mucus membranes moist Respiratory: Normal inspiratory effort Cardiovascular: Tachycardic GI: nondistended skin: No rash, skin intact Musculoskeletal: no deformities Neurologic: Alert & oriented x 3, no focal neuro deficits Psychiatric: Speech and behavior appropriate   ED Course   Medications - No data to display  Orders Placed This Encounter  Procedures  . ED EKG    Standing Status:   Standing    Number of Occurrences:   1    Order Specific Question:   Reason for Exam    Answer:   Chest Pain  . EKG 12-Lead    Standing Status:   Standing    Number of Occurrences:   1    No results found for this or any previous visit (from the past 24 hour(s)). No results found.  ED Clinical Impression  1. Chest pain, unspecified  type   2. Tachycardia   3. Abnormal EKG      ED Assessment/Plan  EKG #1.  Sinus tachycardia 103 left axis deviation normal intervals.  No hypertrophy.  ST depression in 2.  EKG #2: Sinus tachycardia rate 115, ST depression in 2, V4, V5, V6.  No ST elevation.  Patient with abnormal EKG, tachycardia, hypertension and left-sided chest pain with left arm numbness.  Story concerning for ACS.  With the tachycardia PE also in the differential.  Discussed with patient that she needs additional work-up that requires ER evaluation.  She is currently asymptomatic.  Patient agrees to go to the ED.   No orders of the defined types were placed in this encounter.   *This clinic note was created using Dragon dictation software.  Therefore, there may be occasional mistakes despite careful proofreading.   ?    Melynda Ripple, MD 09/13/19 [phone]

## 2019-09-14 ENCOUNTER — Emergency Department (HOSPITAL_COMMUNITY): Payer: BLUE CROSS/BLUE SHIELD

## 2019-09-14 ENCOUNTER — Ambulatory Visit: Payer: BLUE CROSS/BLUE SHIELD | Admitting: Cardiology

## 2019-09-14 ENCOUNTER — Encounter: Payer: Self-pay | Admitting: Cardiology

## 2019-09-14 VITALS — BP 140/88 | HR 92

## 2019-09-14 DIAGNOSIS — E876 Hypokalemia: Secondary | ICD-10-CM | POA: Diagnosis not present

## 2019-09-14 DIAGNOSIS — R0789 Other chest pain: Secondary | ICD-10-CM | POA: Diagnosis not present

## 2019-09-14 DIAGNOSIS — I10 Essential (primary) hypertension: Secondary | ICD-10-CM | POA: Diagnosis not present

## 2019-09-14 DIAGNOSIS — I491 Atrial premature depolarization: Secondary | ICD-10-CM

## 2019-09-14 DIAGNOSIS — R002 Palpitations: Secondary | ICD-10-CM | POA: Diagnosis not present

## 2019-09-14 DIAGNOSIS — R9431 Abnormal electrocardiogram [ECG] [EKG]: Secondary | ICD-10-CM | POA: Diagnosis not present

## 2019-09-14 DIAGNOSIS — R200 Anesthesia of skin: Secondary | ICD-10-CM | POA: Diagnosis not present

## 2019-09-14 DIAGNOSIS — Z87891 Personal history of nicotine dependence: Secondary | ICD-10-CM | POA: Diagnosis not present

## 2019-09-14 DIAGNOSIS — M79602 Pain in left arm: Secondary | ICD-10-CM | POA: Diagnosis not present

## 2019-09-14 DIAGNOSIS — R079 Chest pain, unspecified: Secondary | ICD-10-CM | POA: Diagnosis not present

## 2019-09-14 LAB — D-DIMER, QUANTITATIVE: D-Dimer, Quant: 0.52 ug/mL-FEU — ABNORMAL HIGH (ref 0.00–0.50)

## 2019-09-14 LAB — TSH: TSH: 2.621 u[IU]/mL (ref 0.350–4.500)

## 2019-09-14 MED ORDER — POTASSIUM CHLORIDE CRYS ER 20 MEQ PO TBCR: 20.0000 meq | EXTENDED_RELEASE_TABLET | Freq: Every day | ORAL | 3 refills | Status: DC

## 2019-09-14 MED ORDER — IOHEXOL 350 MG/ML SOLN
75.0000 mL | Freq: Once | INTRAVENOUS | Status: AC | PRN
  Administered 2019-09-14: 08:00:00 75 mL via INTRAVENOUS

## 2019-09-14 MED ORDER — ACEBUTOLOL HCL 200 MG PO CAPS: 200.0000 mg | ORAL_CAPSULE | Freq: Every day | ORAL | 3 refills | Status: DC

## 2019-09-14 NOTE — Patient Instructions (Addendum)
Medication Instructions:  Your physician has recommended you make the following change in your medication:  START: Acebutolol 200 mg take one tablet by mouth daily.  START: Potassium chloride 20 meq take one tablet by mouth daily.  *If you need a refill on your cardiac medications before your next appointment, please call your pharmacy*   Lab Work: Your physician recommends that you return for lab work in: 2 weeks BMP, Magnesium If you have labs (blood work) drawn today and your tests are completely normal, you will receive your results only by: Marland Kitchen MyChart Message (if you have MyChart) OR . A paper copy in the mail If you have any lab test that is abnormal or we need to change your treatment, we will call you to review the results.   Testing/Procedures: None   Follow-Up: At [facility], you and your health needs are our priority.  As part of our continuing mission to provide you with exceptional heart care, we have created designated Provider Care Teams.  These Care Teams include your primary Cardiologist (physician) and Advanced Practice Providers (APPs -  Physician Assistants and Nurse Practitioners) who all work together to provide you with the care you need, when you need it.  We recommend signing up for the patient portal called "MyChart".  Sign up information is provided on this After Visit Summary.  MyChart is used to connect with patients for Virtual Visits (Telemedicine).  Patients are able to view lab/test results, encounter notes, upcoming appointments, etc.  Non-urgent messages can be sent to your provider as well.   To learn more about what you can do with MyChart, go to [email or web address].    Your next appointment:   1 month(s)  The format for your next appointment:   In Person  Provider:   Norman Herrlich, MD   Other Instructions [email or web address]        FDA-cleared, clinical grade mobile EKG monitor: Lourena Simmonds  is the most clinically-validated mobile EKG used by the world's leading cardiac care medical professionals With Basic service, know instantly if your heart rhythm is normal or if atrial fibrillation is detected, and email the last single EKG recording to yourself or your doctor Premium service, available for purchase through the Kardia app for $9.99 per month or $99 per year, includes unlimited history and storage of your EKG recordings, a monthly EKG summary report to share with your doctor, along with the ability to track your blood pressure, activity and weight Includes one KardiaMobile phone clip FREE SHIPPING: Standard delivery 1-3 business days. Orders placed by 11:00am PST will ship that afternoon. Otherwise, will ship next business day. All orders ship via [employer] from [place], [place]    1. Avoid all over-the-counter antihistamines except Claritin/Loratadine and Zyrtec/Cetrizine. 2. Avoid all combination including cold sinus allergies flu decongestant and sleep medications 3. You can use Robitussin DM Mucinex and Mucinex DM for cough. 4. can use Tylenol aspirin ibuprofen and naproxen but no combinations such as sleep or sinus.

## 2019-09-14 NOTE — ED Notes (Signed)
Patient c/o irregular heart rate for the past several months , states at times the irregularity wakes her up.c/o tingling under left arm and down into her hands. Admits to weekly alcohol use

## 2019-09-14 NOTE — ED Provider Notes (Signed)
[facility] EMERGENCY DEPARTMENT Provider Note   CSN: [id number] Arrival date & time: 09/13/19  1900     History Chief Complaint  Patient presents with  . Chest Pain    Brittney Bell is a 49 y.o. female with history of anxiety, hypertension presenting for evaluation of acute onset, intermittent palpitations for 2 weeks.  She reports feeling as though her heart is racing and that her heart rate is irregular.  She also reports associated substernal chest pains which radiate to the left side into the left shoulder and left upper extremity.  No aggravating or alleviating factors noted but she does note that she has been feeling short of breath with walking and exertion.  Occasionally feels lightheaded but denies syncope.  Denies abdominal pain, nausea, vomiting.  She is a former smoker, quit 4 years ago.  She denies recreational drug use or excessive alcohol intake.  Reports that her mother has a significant history of heart disease but otherwise does not know if she has any family history of heart disease under the age of 57.  She reports that when her symptoms come on she will take an aspirin with improvement in her symptoms.  She denies recent travel or surgeries, hemoptysis, prior history of DVT or PE, or hormone replacement therapy.  She was seen and evaluated at urgent care, noted to have an abnormal EKG and was sent to the ED for further evaluation.  The history is provided by the patient.       Past Medical History:  Diagnosis Date  . Anxiety   . Hypertension   . Low back pain     Patient Active Problem List   Diagnosis Date Noted  . Generalized anxiety disorder 05/20/2017  . Essential hypertension, benign 05/15/2017    Past Surgical History:  Procedure Laterality Date  . TUBAL LIGATION       OB History   No obstetric history on file.     Family History  Problem Relation Age of Onset  . Heart disease Mother   . Hypertension Mother     Social  History   Tobacco Use  . Smoking status: Former Smoker    Quit date: 11/30/2015    Years since quitting: 3.7  . Smokeless tobacco: Never Used  Substance Use Topics  . Alcohol use: Yes    Alcohol/week: 0.0 standard drinks  . Drug use: No    Home Medications Prior to Admission medications   Medication Sig Start Date End Date Taking? Authorizing Provider  acetaminophen (TYLENOL) 500 MG tablet Take 1,000-1,500 mg by mouth every 8 (eight) hours as needed for mild pain or headache.   Yes [provider]  ibuprofen (ADVIL) 200 MG tablet Take 600-800 mg by mouth every 6 (six) hours as needed for headache or moderate pain.   Yes [provider]  cyclobenzaprine (FLEXERIL) 10 MG tablet Take 0.5-1 tablets (5-10 mg total) by mouth 3 (three) times daily as needed for muscle spasms. Patient not taking: Reported on 09/14/2019 03/08/18   Rutherford Guys, MD  FLUoxetine (PROZAC) 40 MG capsule Take 1 capsule (40 mg total) by mouth daily. Patient not taking: Reported on 09/14/2019 03/08/18   Rutherford Guys, MD  meloxicam (MOBIC) 15 MG tablet Take 1 tablet (15 mg total) by mouth daily. Patient not taking: Reported on 09/14/2019 03/08/18   Rutherford Guys, MD  metoprolol succinate (TOPROL-XL) 25 MG 24 hr tablet Take 0.5 tablets (12.5 mg total) by mouth daily.  Patient not taking: Reported on 09/14/2019 03/08/18   Myles Lipps, MD  penicillin v potassium (VEETID) 500 MG tablet Take one tab by mouth twice daily for 10 days Patient not taking: Reported on 09/14/2019 05/01/19   Lattie Haw, MD    Allergies    Azithromycin, Tizanidine, and Levaquin [levofloxacin]  Review of Systems   Review of Systems  Constitutional: Negative for chills and fever.  Respiratory: Positive for shortness of breath.   Cardiovascular: Positive for chest pain and palpitations.  Gastrointestinal: Negative for abdominal pain, nausea and vomiting.  Neurological: Positive for light-headedness. Negative for  syncope.  All other systems reviewed and are negative.   Physical Exam Updated Vital Signs BP (!) 158/83 (BP Location: Right Arm)   Pulse 87   Temp 98.4 F (36.9 C) (Oral)   Resp 16   SpO2 100%   Physical Exam Vitals and nursing note reviewed.  Constitutional:      General: She is not in acute distress.    Appearance: She is well-developed.  HENT:     Head: Normocephalic and atraumatic.  Eyes:     General:        Right eye: No discharge.        Left eye: No discharge.     Conjunctiva/sclera: Conjunctivae normal.  Neck:     Vascular: No JVD.     Trachea: No tracheal deviation.  Cardiovascular:     Rate and Rhythm: Regular rhythm. Tachycardia present.     Pulses:          Radial pulses are 2+ on the right side and 2+ on the left side.       Dorsalis pedis pulses are 2+ on the right side and 2+ on the left side.       Posterior tibial pulses are 2+ on the right side and 2+ on the left side.     Comments:  Homans sign absent bilaterally, no lower extremity edema, no palpable cords, compartments are soft  Pulmonary:     Effort: Pulmonary effort is normal.     Breath sounds: Normal breath sounds.  Chest:     Chest wall: No tenderness.  Abdominal:     General: There is no distension.     Palpations: Abdomen is soft.  Musculoskeletal:     Cervical back: Normal range of motion and neck supple.     Right lower leg: No tenderness. No edema.     Left lower leg: No tenderness. No edema.  Skin:    General: Skin is warm and dry.     Findings: No erythema.  Neurological:     Mental Status: She is alert.  Psychiatric:        Behavior: Behavior normal.     ED Results / Procedures / Treatments   Labs (all labs ordered are listed, but only abnormal results are displayed) Labs Reviewed  BASIC METABOLIC PANEL - Abnormal; Notable for the following components:      Result Value   Glucose, Bld 102 (*)    All other components within normal limits  CBC - Abnormal; Notable for  the following components:   Hemoglobin 10.2 (*)    HCT 34.0 (*)    MCH 24.0 (*)    All other components within normal limits  D-DIMER, QUANTITATIVE (NOT AT [facility]) - Abnormal; Notable for the following components:   D-Dimer, Quant 0.52 (*)    All other components within normal limits  TSH  I-STAT BETA HCG  BLOOD, ED ([facility], [facility], [facility] ONLY)  TROPONIN I (HIGH SENSITIVITY)  TROPONIN I (HIGH SENSITIVITY)    EKG EKG Interpretation  Date/Time:  Wednesday September 13 2019 19:12:40 EDT Ventricular Rate:  116 PR Interval:  148 QRS Duration: 80 QT Interval:  316 QTC Calculation: 439 R Axis:   4 Text Interpretation: Sinus tachycardia with Premature supraventricular complexes Low voltage QRS Possible Inferior infarct , age undetermined Cannot rule out Anterior infarct , age undetermined Abnormal ECG When compared with ECG of EARLIER SAME DATE AV sequential or dual chamber electronic pacemaker Confirmed by Dione Booze ([id number]) on 09/13/2019 11:37:23 PM   Radiology DG Chest 2 View  Result Date: 09/13/2019 CLINICAL DATA:  Chest pain EXAM: CHEST - 2 VIEW COMPARISON:  09/25/2016 FINDINGS: The heart size and mediastinal contours are within normal limits. Both lungs are clear. The visualized skeletal structures are unremarkable. IMPRESSION: No active cardiopulmonary disease. Electronically Signed   By: Jasmine Pang M.D.   On: 09/13/2019 19:53    Procedures Procedures (including critical care time)  Medications Ordered in ED Medications  sodium chloride flush (NS) 0.9 % injection 3 mL (has no administration in time range)    ED Course  I have reviewed the triage vital signs and the nursing notes.  Pertinent labs & imaging results that were available during my care of the patient were reviewed by me and considered in my medical decision making (see chart for details).    MDM Rules/Calculators/A&P                      Patient presenting for evaluation of substernal chest pressure radiating to the  left side with associated palpitations, dyspnea on exertion.  She is afebrile, intermittently tachycardic in the ED.  Sent from urgent care for further evaluation due to abnormal EKG and abnormal vital signs.  EKG shows ST depressions involving the inferiolateral leads.  Serial troponins in the ED are negative however.  Chest x-ray shows no acute cardiopulmonary abnormalities.  Remainder of lab work reviewed by me shows no leukocytosis, mild anemia, no metabolic derangements, no renal insufficiency.  Her D-dimer is mildly elevated so we will obtain CTA to rule out PE.  7:00AM Signed out care to oncoming provider PA [facility].  Pending PE study.  If this is positive for PE, EKG changes could be secondary to right heart strain.  If PE study is negative for any acute concerning findings, cardiology should be consulted for the patient's abnormal EKG for further recommendations.  She would likely benefit from outpatient cardiology follow-up for reevaluation of her symptoms.  At this time have a low suspicion of pneumonia, pneumothorax, cardiac tamponade, esophageal rupture, or dissection.  Final Clinical Impression(s) / ED Diagnoses Final diagnoses:  Atypical chest pain  Abnormal EKG    Rx / DC Orders ED Discharge Orders    None       Bennye Alm 09/14/19 0350    Palumbo, April, MD 09/20/19 2324

## 2019-09-14 NOTE — ED Notes (Signed)
Patient Alert and oriented to baseline. Stable and ambulatory to baseline. Patient verbalized understanding of the discharge instructions.  Patient belongings were taken by the patient.   

## 2019-09-14 NOTE — Discharge Instructions (Signed)
Follow-up with your primary care provider and the cardiologist listed below. Return to the ER if you start to experience worsening chest pain or pressure, shortness of breath, leg swelling.

## 2019-09-14 NOTE — ED Provider Notes (Signed)
Physical Exam  BP 137/81   Pulse 94   Temp 98.4 F (36.9 C) (Oral)   Resp 18   SpO2 100%   Physical Exam Vitals and nursing note reviewed.  Constitutional:      General: She is not in acute distress.    Appearance: She is well-developed. She is not diaphoretic.     Comments: Ambulatory without difficulty.  HENT:     Head: Normocephalic and atraumatic.  Eyes:     General: No scleral icterus.    Conjunctiva/sclera: Conjunctivae normal.  Pulmonary:     Effort: Pulmonary effort is normal. No respiratory distress.  Musculoskeletal:     Cervical back: Normal range of motion.  Skin:    Findings: No rash.  Neurological:     Mental Status: She is alert.     ED Course/Procedures   Clinical Course as of Sep 13 824  Thu Sep 14, 2019  0813 Spoke to Dr. Flora Lipps, cardiologist who reviewed patient's EKG as well as today's workup. Feels that patient is safe for discharge home with outpatient follow-up.   [HK]  0813 TSH: 2.621 [HK]    Clinical Course User Index [HK] Dietrich Pates, PA-C    Procedures  MDM   Care of patient assumed from PA [facility] at 7:00 AM.  Agree with history, physical exam and plan.  See their note for further details.  Briefly, 49 y.o. female with PMH/PSH as below who presents with palpitations for the past 2 weeks.  Reports associated substernal chest pain radiating to the left arm and left shoulder.  Palpitations have been intermittent.  Does report dyspnea on exertion.  Patient went to urgent care, sent to the ED due to abnormal EKG.  EKG here shows ST depressions in the inferior lateral leads.  Both troponins have been negative.  Chest x-ray without any acute findings.  D-dimer is minimally elevated.  She has no history of CAD but does endorse family history of such.  Past Medical History:  Diagnosis Date  . Anxiety   . Hypertension   . Low back pain    Past Surgical History:  Procedure Laterality Date  . TUBAL LIGATION        Current Plan: Obtain CT  angio of the chest.  If positive for PE, EKG changes could be caused by this.  If negative, consider consulting cardiology regarding abnormal EKG.   MDM/ED Course: 7:43 AM CT angios negative for PE.  Will consult cardiology regarding EKG changes.  8:25 AM Per cardiology recommendations, EKG without any concerns that would require admission or further work-up today.  Feel that she is comfortable with cardiology follow-up outpatient.  Patient updated on plan.  She is agreeable.  We will have her return for worsening symptoms.  Consults: Cardiology- [facility]   Significant labs/images: DG Chest 2 View  Result Date: 09/13/2019 CLINICAL DATA:  Chest pain EXAM: CHEST - 2 VIEW COMPARISON:  09/25/2016 FINDINGS: The heart size and mediastinal contours are within normal limits. Both lungs are clear. The visualized skeletal structures are unremarkable. IMPRESSION: No active cardiopulmonary disease. Electronically Signed   By: Jasmine Pang M.D.   On: 09/13/2019 19:53   CT Angio Chest PE W and/or Wo Contrast  Result Date: 09/14/2019 CLINICAL DATA:  High probability for pulmonary embolism. Intermittent palpitation for 2 weeks with left arm numbness. EXAM: CT ANGIOGRAPHY CHEST WITH CONTRAST TECHNIQUE: Multidetector CT imaging of the chest was performed using the standard protocol during bolus administration of intravenous contrast. Multiplanar CT image  reconstructions and MIPs were obtained to evaluate the vascular anatomy. CONTRAST:  70mL OMNIPAQUE IOHEXOL 350 MG/ML SOLN COMPARISON:  None. FINDINGS: Cardiovascular: Satisfactory opacification of the pulmonary arteries to the segmental level. No evidence of pulmonary embolism. Normal heart size. No pericardial effusion. Mediastinum/Nodes: No adenopathy or mass. Lungs/Pleura: There is no edema, consolidation, effusion, or pneumothorax. Low volume chest which accounts for mosaic attenuation from areas of mild atelectasis or air trapping. No superimposed airway  thickening or central airway obstruction. Upper Abdomen: No acute finding Musculoskeletal: No acute or aggressive finding Review of the MIP images confirms the above findings. IMPRESSION: Negative for pulmonary embolism or other acute finding. Electronically Signed   By: Monte Fantasia M.D.   On: 09/14/2019 07:40    Patient is hemodynamically stable, in NAD, and able to ambulate in the ED. Evaluation does not show pathology that would require ongoing emergent intervention or inpatient treatment. I have personally reviewed and interpreted all lab work and imaging at today's ED visit. I explained the diagnosis to the patient. Pain has been managed and has no complaints prior to discharge. Patient is comfortable with above plan and is stable for discharge at this time. All questions were answered prior to disposition. Strict return precautions for returning to the ED were discussed. Encouraged follow up with PCP.   An After Visit Summary was printed and given to the patient.   Portions of this note were generated with Lobbyist. Dictation errors may occur despite best attempts at proofreading.       Delia Heady, PA-C 09/14/19 [id number]    Virgel Manifold, MD 09/16/19 [phone]

## 2019-09-14 NOTE — Progress Notes (Signed)
Cardiology Office Note:    Date:  09/14/2019   ID:  ASTER Bell, DOB 06/05/1970, MRN [id number]  PCP:  Myles Lipps, MD  Cardiologist:  Norman Herrlich, MD   Referring MD: Cy Blamer, MD  ASSESSMENT:    1. APC (atrial premature contractions)   2. Essential hypertension, benign   3. Hypokalemia    PLAN:    In order of problems listed above:  1. This is a difficult visit there are multiple potential etiologies she appears to have frequent APCs at least from an EKG done when she presented to urgent care and she has symptoms including anxiety about it.  We discussed further evaluation and treatment.  Her potassium is borderline low obviously can be a perception on from potassium supplements in 2 weeks check a potassium and magnesium.  I offered her beta-blocker with her borderline blood pressure she stopped her antihypertensive therapy and put her on Sectral 200 mg daily I advised her to purchase the smart phone adapter so she could frequently occur episodes to be sure we understand whether this is predominantly frequent APCs or more sustained arrhythmia.  And I will see her back in the office in 3 to 4 weeks to review her response  Next appointment 3 to 4 weeks   Medication Adjustments/Labs and Tests Ordered: Current medicines are reviewed at length with the patient today.  Concerns regarding medicines are outlined above.  Orders Placed This Encounter  Procedures  . Basic Metabolic Panel (BMET)  . Magnesium   Meds ordered this encounter  Medications  . acebutolol (SECTRAL) 200 MG capsule    Sig: Take 1 capsule (200 mg total) by mouth daily.    Dispense:  30 capsule    Refill:  3  . potassium chloride SA (KLOR-CON) 20 MEQ tablet    Sig: Take 1 tablet (20 mEq total) by mouth daily.    Dispense:  30 tablet    Refill:  3     Chief Complaint  Patient presents with  . Palpitations    History of Present Illness:    Brittney Bell is a 49 y.o. female who is being  seen today for the evaluation of palpitation at the request of Palumbo, April, MD. He was seen by Dr. Donnie Aho in April 2016 for rapid heart rhythms wore a monitor and was unable to capture arrhythmia. Burgess Estelle was seen in urgent care and subsequently [facility] ED for complaints of rapid heart rhythm and chest pain high-sensitivity troponins were normal EKG showed sinus tachycardia low-voltage RSR prime V1 V2 nonspecific ST depression unchanged from baseline EKG April 2016.  She had a total of 4 EKGs performed 1 of which showed frequent atrial premature contractions D-dimer was mildly elevated and she underwent CT angio of the chest is normal with no vascular coronary artery atherosclerosis or calcification.  She is mildly anemic with a hemoglobin of 10.2 normocytic shows borderline low 3.5 renal function normal sh was normal.  She also had a pregnancy test negative and the 19.  She has long history of intermittent palpitation and it causes her to be apprehensive.  She was driving her car yesterday she felt a little lightheaded intermittently short of breath was seen at urgent care and she had documented APCs subsequently seen in the emergency room.  She did not have syncope the episodes of brief but now occur several times a week or daily and she questions what is the problem and why is it occurring.  She takes no over-the-counter medications.  On her own she stopped antihypertensive therapy after weight loss.  She has various assorted aches but no anginal discomfort otherwise not short of breath no edema or syncope.  She has no known history of congenital rheumatic heart disease.  She does meet echocardiogram was done when she saw Dr. Donnie Aho several years ago.  Past Medical History:  Diagnosis Date  . Anxiety   . Hypertension   . Low back pain     Past Surgical History:  Procedure Laterality Date  . TUBAL LIGATION      Current Medications: No outpatient medications have been marked as  taking for the 09/14/19 encounter (Office Visit) with Baldo Daub, MD.     Allergies:   Azithromycin, Tizanidine, and Levaquin [levofloxacin]   Social History   Socioeconomic History  . Marital status: Divorced    Spouse name: Not on file  . Number of children: Not on file  . Years of education: Not on file  . Highest education level: Not on file  Occupational History  . Not on file  Tobacco Use  . Smoking status: Former Smoker    Quit date: 11/30/2015    Years since quitting: 3.7  . Smokeless tobacco: Never Used  Substance and Sexual Activity  . Alcohol use: Yes    Alcohol/week: 0.0 standard drinks  . Drug use: No  . Sexual activity: Yes  Other Topics Concern  . Not on file  Social History Narrative  . Not on file   Social Determinants of Health   Financial Resource Strain:   . Difficulty of Paying Living Expenses:   Food Insecurity:   . Worried About Programme researcher, broadcasting/film/video in the Last Year:   . Barista in the Last Year:   Transportation Needs:   . Freight forwarder (Medical):   Marland Kitchen Lack of Transportation (Non-Medical):   Physical Activity:   . Days of Exercise per Week:   . Minutes of Exercise per Session:   Stress:   . Feeling of Stress :   Social Connections:   . Frequency of Communication with Friends and Family:   . Frequency of Social Gatherings with Friends and Family:   . Attends Religious Services:   . Active Member of Clubs or Organizations:   . Attends Banker Meetings:   Marland Kitchen Marital Status:      Family History: The patient's family history includes Heart disease in her mother; Hypertension in her mother.  ROS:   Review of Systems  Constitution: Negative.  HENT: Negative.   Eyes: Negative.   Cardiovascular: Positive for palpitations.  Respiratory: Positive for shortness of breath.   Endocrine: Negative.   Hematologic/Lymphatic: Negative.   Skin: Negative.   Musculoskeletal: Positive for myalgias.  Gastrointestinal:  Negative.   Genitourinary: Negative.   Neurological: Negative.   Psychiatric/Behavioral: The patient is nervous/anxious.   Allergic/Immunologic: Negative.    Please see the history of present illness.     All other systems reviewed and are negative.  EKGs/Labs/Other Studies Reviewed:    The following studies were reviewed today:   EKG: I reviewed a total of 4 EKGs done in urgent care ED previous EKG in 2016 Dr. Karleen Hampshire Tilley's office note 2017 in the emergency room records during the visit  Recent Labs: 09/13/2019: BUN 7; Creatinine, Ser 0.72; Hemoglobin 10.2; Platelets 398; Potassium 3.5; Sodium 137 09/14/2019: TSH 2.621  Recent Lipid Panel No results found for: CHOL, TRIG, HDL, CHOLHDL,  VLDL, LDLCALC, LDLDIRECT  Physical Exam:    VS:  BP 140/88   Pulse 92     Wt Readings from Last 3 Encounters:  05/01/19 215 lb (97.5 kg)  03/08/18 206 lb (93.4 kg)  12/07/17 224 lb 12.8 oz (102 kg)     GEN:  Well nourished, well developed in no acute distress HEENT: Normal NECK: No JVD; No carotid bruits LYMPHATICS: No lymphadenopathy CARDIAC: RRR, no murmurs, rubs, gallops RESPIRATORY:  Clear to auscultation without rales, wheezing or rhonchi  ABDOMEN: Soft, non-tender, non-distended MUSCULOSKELETAL:  No edema; No deformity  SKIN: Warm and dry NEUROLOGIC:  Alert and oriented x 3 PSYCHIATRIC:  Normal affect     Signed, Shirlee More, MD  09/14/2019 11:59 AM    [place]

## 2019-09-22 ENCOUNTER — Telehealth: Payer: Self-pay | Admitting: Cardiology

## 2019-09-22 NOTE — Telephone Encounter (Signed)
Spoke with the patient and she states that she is concerned that her blood pressure medication Acebutolol 200 mg daily may be too strong. Her HR has been running between 50-54 bpm and BP is 99/50. She states that she also feels a little dizzy when she is walking around.   I spoke with Dr. Tomie China in regards to this and he states that the patient should stop taking the acebutolol at this time and needs to increase her fluid intake tonight. I also advised her to be careful when getting up to make sure that she does not fall due to dizziness. She was also instructed that if her symptoms get worse to proceed to the hospital.  I printed the EKG reading off that the patient sent through MyChart and Dr. Josiah Lobo reviewed and said he was not concerned with it.    Patient verbalizes understanding and does not voice any other issues or concerns.

## 2019-09-22 NOTE — Telephone Encounter (Signed)
New Message   Pt is calling and would like for the nurse to call her  She says she sent in a EKG for Dr Dulce Sellar to review and she says she does have more if he wants her to send them in  She is concerned and says she is feeling tired     Please call

## 2019-09-24 ENCOUNTER — Emergency Department (HOSPITAL_BASED_OUTPATIENT_CLINIC_OR_DEPARTMENT_OTHER)
Admission: EM | Admit: 2019-09-24 | Discharge: 2019-09-24 | Disposition: A | Discharge status: Home / Self Care | Payer: BC Managed Care – PPO | Attending: Emergency Medicine | Admitting: Emergency Medicine

## 2019-09-24 ENCOUNTER — Emergency Department (HOSPITAL_BASED_OUTPATIENT_CLINIC_OR_DEPARTMENT_OTHER): Payer: BC Managed Care – PPO

## 2019-09-24 ENCOUNTER — Other Ambulatory Visit: Payer: Self-pay

## 2019-09-24 DIAGNOSIS — R0789 Other chest pain: Secondary | ICD-10-CM

## 2019-09-24 DIAGNOSIS — R002 Palpitations: Secondary | ICD-10-CM

## 2019-09-24 DIAGNOSIS — Z87891 Personal history of nicotine dependence: Secondary | ICD-10-CM | POA: Insufficient documentation

## 2019-09-24 DIAGNOSIS — I10 Essential (primary) hypertension: Secondary | ICD-10-CM | POA: Insufficient documentation

## 2019-09-24 DIAGNOSIS — R079 Chest pain, unspecified: Secondary | ICD-10-CM | POA: Diagnosis not present

## 2019-09-24 LAB — BASIC METABOLIC PANEL
Anion gap: 9 (ref 5–15)
BUN: 11 mg/dL (ref 6–20)
CO2: 23 mmol/L (ref 22–32)
Calcium: 9 mg/dL (ref 8.9–10.3)
Chloride: 105 mmol/L (ref 98–111)
Creatinine, Ser: 0.84 mg/dL (ref 0.44–1.00)
GFR calc Af Amer: >60 mL/min (ref >60)
GFR calc non Af Amer: >60 mL/min (ref >60)
Glucose, Bld: 82 mg/dL (ref 70–99)
Potassium: 3.6 mmol/L (ref 3.5–5.1)
Sodium: 137 mmol/L (ref 135–145)

## 2019-09-24 LAB — PREGNANCY, URINE: Preg Test, Ur: NEGATIVE

## 2019-09-24 LAB — CBC
HCT: 35 % — ABNORMAL LOW (ref 36.0–46.0)
Hemoglobin: 11 g/dL — ABNORMAL LOW (ref 12.0–15.0)
MCH: 24.8 pg — ABNORMAL LOW (ref 26.0–34.0)
MCHC: 31.4 g/dL (ref 30.0–36.0)
MCV: 78.8 fL — ABNORMAL LOW (ref 80.0–100.0)
Platelets: 376 10*3/uL (ref 150–400)
RBC: 4.44 MIL/uL (ref 3.87–5.11)
RDW: 15.5 % (ref 11.5–15.5)
WBC: 7.4 10*3/uL (ref 4.0–10.5)
nRBC: 0 % (ref 0.0–0.2)

## 2019-09-24 LAB — TROPONIN I (HIGH SENSITIVITY)
Troponin I (High Sensitivity): 3 ng/L (ref <18)
Troponin I (High Sensitivity): 3 ng/L (ref <18)

## 2019-09-24 NOTE — ED Triage Notes (Addendum)
Pt presents with upper left chest pain since yesterday. She had a cardiac workup on 4/14 at [facility] and was started on a beta blocker. Reports she has been more tired this week and her BP was low so her doctor told her to stop taking it. She reports intermittent chest pain radiating to her L shoulder and states she has felt light headed today. Pt states she has also had intermittent numbness to arms and legs. She is here at the encouragement of her family and does not want to "get poked again"

## 2019-09-24 NOTE — ED Notes (Signed)
Removed 20G IV from right antecubital. Catheter intact, dressing applied.

## 2019-09-24 NOTE — ED Provider Notes (Signed)
[facility] EMERGENCY DEPARTMENT Provider Note   CSN: [id number] Arrival date & time: 09/24/19  1630     History Chief Complaint  Patient presents with  . Chest Pain    Brittney Bell is a 49 y.o. female.  Patient is a she states she has had chest pain for the last several weeks.  Is associated with the sensation that her heart is racing.  It seems to be worse when she is anxious or if she is exerting herself.  She only seems to have chest pain when she is having the feeling that her heart is racing.  She also has been woken up a couple times with it.  She was seen in the ED recently for similar symptoms.  Her troponins were normal.  She had CT angio of her chest which was negative for PE.  She is followed up with cardiology who initially started her on a beta-blocker but she was not tolerating this.  She had extreme fatigue and low blood pressures so she recently stopped it.  She is using an outpatient app to record EKGs when she is having the symptoms.  She has had some episodes of what appeared to be sinus tachycardia.  There was one episode of atrial fibrillation but it had a lot of artifact and was hard to interpret.        Past Medical History:  Diagnosis Date  . Anxiety   . Hypertension   . Low back pain     Patient Active Problem List   Diagnosis Date Noted  . Generalized anxiety disorder 05/20/2017  . Essential hypertension, benign 05/15/2017    Past Surgical History:  Procedure Laterality Date  . TUBAL LIGATION       OB History   No obstetric history on file.     Family History  Problem Relation Age of Onset  . Heart disease Mother   . Hypertension Mother     Social History   Tobacco Use  . Smoking status: Former Smoker    Quit date: 11/30/2015    Years since quitting: 3.8  . Smokeless tobacco: Never Used  Substance Use Topics  . Alcohol use: Yes    Alcohol/week: 0.0 standard drinks  . Drug use: No    Home Medications Prior to  Admission medications   Medication Sig Start Date End Date Taking? Authorizing Provider  acebutolol (SECTRAL) 200 MG capsule Take 1 capsule (200 mg total) by mouth daily. 09/14/19   Baldo Daub, MD  acetaminophen (TYLENOL) 500 MG tablet Take 1,000-1,500 mg by mouth every 8 (eight) hours as needed for mild pain or headache.    [provider]  cyclobenzaprine (FLEXERIL) 10 MG tablet Take 0.5-1 tablets (5-10 mg total) by mouth 3 (three) times daily as needed for muscle spasms. Patient not taking: Reported on 09/14/2019 03/08/18   Myles Lipps, MD  FLUoxetine (PROZAC) 40 MG capsule Take 1 capsule (40 mg total) by mouth daily. Patient not taking: Reported on 09/14/2019 03/08/18   Myles Lipps, MD  ibuprofen (ADVIL) 200 MG tablet Take 600-800 mg by mouth every 6 (six) hours as needed for headache or moderate pain.    [provider]  meloxicam (MOBIC) 15 MG tablet Take 1 tablet (15 mg total) by mouth daily. Patient not taking: Reported on 09/14/2019 03/08/18   Myles Lipps, MD  metoprolol succinate (TOPROL-XL) 25 MG 24 hr tablet Take 0.5 tablets (12.5 mg total) by mouth daily. Patient not taking:  Reported on 09/14/2019 03/08/18   Rutherford Guys, MD  penicillin v potassium (VEETID) 500 MG tablet Take one tab by mouth twice daily for 10 days Patient not taking: Reported on 09/14/2019 05/01/19   Kandra Nicolas, MD  potassium chloride SA (KLOR-CON) 20 MEQ tablet Take 1 tablet (20 mEq total) by mouth daily. 09/14/19   Richardo Priest, MD    Allergies    Azithromycin, Tizanidine, and Levaquin [levofloxacin]  Review of Systems   Review of Systems  Constitutional: Negative for chills, diaphoresis, fatigue and fever.  HENT: Negative for congestion, rhinorrhea and sneezing.   Eyes: Negative.   Respiratory: Negative for cough, chest tightness and shortness of breath.   Cardiovascular: Positive for chest pain and palpitations. Negative for leg swelling.  Gastrointestinal:  Negative for abdominal pain, blood in stool, diarrhea, nausea and vomiting.  Genitourinary: Negative for difficulty urinating, flank pain, frequency and hematuria.  Musculoskeletal: Negative for arthralgias and back pain.  Skin: Negative for rash.  Neurological: Negative for dizziness, speech difficulty, weakness, numbness and headaches.    Physical Exam Updated Vital Signs BP 107/68   Pulse 72   Temp 98.7 F (37.1 C) (Oral)   Resp 18   SpO2 100%   Physical Exam Constitutional:      Appearance: She is well-developed.  HENT:     Head: Normocephalic and atraumatic.  Eyes:     Pupils: Pupils are equal, round, and reactive to light.  Cardiovascular:     Rate and Rhythm: Normal rate and regular rhythm.     Heart sounds: Normal heart sounds.  Pulmonary:     Effort: Pulmonary effort is normal. No respiratory distress.     Breath sounds: Normal breath sounds. No wheezing or rales.  Chest:     Chest wall: Tenderness (Some tenderness on palpation of the left chest wall and left shoulder) present.  Abdominal:     General: Bowel sounds are normal.     Palpations: Abdomen is soft.     Tenderness: There is no abdominal tenderness. There is no guarding or rebound.  Musculoskeletal:        General: Normal range of motion.     Cervical back: Normal range of motion and neck supple.     Comments: No edema or calf tenderness  Lymphadenopathy:     Cervical: No cervical adenopathy.  Skin:    General: Skin is warm and dry.     Findings: No rash.  Neurological:     Mental Status: She is alert and oriented to person, place, and time.     ED Results / Procedures / Treatments   Labs (all labs ordered are listed, but only abnormal results are displayed) Labs Reviewed  CBC - Abnormal; Notable for the following components:      Result Value   Hemoglobin 11.0 (*)    HCT 35.0 (*)    MCV 78.8 (*)    MCH 24.8 (*)    All other components within normal limits  BASIC METABOLIC PANEL    PREGNANCY, URINE  TROPONIN I (HIGH SENSITIVITY)  TROPONIN I (HIGH SENSITIVITY)    EKG None  Radiology DG Chest 2 View  Result Date: 09/24/2019 CLINICAL DATA:  49 year old female with acute chest pain. EXAM: CHEST - 2 VIEW COMPARISON:  09/13/2019 and prior radiographs FINDINGS: The cardiomediastinal silhouette is unremarkable. Mild chronic peribronchial thickening again noted. There is no evidence of focal airspace disease, pulmonary edema, suspicious pulmonary nodule/mass, pleural effusion, or pneumothorax. No acute bony  abnormalities are identified. IMPRESSION: No active cardiopulmonary disease. Electronically Signed   By: Harmon Pier M.D.   On: 09/24/2019 17:23    Procedures Procedures (including critical care time)  Medications Ordered in ED Medications - No data to display  ED Course  I have reviewed the triage vital signs and the nursing notes.  Pertinent labs & imaging results that were available during my care of the patient were reviewed by me and considered in my medical decision making (see chart for details).    MDM Rules/Calculators/A&P                      Patient is a 49 year old female who presents with palpitations.  She has some associated chest pain.  The chest pain seems to be only related to the palpitations.  She is in sinus rhythm now without ectopy.  No ischemic changes on EKG.  She has had 2 - troponins.  Her other labs are nonconcerning.  She was discharged home in good condition.  She has had a recent negative PE study.  She was encouraged to follow-up with her cardiologist.  Return precautions were given. Final Clinical Impression(s) / ED Diagnoses Final diagnoses:  Atypical chest pain  Palpitations    Rx / DC Orders ED Discharge Orders    None       Rolan Bucco, MD 09/24/19 2129

## 2019-09-25 ENCOUNTER — Telehealth: Payer: Self-pay

## 2019-09-25 ENCOUNTER — Telehealth: Payer: Self-pay | Admitting: Cardiology

## 2019-09-25 DIAGNOSIS — R079 Chest pain, unspecified: Secondary | ICD-10-CM

## 2019-09-25 NOTE — Telephone Encounter (Signed)
New Message:    Pt was [facility] yesterday for chestt pains. They told her to contact Dr [facility] office today. Pt  Said she was told on Friday by the nurse to stop her medicine.

## 2019-09-25 NOTE — Telephone Encounter (Signed)
Spoke to the patient and let her know that Dr. Dulce Sellar reviewed her messages and has requested that she has an echocardiogram and a lexiscan done at the [address] location. I put in the orders for these tests and let her know that she should get a call to schedule these tests. No other issues or concerns noted. I am routing this message to the [address] scheduling to get her scheduled for these two tests.   The instructions for the lexiscan were reviewed over the phone with the patient and were also sent through her via MyChart where she received them.   Encouraged patient to call back with any questions or concerns.

## 2019-09-26 ENCOUNTER — Telehealth: Payer: Self-pay

## 2019-09-26 NOTE — Telephone Encounter (Signed)
Spoke to the patient and let her know that Dr. Dulce Sellar recommends that she comes into the office tomorrow to see Dr. Servando Salina. I set her up an appointment to see Dr. Servando Salina tomorrow 09/27/19 at 8:05AM.

## 2019-09-27 ENCOUNTER — Other Ambulatory Visit: Payer: Self-pay

## 2019-09-27 ENCOUNTER — Encounter: Payer: Self-pay | Admitting: Cardiology

## 2019-09-27 ENCOUNTER — Encounter: Payer: Self-pay | Admitting: Emergency Medicine

## 2019-09-27 ENCOUNTER — Ambulatory Visit: Payer: BLUE CROSS/BLUE SHIELD | Admitting: Cardiology

## 2019-09-27 ENCOUNTER — Ambulatory Visit (HOSPITAL_BASED_OUTPATIENT_CLINIC_OR_DEPARTMENT_OTHER)
Admission: RE | Admit: 2019-09-27 | Discharge: 2019-09-27 | Disposition: A | Discharge status: Home / Self Care | Payer: BC Managed Care – PPO | Source: Ambulatory Visit | Attending: Cardiology | Admitting: Cardiology

## 2019-09-27 VITALS — BP 140/86 | HR 80 | Ht 67.0 in | Wt 195.0 lb

## 2019-09-27 DIAGNOSIS — R079 Chest pain, unspecified: Secondary | ICD-10-CM

## 2019-09-27 DIAGNOSIS — E669 Obesity, unspecified: Secondary | ICD-10-CM

## 2019-09-27 DIAGNOSIS — I10 Essential (primary) hypertension: Secondary | ICD-10-CM | POA: Diagnosis not present

## 2019-09-27 DIAGNOSIS — I491 Atrial premature depolarization: Secondary | ICD-10-CM | POA: Diagnosis not present

## 2019-09-27 LAB — ECHOCARDIOGRAM COMPLETE
Height: 67 in
Weight: 3120 oz

## 2019-09-27 MED ORDER — DILTIAZEM HCL ER COATED BEADS 120 MG PO CP24
120.0000 mg | ORAL_CAPSULE | Freq: Every day | ORAL | 1 refills | Status: DC
Start: 2019-09-27 — End: 2021-02-07

## 2019-09-27 NOTE — Progress Notes (Signed)
  Echocardiogram 2D Echocardiogram has been performed.  Sinda Du 09/27/2019, 2:55 PM

## 2019-09-27 NOTE — Progress Notes (Signed)
Cardiology Office Note:    Date:  09/27/2019   ID:  Brittney Bell, DOB [birth year], MRN [id number]  PCP:  Myles Lipps, MD  Cardiologist:  No primary care provider on file.  Electrophysiologist:  None   Referring MD: Myles Lipps, MD   Follow up visit   History of Present Illness:    Brittney Bell is a 49 y.o. female with a hx of frequent PACs, hypertension.  The patient presents for follow-up visit.  Patient last saw Dr. Dulce Sellar on September 14, 2019 at that time he started her on acebutolol due to her frequent PACs.  The patient reported that she started take this medicine and made her significantly tired and was unable to perform her work duties.  She says that she will sit at her desk and continue to sleep.  In addition an echocardiogram as well as a myocardial perfusion scan was ordered.  Testing are still pending.  As a result today she stopped the medications.  She ended up in the emergency department on September 24, 2019 given increasing heart rate and chest pain.  The palpitations she tells me she experiences this almost every day and has been since she left the ED.  She notes that the chest pain is associated with the palpitations.  She described as a left-sided tenderness is usually seen her upper left chest wall and shoulders.  She denies any associated shortness of breath.  Past Medical History:  Diagnosis Date  . Anxiety   . Hypertension   . Low back pain     Past Surgical History:  Procedure Laterality Date  . TUBAL LIGATION      Current Medications: Current Meds  Medication Sig  . acetaminophen (TYLENOL) 500 MG tablet Take 1,000-1,500 mg by mouth every 8 (eight) hours as needed for mild pain or headache.  . ibuprofen (ADVIL) 200 MG tablet Take 600-800 mg by mouth every 6 (six) hours as needed for headache or moderate pain.     Allergies:   Azithromycin, Tizanidine, and Levaquin [levofloxacin]   Social History   Socioeconomic History  . Marital  status: Divorced    Spouse name: Not on file  . Number of children: Not on file  . Years of education: Not on file  . Highest education level: Not on file  Occupational History  . Not on file  Tobacco Use  . Smoking status: Former Smoker    Quit date: 11/30/2015    Years since quitting: 3.8  . Smokeless tobacco: Never Used  Substance and Sexual Activity  . Alcohol use: Yes    Alcohol/week: 0.0 standard drinks  . Drug use: No  . Sexual activity: Yes  Other Topics Concern  . Not on file  Social History Narrative  . Not on file   Social Determinants of Health   Financial Resource Strain:   . Difficulty of Paying Living Expenses:   Food Insecurity:   . Worried About Programme researcher, broadcasting/film/video in the Last Year:   . Barista in the Last Year:   Transportation Needs:   . Freight forwarder (Medical):   Marland Kitchen Lack of Transportation (Non-Medical):   Physical Activity:   . Days of Exercise per Week:   . Minutes of Exercise per Session:   Stress:   . Feeling of Stress :   Social Connections:   . Frequency of Communication with Friends and Family:   . Frequency of Social Gatherings with Friends and Family:   .  Attends Religious Services:   . Active Member of Clubs or Organizations:   . Attends Archivist Meetings:   Marland Kitchen Marital Status:      Family History: The patient's family history includes Heart disease in her mother; Hypertension in her mother.  ROS:   Review of Systems  Constitution: Negative for decreased appetite, fever and weight gain.  HENT: Negative for congestion, ear discharge, hoarse voice and sore throat.   Eyes: Negative for discharge, redness, vision loss in right eye and visual halos.  Cardiovascular: Reports chest pain and palpitations.  Negative for dyspnea on exertion, leg swelling, orthopnea.  Respiratory: Negative for cough, hemoptysis, shortness of breath and snoring.   Endocrine: Negative for heat intolerance and polyphagia.    Hematologic/Lymphatic: Negative for bleeding problem. Does not bruise/bleed easily.  Skin: Negative for flushing, nail changes, rash and suspicious lesions.  Musculoskeletal: Negative for arthritis, joint pain, muscle cramps, myalgias, neck pain and stiffness.  Gastrointestinal: Negative for abdominal pain, bowel incontinence, diarrhea and excessive appetite.  Genitourinary: Negative for decreased libido, genital sores and incomplete emptying.  Neurological: Negative for brief paralysis, focal weakness, headaches and loss of balance.  Psychiatric/Behavioral: Negative for altered mental status, depression and suicidal ideas.  Allergic/Immunologic: Negative for HIV exposure and persistent infections.    EKGs/Labs/Other Studies Reviewed:    The following studies were reviewed today:   EKG:  The ekg ordered today demonstrates sinus rhythm, heart rate 65 bpm, poor R wave progression which could be suggestive of old anterior infarction.  Compared to EKG done on 09/25/2019 the patient remains in sinus rhythm heart rate at that time was 99 bpm with nonspecific ST changes.  Recent Labs: 09/14/2019: TSH 2.621 09/24/2019: BUN 11; Creatinine, Ser 0.84; Hemoglobin 11.0; Platelets 376; Potassium 3.6; Sodium 137  Recent Lipid Panel No results found for: CHOL, TRIG, HDL, CHOLHDL, VLDL, LDLCALC, LDLDIRECT  Physical Exam:    VS:  BP 140/86   Pulse 80   Ht 5\' 7"  (1.702 m)   Wt 195 lb (88.5 kg)   SpO2 99%   BMI 30.54 kg/m     Wt Readings from Last 3 Encounters:  09/27/19 195 lb (88.5 kg)  05/01/19 215 lb (97.5 kg)  03/08/18 206 lb (93.4 kg)     GEN: Well nourished, well developed in no acute distress HEENT: Normal NECK: No JVD; No carotid bruits LYMPHATICS: No lymphadenopathy CARDIAC: S1S2 noted,RRR, no murmurs, rubs, gallops RESPIRATORY:  Clear to auscultation without rales, wheezing or rhonchi  ABDOMEN: Soft, non-tender, non-distended, +bowel sounds, no guarding. EXTREMITIES: No edema, No  cyanosis, no clubbing MUSCULOSKELETAL:  No deformity  SKIN: Warm and dry NEUROLOGIC:  Alert and oriented x 3, non-focal PSYCHIATRIC:  Normal affect, good insight  ASSESSMENT:    1. PAC (premature atrial contraction)   2. Hypertension, unspecified type   3. Obesity (BMI 30-39.9)    PLAN:      Frequent PACs-the patient to have her mobile cardiac with her today I was able to review this.  Unfortunately there is significant baseline artifact, there certainly are some irregular heart intervals which can present with frequent PACs is hard to discern atrial fibrillation at this time.  There was few beats that are highly suspicious for A. fib, faster to hopefully try to be more still when she is doing her mobile cardiac and this may help with her underlying baseline artifact.  In terms of her worsening symptomatic PACs/palpitations she absolutely would prefer not to take any beta-blocker as she says  that metoprolol made her sick and acebutolol made her significantly tired and nonfunctional at work.  I am going to try calcium channel blocker.  She is agreeable to take diltiazem 120 mg extended release daily.  Her echocardiogram is still pending saw hopefully once we are able to get this testing we can use other antiarrhythmics.  Hypertension-blood pressure is 140/86 mmHg.  Hopefully Cardizem also help bring this down to target less than 130/80  Obesity-continue to exercise.   The patient is in agreement with the above plan. The patient left the office in stable condition.  The patient will follow up normally as scheduled.   Medication Adjustments/Labs and Tests Ordered: Current medicines are reviewed at length with the patient today.  Concerns regarding medicines are outlined above.  Orders Placed This Encounter  Procedures  . EKG 12-Lead   Meds ordered this encounter  Medications  . diltiazem (CARDIZEM CD) 120 MG 24 hr capsule    Sig: Take 1 capsule (120 mg total) by mouth daily.     Dispense:  90 capsule    Refill:  1    Patient Instructions  Medication Instructions:  Your physician has recommended you make the following change in your medication:  START: Cardizem 120 mg daily   *If you need a refill on your cardiac medications before your next appointment, please call your pharmacy*   Lab Work: None.  If you have labs (blood work) drawn today and your tests are completely normal, you will receive your results only by: Marland Kitchen MyChart Message (if you have MyChart) OR . A paper copy in the mail If you have any lab test that is abnormal or we need to change your treatment, we will call you to review the results.   Testing/Procedures: None.    Follow-Up: At [facility], you and your health needs are our priority.  As part of our continuing mission to provide you with exceptional heart care, we have created designated Provider Care Teams.  These Care Teams include your primary Cardiologist (physician) and Advanced Practice Providers (APPs -  Physician Assistants and Nurse Practitioners) who all work together to provide you with the care you need, when you need it.  We recommend signing up for the patient portal called "MyChart".  Sign up information is provided on this After Visit Summary.  MyChart is used to connect with patients for Virtual Visits (Telemedicine).  Patients are able to view lab/test results, encounter notes, upcoming appointments, etc.  Non-urgent messages can be sent to your provider as well.   To learn more about what you can do with MyChart, go to [email or web address].    Your next appointment:    Follow up with Dr. Dulce Sellar as scheduled.      Adopting a Healthy Lifestyle.  Know what a healthy weight is for you (roughly BMI <25) and aim to maintain this   Aim for 7+ servings of fruits and vegetables daily   65-80+ fluid ounces of water or unsweet tea for healthy kidneys   Limit to max 1 drink of alcohol per day; avoid  smoking/tobacco   Limit animal fats in diet for cholesterol and heart health - choose grass fed whenever available   Avoid highly processed foods, and foods high in saturated/trans fats   Aim for low stress - take time to unwind and care for your mental health   Aim for 150 min of moderate intensity exercise weekly for heart health, and weights twice weekly for bone health  Aim for 7-9 hours of sleep daily   When it comes to diets, agreement about the perfect plan isnt easy to find, even among the experts. Experts at the [facility] [employer] developed an idea known as the Healthy Eating Plate. Just imagine a plate divided into logical, healthy portions.   The emphasis is on diet quality:   Load up on vegetables and fruits - one-half of your plate: Aim for color and variety, and remember that potatoes dont count.   Go for whole grains - one-quarter of your plate: Whole wheat, barley, wheat berries, quinoa, oats, brown rice, and foods made with them. If you want pasta, go with whole wheat pasta.   Protein power - one-quarter of your plate: Fish, chicken, beans, and nuts are all healthy, versatile protein sources. Limit red meat.   The diet, however, does go beyond the plate, offering a few other suggestions.   Use healthy plant oils, such as olive, canola, soy, corn, sunflower and peanut. Check the labels, and avoid partially hydrogenated oil, which have unhealthy trans fats.   If youre thirsty, drink water. Coffee and tea are good in moderation, but skip sugary drinks and limit milk and dairy products to one or two daily servings.   The type of carbohydrate in the diet is more important than the amount. Some sources of carbohydrates, such as vegetables, fruits, whole grains, and beans-are healthier than others.   Finally, stay active  Signed, Thomasene Ripple, DO  09/27/2019 9:56 AM    [facility]

## 2019-09-27 NOTE — Patient Instructions (Signed)
Medication Instructions:  Your physician has recommended you make the following change in your medication:  START: Cardizem 120 mg daily   *If you need a refill on your cardiac medications before your next appointment, please call your pharmacy*   Lab Work: None.  If you have labs (blood work) drawn today and your tests are completely normal, you will receive your results only by: Marland Kitchen MyChart Message (if you have MyChart) OR . A paper copy in the mail If you have any lab test that is abnormal or we need to change your treatment, we will call you to review the results.   Testing/Procedures: None.    Follow-Up: At [facility], you and your health needs are our priority.  As part of our continuing mission to provide you with exceptional heart care, we have created designated Provider Care Teams.  These Care Teams include your primary Cardiologist (physician) and Advanced Practice Providers (APPs -  Physician Assistants and Nurse Practitioners) who all work together to provide you with the care you need, when you need it.  We recommend signing up for the patient portal called "MyChart".  Sign up information is provided on this After Visit Summary.  MyChart is used to connect with patients for Virtual Visits (Telemedicine).  Patients are able to view lab/test results, encounter notes, upcoming appointments, etc.  Non-urgent messages can be sent to your provider as well.   To learn more about what you can do with MyChart, go to [email or web address].    Your next appointment:    Follow up with Dr. Dulce Sellar as scheduled.

## 2019-09-28 ENCOUNTER — Telehealth (HOSPITAL_COMMUNITY): Payer: Self-pay | Admitting: *Deleted

## 2019-09-28 DIAGNOSIS — I10 Essential (primary) hypertension: Secondary | ICD-10-CM | POA: Diagnosis not present

## 2019-09-28 DIAGNOSIS — I493 Ventricular premature depolarization: Secondary | ICD-10-CM | POA: Diagnosis not present

## 2019-09-28 NOTE — Telephone Encounter (Signed)
Patient given detailed instructions per Myocardial Perfusion Study Information Sheet for the test on 10/02/19 at 10:30. Patient notified to arrive 15 minutes early and that it is imperative to arrive on time for appointment to keep from having the test rescheduled.  If you need to cancel or reschedule your appointment, please call the office within 24 hours of your appointment. . Patient verbalized understanding.Leland Staszewski S    

## 2019-09-29 LAB — BASIC METABOLIC PANEL
BUN/Creatinine Ratio: 9 (ref 9–23)
BUN: 7 mg/dL (ref 6–24)
CO2: 20 mmol/L (ref 20–29)
Calcium: 9.4 mg/dL (ref 8.7–10.2)
Chloride: 106 mmol/L (ref 96–106)
Creatinine, Ser: 0.76 mg/dL (ref 0.57–1.00)
GFR calc Af Amer: 107 mL/min/{1.73_m2} (ref >59)
GFR calc non Af Amer: 92 mL/min/{1.73_m2} (ref >59)
Glucose: 86 mg/dL (ref 65–99)
Potassium: 4.1 mmol/L (ref 3.5–5.2)
Sodium: 141 mmol/L (ref 134–144)

## 2019-09-29 LAB — MAGNESIUM: Magnesium: 2 mg/dL (ref 1.6–2.3)

## 2019-10-02 ENCOUNTER — Telehealth: Payer: Self-pay

## 2019-10-02 ENCOUNTER — Other Ambulatory Visit (HOSPITAL_BASED_OUTPATIENT_CLINIC_OR_DEPARTMENT_OTHER): Payer: BLUE CROSS/BLUE SHIELD

## 2019-10-02 ENCOUNTER — Other Ambulatory Visit: Payer: Self-pay

## 2019-10-02 ENCOUNTER — Ambulatory Visit (HOSPITAL_COMMUNITY): Payer: BC Managed Care – PPO | Attending: Internal Medicine

## 2019-10-02 VITALS — Ht 67.0 in | Wt 195.0 lb

## 2019-10-02 DIAGNOSIS — R079 Chest pain, unspecified: Secondary | ICD-10-CM | POA: Insufficient documentation

## 2019-10-02 DIAGNOSIS — I491 Atrial premature depolarization: Secondary | ICD-10-CM | POA: Insufficient documentation

## 2019-10-02 LAB — MYOCARDIAL PERFUSION IMAGING
LV dias vol: 85 mL (ref 46–106)
LV sys vol: 22 mL
Peak HR: 100 {beats}/min
Rest HR: 60 {beats}/min
SDS: 1
SRS: 1
SSS: 2
TID: 1.06

## 2019-10-02 MED ORDER — TECHNETIUM TC 99M TETROFOSMIN IV KIT
10.9000 | PACK | Freq: Once | INTRAVENOUS | Status: AC | PRN
  Administered 2019-10-02: 10.9 via INTRAVENOUS
  Filled 2019-10-02: qty 11

## 2019-10-02 MED ORDER — REGADENOSON 0.4 MG/5ML IV SOLN
0.4000 mg | Freq: Once | INTRAVENOUS | Status: AC
  Administered 2019-10-02: 0.4 mg via INTRAVENOUS

## 2019-10-02 MED ORDER — TECHNETIUM TC 99M TETROFOSMIN IV KIT
31.9000 | PACK | Freq: Once | INTRAVENOUS | Status: AC | PRN
  Administered 2019-10-02: 31.9 via INTRAVENOUS
  Filled 2019-10-02: qty 32

## 2019-10-02 NOTE — Telephone Encounter (Signed)
Spoke with patient regarding results and recommendation.  Patient verbalizes understanding and is agreeable to plan of care. Advised patient to call back with any issues or concerns.  

## 2019-10-03 ENCOUNTER — Telehealth: Payer: Self-pay

## 2019-10-03 NOTE — Telephone Encounter (Signed)
-----   Message from Baldo Daub, MD sent at 10/03/2019  8:30 AM EDT ----- Normal or stable result  Stress test is normal

## 2019-10-03 NOTE — Telephone Encounter (Signed)
Spoke with patient regarding results.  Patient verbalizes understanding and is agreeable to plan of care. Advised patient to call back with any issues or concerns.  

## 2019-10-03 NOTE — Progress Notes (Signed)
Cardiology Office Note:    Date:  10/04/2019   ID:  [name], DOB [birth year], MRN [id number]  PCP:  Rutherford Guys, MD  Cardiologist:  Shirlee More, MD    Referring MD: Rutherford Guys, MD    ASSESSMENT:    1. PAC (premature atrial contraction)   2. Essential hypertension, benign    PLAN:    In order of problems listed above:  1. Symptomatically improved reassured continue diltiazem and keep on a potassium supplement if she had hypokalemia that was worsening atrial arrhythmia.   Next appointment: 1 year or sooner   Medication Adjustments/Labs and Tests Ordered: Current medicines are reviewed at length with the patient today.  Concerns regarding medicines are outlined above.  No orders of the defined types were placed in this encounter.  No orders of the defined types were placed in this encounter.   Chief Complaint  Patient presents with  . Palpitations    History of Present Illness:    Brittney Bell is a 49 y.o. female with a hx of hypertension and multiple cardiology interactions and ED visit with palpitation.  She has symptomatic APCs.  She was intolerant of a beta-blocker.  She was last seen 09/27/2019 and was placed on a rate limiting calcium channel blocker. Compliance with diet, lifestyle and medications: Yes  I reviewed her testing with her meds given her a great deal of reassurance.  She has some palpitation not severe sustained and we decided to remain on her diltiazem BP at target and continue potassium.  She prefers not to follow-up in the office but I asked her to come back and see me in 1 year.  In general this is a good visit I think her overall insurance and health is improved  Echocardiogram 09/27/2019: Left ventricular size and function and no significant valvular abnormality. Cardiac perfusion study performed 10/02/2019 view showed normal perfusion normal function ejection fraction 74% a low risk test.  She has purchased his smart phone  EKG monitor adapter and we have encouraged her to capture episodes at home. Past Medical History:  Diagnosis Date  . Anxiety   . Anxiety state 03/17/2012   Overview:  10/1 IMO update  . Cellulitis and abscess of face 01/29/2013  . Essential hypertension, benign 05/15/2017  . Generalized anxiety disorder 05/20/2017  . Hypertension   . Low back pain     Past Surgical History:  Procedure Laterality Date  . TUBAL LIGATION      Current Medications: Current Meds  Medication Sig  . acetaminophen (TYLENOL) 500 MG tablet Take 1,000-1,500 mg by mouth every 8 (eight) hours as needed for mild pain or headache.  . diltiazem (CARDIZEM CD) 120 MG 24 hr capsule Take 1 capsule (120 mg total) by mouth daily.  Marland Kitchen ibuprofen (ADVIL) 200 MG tablet Take 600-800 mg by mouth every 6 (six) hours as needed for headache or moderate pain.  Marland Kitchen POTASSIUM CHLORIDE ER PO Take 20 mEq by mouth daily.     Allergies:   Azithromycin, Tizanidine, and Levaquin [levofloxacin]   Social History   Socioeconomic History  . Marital status: Divorced    Spouse name: Not on file  . Number of children: Not on file  . Years of education: Not on file  . Highest education level: Not on file  Occupational History  . Not on file  Tobacco Use  . Smoking status: Former Smoker    Quit date: 11/30/2015    Years since quitting: 3.8  .  Smokeless tobacco: Never Used  Substance and Sexual Activity  . Alcohol use: Yes    Alcohol/week: 0.0 standard drinks  . Drug use: No  . Sexual activity: Yes  Other Topics Concern  . Not on file  Social History Narrative  . Not on file   Social Determinants of Health   Financial Resource Strain:   . Difficulty of Paying Living Expenses:   Food Insecurity:   . Worried About Programme researcher, broadcasting/film/video in the Last Year:   . Barista in the Last Year:   Transportation Needs:   . Freight forwarder (Medical):   Marland Kitchen Lack of Transportation (Non-Medical):   Physical Activity:   . Days of  Exercise per Week:   . Minutes of Exercise per Session:   Stress:   . Feeling of Stress :   Social Connections:   . Frequency of Communication with Friends and Family:   . Frequency of Social Gatherings with Friends and Family:   . Attends Religious Services:   . Active Member of Clubs or Organizations:   . Attends Banker Meetings:   Marland Kitchen Marital Status:      Family History: The patient's family history includes Heart disease in her mother; Hypertension in her mother. ROS:   Please see the history of present illness.    All other systems reviewed and are negative.  EKGs/Labs/Other Studies Reviewed:    The following studies were reviewed today:  I reviewed her cardia strips in excess of 20 posterior sinus rhythm some of artifact 1 was called atrial fibrillation it was incorrect there are occasional APCs.  Recent Labs: 09/14/2019: TSH 2.621 09/24/2019: Hemoglobin 11.0; Platelets 376 09/28/2019: BUN 7; Creatinine, Ser 0.76; Magnesium 2.0; Potassium 4.1; Sodium 141  Recent Lipid Panel No results found for: CHOL, TRIG, HDL, CHOLHDL, VLDL, LDLCALC, LDLDIRECT  Physical Exam:    VS:  There were no vitals taken for this visit.    Wt Readings from Last 3 Encounters:  10/02/19 195 lb (88.5 kg)  09/27/19 195 lb (88.5 kg)  05/01/19 215 lb (97.5 kg)     GEN:  Well nourished, well developed in no acute distress HEENT: Normal NECK: No JVD; No carotid bruits LYMPHATICS: No lymphadenopathy CARDIAC: RRR, no murmurs, rubs, gallops RESPIRATORY:  Clear to auscultation without rales, wheezing or rhonchi  ABDOMEN: Soft, non-tender, non-distended MUSCULOSKELETAL:  No edema; No deformity  SKIN: Warm and dry NEUROLOGIC:  Alert and oriented x 3 PSYCHIATRIC:  Normal affect    Signed, Norman Herrlich, MD  10/04/2019 4:35 PM    [facility]

## 2019-10-04 ENCOUNTER — Ambulatory Visit: Payer: BC Managed Care – PPO | Admitting: Cardiology

## 2019-10-04 ENCOUNTER — Other Ambulatory Visit: Payer: Self-pay

## 2019-10-04 ENCOUNTER — Encounter: Payer: Self-pay | Admitting: Cardiology

## 2019-10-04 DIAGNOSIS — I491 Atrial premature depolarization: Secondary | ICD-10-CM | POA: Diagnosis not present

## 2019-10-04 DIAGNOSIS — I10 Essential (primary) hypertension: Secondary | ICD-10-CM | POA: Diagnosis not present

## 2019-10-04 NOTE — Patient Instructions (Signed)

## 2019-10-16 ENCOUNTER — Encounter (HOSPITAL_COMMUNITY): Payer: BLUE CROSS/BLUE SHIELD

## 2019-10-16 ENCOUNTER — Other Ambulatory Visit (HOSPITAL_COMMUNITY): Payer: BLUE CROSS/BLUE SHIELD

## 2019-12-25 ENCOUNTER — Ambulatory Visit: Payer: Self-pay | Admitting: Family Medicine

## 2019-12-25 ENCOUNTER — Telehealth: Payer: Self-pay | Admitting: Family Medicine

## 2019-12-25 NOTE — Telephone Encounter (Signed)
Pt reports multiple symptoms since April. States saw cardiologist who suggested she see PCP "Might be anemia." Pt reports extreme fatigue "By end of day; I'm good in the mornings." Also reports fingertips blue at times and hands and feet numb at times, "Almost daily." Reports bilateral hip pain "Goes across back now for a couple days." Reports "Trouble with finding words at times." States "I have just not felt well for som time and need to find out what's wrong with me.' States had been started on new meds by cardiologist but have since been adjusted. Denies dizziness, headache, fever. Pt called during lunch hour. Care advise given, verbalizes understanding. Please advise regarding appt: [phone]  Reason for Disposition . Fatigue is a chronic symptom (recurrent or ongoing AND present > 4 weeks)  Answer Assessment - Initial Assessment Questions 1. DESCRIPTION: "Describe how you are feeling."     Weak, fatigued , hands feet numb at times 2. SEVERITY: "How bad is it?"  "Can you stand and walk?"   - MILD - Feels weak or tired, but does not interfere with work, school or normal activities   - MODERATE - Able to stand and walk; weakness interferes with work, school, or normal activities   - SEVERE - Unable to stand or walk     Moderate by end of day. 3. ONSET:  "When did the weakness begin?"     In April 4. CAUSE: "What do you think is causing the weakness?"    Unsure, was told maybe anemia. 5. MEDICINES: "Have you recently started a new medicine or had a change in the amount of a medicine?"     Different meds from cardiologist, adjusted 6. OTHER SYMPTOMS: "Do you have any other symptoms?" (e.g., chest pain, fever, cough, SOB, vomiting, diarrhea, bleeding, other areas of pain)    Hip and lower back pain, finger tips blue at times, "Trouble with finding  words at times." Hands and feet numb at times.  Protocols used: WEAKNESS (GENERALIZED) AND FATIGUE-A-AH

## 2019-12-25 NOTE — Telephone Encounter (Signed)
Pt states her finger tips have been turning blue and she hasnt felt well/ this weekend he had leg and hip pian and wants to know if this stuff is related to her finding out she was anemic / Pt states her cardiologist told her to follow up with Dr. [name] and have blood work done  / please advise

## 2019-12-26 NOTE — Telephone Encounter (Signed)
Pt needs an appt for possible anemia and fatigue

## 2019-12-26 NOTE — Telephone Encounter (Signed)
This is now being handled on a different message thread

## 2019-12-26 NOTE — Telephone Encounter (Signed)
Called pt and sch appt for 01/02/20

## 2020-01-02 ENCOUNTER — Encounter: Payer: Self-pay | Admitting: Family Medicine

## 2020-01-02 ENCOUNTER — Other Ambulatory Visit: Payer: Self-pay

## 2020-01-02 ENCOUNTER — Ambulatory Visit: Payer: Managed Care, Other (non HMO) | Admitting: Family Medicine

## 2020-01-02 DIAGNOSIS — Z130 Encounter for screening for diseases of the blood and blood-forming organs and certain disorders involving the immune mechanism: Secondary | ICD-10-CM | POA: Diagnosis not present

## 2020-01-02 DIAGNOSIS — R413 Other amnesia: Secondary | ICD-10-CM | POA: Diagnosis not present

## 2020-01-02 DIAGNOSIS — H5462 Unqualified visual loss, left eye, normal vision right eye: Secondary | ICD-10-CM | POA: Diagnosis not present

## 2020-01-02 LAB — POCT CBC
Granulocyte percent: 55.6 %G (ref 37–80)
HCT, POC: 35.4 % (ref 29–41)
Hemoglobin: 11.1 g/dL (ref 11–14.6)
Lymph, poc: 2.4 (ref 0.6–3.4)
MCH, POC: 24.7 pg — AB (ref 27–31.2)
MCHC: 31.3 g/dL — AB (ref 31.8–35.4)
MCV: 78.9 fL (ref 76–111)
MID (cbc): 0.2 (ref 0–0.9)
MPV: 6.5 fL (ref 0–99.8)
POC Granulocyte: 3.3 (ref 2–6.9)
POC LYMPH PERCENT: 40.3 %L (ref 10–50)
POC MID %: 4.1 %M (ref 0–12)
Platelet Count, POC: 418 10*3/uL (ref 142–424)
RBC: 4.49 M/uL (ref 4.04–5.48)
RDW, POC: 16 %
WBC: 6 10*3/uL (ref 4.6–10.2)

## 2020-01-02 NOTE — Progress Notes (Signed)
8/3/202111:36 AM  Brittney Bell [birth year], 49 y.o., female [id number]  Chief Complaint  Patient presents with  . Anemia    wants to make sure she is not anemic, having pain in the back of the head and sleeping alot    HPI:   Patient is a 49 y.o. female with past medical history significant for HTN, PACs, who presents today for concerns of fatigue/anemia  Last OV in 2019 Last saw cards in April 2021 - changed BB to dilt Normal stress test may 2021 Normal echo April 2021  Patient reports that for past several months she has been having memory loss, missed her own drive way, forgot her pin, forgetting conversation, tired all the time Having left eye blurry vision associated with occipital left sided headache (new onset) - dull pain, no photophobia, no phonophobia, no nausea or vomiting, no auras, gets them 2-3 a month.  Her vision continues to be blurry even when she does not have headaches She had eye exam at [facility], April 2021, found to have significant loss of vision of left eye Denies jaw claudication Has been having increased hip pain She does not snore, she has sometimes problems with insomnia No restricted diet, she has noticed decreased appetite Regular menses, normal flow, 2-4 days Denies any reflux, abd pain, nausea, vomiting, normal stools, no melena, no bright red blood in stool Feels her balance if off, bumping into objects, feels she leans into things, no dizziness, no LOC, focal weakness, sometimes feels like she slurring her words She has noticed her nails going blue, happens daily, gets warm fuzzy feeling, get lightheaded, no SOB, last about 30 mins, feels hand and feet are always cold, no pain Neg CTA lung in April 2021 Quit smoking about 4 years ago  Lab Results  Component Value Date   CREATININE 0.76 09/28/2019   BUN 7 09/28/2019   NA 141 09/28/2019   K 4.1 09/28/2019   CL 106 09/28/2019   CO2 20 09/28/2019   CBC Latest Ref Rng & Units  09/24/2019 09/13/2019 05/20/2017  WBC 4.0 - 10.5 K/uL 7.4 7.9 8.3  Hemoglobin 12.0 - 15.0 g/dL 11.0(L) 10.2(L) 12.6  Hematocrit 36 - 46 % 35.0(L) 34.0(L) 38.2  Platelets 150 - 400 K/uL 376 398 363    Lab Results  Component Value Date   TSH 2.621 09/14/2019   GAD 7 : Generalized Anxiety Score 01/02/2020  Nervous, Anxious, on Edge 1  Control/stop worrying 1  Worry too much - different things 2  Trouble relaxing 0  Restless 0  Easily annoyed or irritable 0  Afraid - awful might happen 1  Total GAD 7 Score 5  Anxiety Difficulty Not difficult at all     Depression screen [facility] 2/9 03/08/2018 12/07/2017 06/03/2017  Decreased Interest 0 0 0  Down, Depressed, Hopeless 0 0 0  PHQ - 2 Score 0 0 0  Altered sleeping - - -  Tired, decreased energy - - -  Change in appetite - - -  Feeling bad or failure about yourself  - - -  Trouble concentrating - - -  Moving slowly or fidgety/restless - - -  Suicidal thoughts - - -  PHQ-9 Score - - -  Difficult doing work/chores - - -    Fall Risk  03/08/2018 12/07/2017 06/03/2017 05/20/2017 05/15/2017  Falls in the past year? No No No No No     Allergies  Allergen Reactions  . Azithromycin Hives  . Tizanidine Other (  See Comments)    Hypotension, severe 80/50s  . Levaquin [Levofloxacin] Palpitations    Prior to Admission medications   Medication Sig Start Date End Date Taking? Authorizing Provider  acetaminophen (TYLENOL) 500 MG tablet Take 1,000-1,500 mg by mouth every 8 (eight) hours as needed for mild pain or headache.    [provider]  diltiazem (CARDIZEM CD) 120 MG 24 hr capsule Take 1 capsule (120 mg total) by mouth daily. 09/27/19 12/26/19  Tobb, Kardie, DO  ibuprofen (ADVIL) 200 MG tablet Take 600-800 mg by mouth every 6 (six) hours as needed for headache or moderate pain.    [provider]    Past Medical History:  Diagnosis Date  . Anxiety   . Anxiety state 03/17/2012   Overview:  10/1 IMO update  . Cellulitis and  abscess of face 01/29/2013  . Essential hypertension, benign 05/15/2017  . Generalized anxiety disorder 05/20/2017  . Hypertension   . Low back pain     Past Surgical History:  Procedure Laterality Date  . TUBAL LIGATION      Social History   Tobacco Use  . Smoking status: Former Smoker    Quit date: 11/30/2015    Years since quitting: 4.0  . Smokeless tobacco: Never Used  Substance Use Topics  . Alcohol use: Yes    Alcohol/week: 0.0 standard drinks    Family History  Problem Relation Age of Onset  . Heart disease Mother   . Hypertension Mother     ROS Per hpi  OBJECTIVE:  Today's Vitals   01/02/20 1128  BP: 139/85  Pulse: 77  Temp: 98.5 F (36.9 C)  SpO2: 100%  Weight: 197 lb 12.8 oz (89.7 kg)  Height: 5\' 7"  (1.702 m)   Body mass index is 30.98 kg/m.   Physical Exam Vitals and nursing note reviewed.  Constitutional:      Appearance: She is well-developed.  HENT:     Head: Normocephalic and atraumatic.     Comments: TA pulsatile, non tender    Right Ear: Hearing, tympanic membrane, ear canal and external ear normal.     Left Ear: Hearing, tympanic membrane, ear canal and external ear normal.     Mouth/Throat:     Mouth: Mucous membranes are moist.     Pharynx: No oropharyngeal exudate or posterior oropharyngeal erythema.  Eyes:     Extraocular Movements: Extraocular movements intact.     Conjunctiva/sclera: Conjunctivae normal.     Pupils: Pupils are equal, round, and reactive to light.  Neck:     Thyroid: No thyromegaly.  Cardiovascular:     Rate and Rhythm: Normal rate and regular rhythm.     Heart sounds: Normal heart sounds. No murmur heard.  No friction rub. No gallop.   Pulmonary:     Effort: Pulmonary effort is normal.     Breath sounds: Normal breath sounds. No wheezing, rhonchi or rales.  Abdominal:     General: Bowel sounds are normal. There is no distension.     Palpations: Abdomen is soft. There is no hepatomegaly, splenomegaly or  mass.     Tenderness: There is no abdominal tenderness.  Musculoskeletal:        General: Normal range of motion.     Cervical back: Neck supple. Muscular tenderness present. No spinous process tenderness.     Right lower leg: No edema.     Left lower leg: No edema.  Lymphadenopathy:     Cervical: No cervical adenopathy.  Skin:  General: Skin is warm and dry.  Neurological:     Mental Status: She is alert and oriented to person, place, and time.     Cranial Nerves: Cranial nerves are intact.     Sensory: Sensation is intact.     Motor: Pronator drift (LUE, minimal) present. No weakness or abnormal muscle tone.     Coordination: Romberg sign positive. Finger-Nose-Finger Test abnormal. Rapid alternating movements normal.     Gait: Gait normal.     Deep Tendon Reflexes: Reflexes are normal and symmetric.  Psychiatric:        Mood and Affect: Mood normal.        Behavior: Behavior normal.     Hearing Screening   125Hz  250Hz  500Hz  1000Hz  2000Hz  3000Hz  4000Hz  6000Hz  8000Hz   Right ear:           Left ear:             Visual Acuity Screening   Right eye Left eye Both eyes  Without correction: 0 20/100 20/100  With correction: 20/40 20/25 20/25     Results for orders placed or performed in visit on 01/02/20 (from the past 24 hour(s))  POCT CBC     Status: Abnormal   Collection Time: 01/02/20 11:43 AM  Result Value Ref Range   WBC 6.0 4.6 - 10.2 K/uL   Lymph, poc 2.4 0.6 - 3.4   POC LYMPH PERCENT 40.3 10 - 50 %L   MID (cbc) 0.2 0 - 0.9   POC MID % 4.1 0 - 12 %M   POC Granulocyte 3.3 2 - 6.9   Granulocyte percent 55.6 37 - 80 %G   RBC 4.49 4.04 - 5.48 M/uL   Hemoglobin 11.1 11 - 14.6 g/dL   HCT, POC 29 - 41 %   MCV 78.9 76 - 111 fL   MCH, POC 24.7 (A) 27 - 31.2 pg   MCHC 31.3 (A) 31.8 - 35.4 g/dL   RDW, POC %   Platelet Count, POC 418 142 - 424 K/uL   MPV 6.5 0 - 99.8 fL    No results found.   ASSESSMENT and PLAN  1. Memory changes 2. Decreased vision of  left eye Labs and MRI pending to r/o organic causes. Will request records from [facility] - Comprehensive metabolic panel - Vitamin B12 - Sedimentation Rate - C-reactive protein - TSH - Hemoglobin A1c - ANA W/Rfx to all if Positive - MR Brain W Wo Contrast; Future  3. Screening for deficiency anemia - POCT CBC - stable h/h - Iron, TIBC and Ferritin Panel  Return in about 2 weeks (around 01/16/2020).    , MD Primary Care at [facility] [address] [place], 03/03/20 Ph.  [phone] Fax [phone]

## 2020-01-02 NOTE — Patient Instructions (Signed)
° ° ° °  If you have lab work done today you will be contacted with your lab results within the next 2 weeks.  If you have not heard from us then please contact us. The fastest way to get your results is to register for My Chart. ° ° °IF you received an x-ray today, you will receive an invoice from [facility]. Please contact [facility] at [phone] with questions or concerns regarding your invoice.  ° °IF you received labwork today, you will receive an invoice from [facility]. Please contact [facility] at [phone] with questions or concerns regarding your invoice.  ° °Our billing staff will not be able to assist you with questions regarding bills from these companies. ° °You will be contacted with the lab results as soon as they are available. The fastest way to get your results is to activate your My Chart account. Instructions are located on the last page of this paperwork. If you have not heard from us regarding the results in 2 weeks, please contact this office. °  ° ° ° °

## 2020-01-03 ENCOUNTER — Encounter: Payer: Self-pay | Admitting: Family Medicine

## 2020-01-03 DIAGNOSIS — H5462 Unqualified visual loss, left eye, normal vision right eye: Secondary | ICD-10-CM

## 2020-01-03 LAB — COMPREHENSIVE METABOLIC PANEL
ALT: 8 IU/L (ref 0–32)
AST: 12 IU/L (ref 0–40)
Albumin/Globulin Ratio: 1.5 (ref 1.2–2.2)
Albumin: 4.8 g/dL (ref 3.8–4.8)
Alkaline Phosphatase: 94 IU/L (ref 48–121)
BUN/Creatinine Ratio: 15 (ref 9–23)
BUN: 11 mg/dL (ref 6–24)
Bilirubin Total: 0.4 mg/dL (ref 0.0–1.2)
CO2: 21 mmol/L (ref 20–29)
Calcium: 9.4 mg/dL (ref 8.7–10.2)
Chloride: 103 mmol/L (ref 96–106)
Creatinine, Ser: 0.75 mg/dL (ref 0.57–1.00)
GFR calc Af Amer: 107 mL/min/{1.73_m2} (ref >59)
GFR calc non Af Amer: 93 mL/min/{1.73_m2} (ref >59)
Globulin, Total: 3.1 g/dL (ref 1.5–4.5)
Glucose: 102 mg/dL — ABNORMAL HIGH (ref 65–99)
Potassium: 4.6 mmol/L (ref 3.5–5.2)
Sodium: 139 mmol/L (ref 134–144)
Total Protein: 7.9 g/dL (ref 6.0–8.5)

## 2020-01-03 LAB — SEDIMENTATION RATE: Sed Rate: 10 mm/hr (ref 0–40)

## 2020-01-03 LAB — IRON,TIBC AND FERRITIN PANEL
Ferritin: 7 ng/mL — ABNORMAL LOW (ref 15–150)
Iron Saturation: 3 % — CL (ref 15–55)
Iron: 18 ug/dL — ABNORMAL LOW (ref 27–159)
Total Iron Binding Capacity: 538 ug/dL — ABNORMAL HIGH (ref 250–450)
UIBC: 520 ug/dL — ABNORMAL HIGH (ref 131–425)

## 2020-01-03 LAB — HEMOGLOBIN A1C
Est. average glucose Bld gHb Est-mCnc: 105 mg/dL
Hgb A1c MFr Bld: 5.3 % (ref 4.8–5.6)

## 2020-01-03 LAB — VITAMIN B12: Vitamin B-12: 444 pg/mL (ref 232–1245)

## 2020-01-03 LAB — TSH: TSH: 2.16 u[IU]/mL (ref 0.450–4.500)

## 2020-01-03 LAB — ANA W/RFX TO ALL IF POSITIVE: Anti Nuclear Antibody (ANA): NEGATIVE

## 2020-01-03 LAB — C-REACTIVE PROTEIN: CRP: 2 mg/L (ref 0–10)

## 2020-01-03 NOTE — Telephone Encounter (Signed)
Did you request we obtain records from [facility] on Ms.  Brittney Bell? I did not find a signed form in [place] faxed items and do not see any notes regarding the request

## 2020-01-04 ENCOUNTER — Encounter: Payer: Self-pay | Admitting: Family Medicine

## 2020-01-09 ENCOUNTER — Other Ambulatory Visit: Payer: Self-pay | Admitting: Family Medicine

## 2020-01-09 ENCOUNTER — Telehealth: Payer: Self-pay | Admitting: Family Medicine

## 2020-01-09 MED ORDER — FERROUS GLUCONATE 324 (38 FE) MG PO TABS: 324.0000 mg | ORAL_TABLET | Freq: Every day | ORAL | 1 refills | Status: DC

## 2020-01-09 NOTE — Telephone Encounter (Signed)
Pt is calling and wants to talk to someone in clinical  wanting to let provider know that her MRI is not until the 27th.  but she  has seen ophthalmologist and he could and she didn't so well with that and she failed . Pt is noticing she is having additional vision loss problems .and having trouble choosing certain words or describing things . And memory loss   Wants clarification / noticed that there is a prescription called in for iron .will low cause her to have these problems ?and  Wants to see if we can speed up MRI  For her    She is kind of rambling .

## 2020-01-09 NOTE — Telephone Encounter (Signed)
Can we see if she can be scheduled for MRI sooner at another site? thanks

## 2020-01-10 NOTE — Telephone Encounter (Signed)
Spoke to patient and her appt has been rescheduled to 01/11/20 @4 :00. Patient wanted to know what will be the next step after MRI. She was informed once Dr review her results we will give her a call and go the results of the MRI. If normal we will see her at her 8/30 appt she have scheduled a f/u

## 2020-01-11 ENCOUNTER — Ambulatory Visit
Admission: RE | Admit: 2020-01-11 | Discharge: 2020-01-11 | Disposition: A | Discharge status: Home / Self Care | Payer: Managed Care, Other (non HMO) | Source: Ambulatory Visit | Attending: Family Medicine | Admitting: Family Medicine

## 2020-01-11 DIAGNOSIS — H5462 Unqualified visual loss, left eye, normal vision right eye: Secondary | ICD-10-CM

## 2020-01-11 DIAGNOSIS — R413 Other amnesia: Secondary | ICD-10-CM

## 2020-01-11 MED ORDER — GADOBENATE DIMEGLUMINE 529 MG/ML IV SOLN
15.0000 mL | Freq: Once | INTRAVENOUS | Status: AC | PRN
  Administered 2020-01-11: 15 mL via INTRAVENOUS

## 2020-01-14 NOTE — Addendum Note (Signed)
Addended by: Myles Lipps on: 01/14/2020 08:10 PM   Modules accepted: Orders

## 2020-01-15 ENCOUNTER — Encounter: Payer: Self-pay | Admitting: Neurology

## 2020-01-16 ENCOUNTER — Ambulatory Visit: Payer: Managed Care, Other (non HMO) | Admitting: Family Medicine

## 2020-01-22 ENCOUNTER — Other Ambulatory Visit: Payer: Self-pay

## 2020-01-22 ENCOUNTER — Ambulatory Visit
Admission: RE | Admit: 2020-01-22 | Discharge: 2020-01-22 | Disposition: A | Discharge status: Home / Self Care | Payer: Managed Care, Other (non HMO) | Source: Ambulatory Visit | Attending: Family Medicine | Admitting: Family Medicine

## 2020-01-22 ENCOUNTER — Other Ambulatory Visit: Payer: Self-pay | Admitting: Family Medicine

## 2020-01-22 DIAGNOSIS — Z1231 Encounter for screening mammogram for malignant neoplasm of breast: Secondary | ICD-10-CM

## 2020-01-26 ENCOUNTER — Other Ambulatory Visit: Payer: BC Managed Care – PPO

## 2020-01-29 ENCOUNTER — Other Ambulatory Visit: Payer: Self-pay

## 2020-01-29 ENCOUNTER — Ambulatory Visit: Payer: Managed Care, Other (non HMO) | Admitting: Family Medicine

## 2020-01-29 ENCOUNTER — Encounter: Payer: Self-pay | Admitting: Family Medicine

## 2020-01-29 VITALS — BP 126/79 | HR 79 | Temp 99.0°F | Ht 67.0 in | Wt 201.0 lb

## 2020-01-29 DIAGNOSIS — R413 Other amnesia: Secondary | ICD-10-CM | POA: Diagnosis not present

## 2020-01-29 DIAGNOSIS — N6310 Unspecified lump in the right breast, unspecified quadrant: Secondary | ICD-10-CM

## 2020-01-29 NOTE — Progress Notes (Signed)
8/30/202110:53 AM  Brittney Bell 1971-04-08, 49 y.o., female [id number]  Chief Complaint  Patient presents with  . Results    wants to talk about brain mri and also did mammo 8/23 there was nothing found. She feels that there is pea size know on the right side of the breast and something going on with her right nipple.    HPI:   Patient is a 49 y.o. female with past medical history significant for HTN, PACs who presents today for MRI results  Sees neuro on sept 8th 2021  MRI results reviewed today  Also having concerns for right breast tender lump with nipple changes present for past 2-3 months She did not say anything during screening mammo as did not want to incure cost  Screening mammo normal She denies any changes to lump She denies any nipple discharge She denies any redness, warmth or overall changes of overlying skin She has noticed a new divet in her nipple as if being tugged to side of lump  Depression screen [facility] 2/9 03/08/2018 12/07/2017 06/03/2017  Decreased Interest 0 0 0  Down, Depressed, Hopeless 0 0 0  PHQ - 2 Score 0 0 0  Altered sleeping - - -  Tired, decreased energy - - -  Change in appetite - - -  Feeling bad or failure about yourself  - - -  Trouble concentrating - - -  Moving slowly or fidgety/restless - - -  Suicidal thoughts - - -  PHQ-9 Score - - -  Difficult doing work/chores - - -    Fall Risk  01/29/2020 03/08/2018 12/07/2017 06/03/2017 05/20/2017  Falls in the past year? 1 No No No No  Number falls in past yr: 0 - - - -  Injury with Fall? 1 - - - -  Risk for fall due to : Impaired mobility;Other (Comment) - - - -     Allergies  Allergen Reactions  . Azithromycin Hives  . Tizanidine Other (See Comments)    Hypotension, severe 80/50s  . Levaquin [Levofloxacin] Palpitations    Prior to Admission medications   Medication Sig Start Date End Date Taking? Authorizing Provider  acetaminophen (TYLENOL) 500 MG tablet Take 1,000-1,500 mg by  mouth every 8 (eight) hours as needed for mild pain or headache.   Yes [provider]  ferrous gluconate (FERGON) 324 MG tablet Take 1 tablet (324 mg total) by mouth daily with breakfast. 01/09/20  Yes Myles Lipps, MD  ibuprofen (ADVIL) 200 MG tablet Take 600-800 mg by mouth every 6 (six) hours as needed for headache or moderate pain.   Yes [provider]  diltiazem (CARDIZEM CD) 120 MG 24 hr capsule Take 1 capsule (120 mg total) by mouth daily. 09/27/19 12/26/19  Thomasene Ripple, DO    Past Medical History:  Diagnosis Date  . Anxiety   . Anxiety state 03/17/2012   Overview:  10/1 IMO update  . Cellulitis and abscess of face 01/29/2013  . Essential hypertension, benign 05/15/2017  . Generalized anxiety disorder 05/20/2017  . Hypertension   . Low back pain     Past Surgical History:  Procedure Laterality Date  . TUBAL LIGATION      Social History   Tobacco Use  . Smoking status: Former Smoker    Quit date: 11/30/2015    Years since quitting: 4.1  . Smokeless tobacco: Never Used  Substance Use Topics  . Alcohol use: Yes    Alcohol/week: 0.0 standard drinks  Family History  Problem Relation Age of Onset  . Heart disease Mother   . Hypertension Mother     ROS Per hpi  OBJECTIVE:  Today's Vitals   01/29/20 1043  BP: 126/79  Pulse: 79  Temp: 99 F (37.2 C)  SpO2: 99%  Weight: 201 lb (91.2 kg)  Height: 5\' 7"  (1.702 m)   Body mass index is 31.48 kg/m.   Physical Exam Vitals and nursing note reviewed. Exam conducted with a chaperone present.  Constitutional:      Appearance: She is well-developed.  HENT:     Head: Normocephalic and atraumatic.  Eyes:     General: No scleral icterus.    Conjunctiva/sclera: Conjunctivae normal.     Pupils: Pupils are equal, round, and reactive to light.  Pulmonary:     Effort: Pulmonary effort is normal.  Chest:     Breasts:        Right: Mass (along 9 oclock a tender, mobile 38mm mass) and tenderness  present. No inverted nipple, nipple discharge or skin change.        Left: Normal.  Musculoskeletal:     Cervical back: Neck supple.  Lymphadenopathy:     Upper Body:     Right upper body: No supraclavicular, axillary or pectoral adenopathy.     Left upper body: No supraclavicular, axillary or pectoral adenopathy.  Skin:    General: Skin is warm and dry.  Neurological:     Mental Status: She is alert and oriented to person, place, and time.     No results found for this or any previous visit (from the past 24 hour(s)).  No results found.   ASSESSMENT and PLAN  1. Breast mass, right On exam favor benign, imaging to further evaluate - MM DIAG BREAST TOMO UNI RIGHT; Future - 1m BREAST LTD UNI RIGHT INC AXILLA; Future  2. Memory changes MRI and labs have been reassuring. Neuro appt pending. Favor anxiety.   No follow-ups on file.    [place], MD Primary Care at [facility] [address] [place], [place] Ph.  [phone] Fax [phone]

## 2020-01-29 NOTE — Patient Instructions (Signed)
° ° ° °  If you have lab work done today you will be contacted with your lab results within the next 2 weeks.  If you have not heard from us then please contact us. The fastest way to get your results is to register for My Chart. ° ° °IF you received an x-ray today, you will receive an invoice from [facility]. Please contact [facility] at [phone] with questions or concerns regarding your invoice.  ° °IF you received labwork today, you will receive an invoice from [facility]. Please contact [facility] at [phone] with questions or concerns regarding your invoice.  ° °Our billing staff will not be able to assist you with questions regarding bills from these companies. ° °You will be contacted with the lab results as soon as they are available. The fastest way to get your results is to activate your My Chart account. Instructions are located on the last page of this paperwork. If you have not heard from us regarding the results in 2 weeks, please contact this office. °  ° ° ° °

## 2020-01-30 ENCOUNTER — Ambulatory Visit: Payer: Managed Care, Other (non HMO)

## 2020-01-30 ENCOUNTER — Encounter: Payer: Self-pay | Admitting: Family Medicine

## 2020-02-06 NOTE — Progress Notes (Signed)
NEUROLOGY CONSULTATION NOTE  Brittney Bell MRN: [id number] DOB: 1971/05/06  Referring provider:  Koren Shiver, MD Primary care provider: Koren Shiver, MD  Reason for consult:  Memory problems, vision changes.  HISTORY OF PRESENT ILLNESS: Brittney Bell is a 49 year old right-handed female with HTN and PACs who presents for above complaints.  History supplemented by ophthalmology and referring provider's notes.  She also reports memory problems since October, specifically short-term memory.  No preceding head injury or COVID.  However, she reports symptoms got worse since getting the vaccine in the Spring.  She says she has trouble remembering the name of her place of work.  She also forgot the pin to the alarm.  She now uses her GPS when driving. She missed her own driveway.  She was at [employer] looking at socks and she couldn't remember or process why she was there.  She also reports word-finding difficulty.     She has trouble prioritizing her work. She also reports trouble remembering her children's names.  However, she is able to still recall numbers, such as phone numbers.  Long-term memory is okay.    She also reports constant ringing in her ears.  She is also tremulous and feels off-balance, usually leaning towards the left.  She fell a couple of times.  If she gets up from the bed too quick, she may get dizzy and fall.  Sometimes she feels off-balance in the shower.  At night, she sometimes slurs her speech.  She also feels more irritable and may get upset easily.  She also reports that she has "no-filter" and says what is on her mind.      She reports peripheral vision loss in her left eye.  She had an eye exam at [facility] in April and was found to have significant vision loss in the left eye.  She followed up with ophthalmology showed peripheral vision loss but otherwise unremarkable.    MRI of brain with and without contrast on 01/12/2020 personally reviewed showed mild  chronic small vessel ischemic changes within the cerebral white matter bilaterally but otherwise unremarkable.  Labs from August included sed rate 10, CRP 2, negative ANA, Hgb A1c 5.3, TSH 2.160, and B12 444.      PAST MEDICAL HISTORY: Past Medical History:  Diagnosis Date  . Anxiety   . Anxiety state 03/17/2012   Overview:  10/1 IMO update  . Cellulitis and abscess of face 01/29/2013  . Essential hypertension, benign 05/15/2017  . Generalized anxiety disorder 05/20/2017  . Hypertension   . Low back pain     PAST SURGICAL HISTORY: Past Surgical History:  Procedure Laterality Date  . TUBAL LIGATION      MEDICATIONS: Current Outpatient Medications on File Prior to Visit  Medication Sig Dispense Refill  . acetaminophen (TYLENOL) 500 MG tablet Take 1,000-1,500 mg by mouth every 8 (eight) hours as needed for mild pain or headache.    . diltiazem (CARDIZEM CD) 120 MG 24 hr capsule Take 1 capsule (120 mg total) by mouth daily. 90 capsule 1  . ferrous gluconate (FERGON) 324 MG tablet Take 1 tablet (324 mg total) by mouth daily with breakfast. 90 tablet 1  . ibuprofen (ADVIL) 200 MG tablet Take 600-800 mg by mouth every 6 (six) hours as needed for headache or moderate pain.     No current facility-administered medications on file prior to visit.    ALLERGIES: Allergies  Allergen Reactions  . Azithromycin Hives  .  Tizanidine Other (See Comments)    Hypotension, severe 80/50s  . Levaquin [Levofloxacin] Palpitations    FAMILY HISTORY: Family History  Problem Relation Age of Onset  . Heart disease Mother   . Hypertension Mother     SOCIAL HISTORY: Social History   Socioeconomic History  . Marital status: Divorced    Spouse name: Not on file  . Number of children: Not on file  . Years of education: Not on file  . Highest education level: Not on file  Occupational History  . Not on file  Tobacco Use  . Smoking status: Former Smoker    Quit date: 11/30/2015    Years  since quitting: 4.1  . Smokeless tobacco: Never Used  Substance and Sexual Activity  . Alcohol use: Yes    Alcohol/week: 0.0 standard drinks  . Drug use: No  . Sexual activity: Yes  Other Topics Concern  . Not on file  Social History Narrative  . Not on file   Social Determinants of Health   Financial Resource Strain:   . Difficulty of Paying Living Expenses: Not on file  Food Insecurity:   . Worried About Programme researcher, broadcasting/film/video in the Last Year: Not on file  . Ran Out of Food in the Last Year: Not on file  Transportation Needs:   . Lack of Transportation (Medical): Not on file  . Lack of Transportation (Non-Medical): Not on file  Physical Activity:   . Days of Exercise per Week: Not on file  . Minutes of Exercise per Session: Not on file  Stress:   . Feeling of Stress : Not on file  Social Connections:   . Frequency of Communication with Friends and Family: Not on file  . Frequency of Social Gatherings with Friends and Family: Not on file  . Attends Religious Services: Not on file  . Active Member of Clubs or Organizations: Not on file  . Attends Banker Meetings: Not on file  . Marital Status: Not on file  Intimate Partner Violence:   . Fear of Current or Ex-Partner: Not on file  . Emotionally Abused: Not on file  . Physically Abused: Not on file  . Sexually Abused: Not on file     PHYSICAL EXAM: Blood pressure (!) 145/77, pulse 71, height 5\' 7"  (1.702 m), weight 199 lb 3.2 oz (90.4 kg), SpO2 100 %. General: Anxious.  Patient appears well-groomed.  Head:  Normocephalic/atraumatic Eyes:  fundi examined but not visualized Neck: supple, no paraspinal tenderness, full range of motion Back: No paraspinal tenderness Heart: regular rate and rhythm Lungs: Clear to auscultation bilaterally. Vascular: No carotid bruits. Neurological Exam: Mental status:  [employer] Mental Exam 02/07/2020  Weekday Correct 1  Current year 1  What state are we in? 1    Amount spent 1  Amount left 2  # of Animals 1  5 objects recall 2  Number series 2  Hour markers 2  Time correct 2  Placed X in triangle correctly 1  Largest Figure 1  Name of female 2  Date back to work 2  Type of work 11-13-1995 she lived in 0  Total score 21   Cranial nerves: CN I: not tested CN II: pupils equal, round and reactive to light, visual fields intact CN III, IV, VI:  full range of motion, no nystagmus, no ptosis CN V: facial sensation intact CN VII: upper and lower face symmetric CN VIII: hearing intact CN IX, X: gag  intact, uvula midline CN XI: sternocleidomastoid and trapezius muscles intact CN XII: tongue midline Bulk & Tone: normal, no fasciculations. Motor:  5/5 throughout.  Tremulous in hands when outstretched. Sensation:  Pinprick and vibration sensation intact.  Deep Tendon Reflexes:  2+ throughout, toes downgoing.  Finger to nose testing:  Without dysmetria.   Heel to shin:  Without dysmetria.   Gait:  Normal station and stride.  Able to turn and tandem walk. Romberg negative.  IMPRESSION: 1.  Memory problems 2.  Vision loss in left eye 3.  Tremors 4.  Tinnitus  I have no explanation for her visual symptoms.  In my opinion, MRI of the brain is unremarkable with no explainable cause.  Reportedly, formal eye exam did not reveal any objective abnormalities such as acute or chronic optic neuropathy.   PLAN: 1.  Neurocognitive testing 2.  Will obtain ophthalmology notes for review 3.  Follow up after testing  Thank you for allowing me to take part in the care of this patient.  Shon Millet, DO  CC: Koren Shiver, MD

## 2020-02-07 ENCOUNTER — Ambulatory Visit (INDEPENDENT_AMBULATORY_CARE_PROVIDER_SITE_OTHER): Payer: Managed Care, Other (non HMO) | Admitting: Neurology

## 2020-02-07 ENCOUNTER — Other Ambulatory Visit: Payer: Self-pay

## 2020-02-07 ENCOUNTER — Encounter: Payer: Self-pay | Admitting: Neurology

## 2020-02-07 VITALS — BP 145/77 | HR 71 | Ht 67.0 in | Wt 199.2 lb

## 2020-02-07 DIAGNOSIS — R413 Other amnesia: Secondary | ICD-10-CM | POA: Diagnosis not present

## 2020-02-07 DIAGNOSIS — H9313 Tinnitus, bilateral: Secondary | ICD-10-CM

## 2020-02-07 DIAGNOSIS — H539 Unspecified visual disturbance: Secondary | ICD-10-CM | POA: Diagnosis not present

## 2020-02-07 DIAGNOSIS — R251 Tremor, unspecified: Secondary | ICD-10-CM

## 2020-02-07 NOTE — Patient Instructions (Signed)
So far, MRI of brain, labs and eye exam are largely unrevealing.  I would like to order neurocognitive testing.  Follow up after testing.

## 2020-02-13 ENCOUNTER — Ambulatory Visit
Admission: RE | Admit: 2020-02-13 | Discharge: 2020-02-13 | Disposition: A | Discharge status: Home / Self Care | Payer: Managed Care, Other (non HMO) | Source: Ambulatory Visit | Attending: Family Medicine | Admitting: Family Medicine

## 2020-02-13 ENCOUNTER — Other Ambulatory Visit: Payer: Self-pay

## 2020-02-13 DIAGNOSIS — N6310 Unspecified lump in the right breast, unspecified quadrant: Secondary | ICD-10-CM

## 2020-02-23 ENCOUNTER — Ambulatory Visit: Payer: Self-pay | Admitting: Surgery

## 2020-02-23 NOTE — H&P (Signed)
History of Present Illness Brittney Bell. Kona Lover MD; 02/23/2020 9:56 AM) The patient is a 49 year old female who presents with a complaint of nipple discharge. Referred by Dr. Jackalyn Lombard for right nipple thickening and discharge.  This is a 49 year old female who had not had any breast cancer screening for about 9 or 10 years who recently underwent routine screening mammogram. This was unremarkable. However, she has noted that her right nipple has changed in appearance. She has also noticed some nonbloody discharge on her bra. She denies any significant pain associated with this area. The right nipple seems to have almost become a figure-of-eight shape with some firmness in the upper part. She underwent further workup with a diagnostic mammogram and ultrasound. This was unremarkable but the radiologist also noticed a small cystic papule of the upper right nipple. The retroareolar region shows some mild duct ectasia but otherwise unremarkable.  CLINICAL DATA: Palpable abnormality in the RIGHT breast since March. She reports fullness in the RIGHT axilla and changes in the RIGHT nipple. Patient had negative screening study 01/22/2020.  EXAM: DIGITAL DIAGNOSTIC RIGHT MAMMOGRAM WITH CAD AND TOMO  ULTRASOUND RIGHT BREAST  COMPARISON: 01/22/2020 and earlier  ACR Breast Density Category c: The breast tissue is heterogeneously dense, which may obscure small masses.  FINDINGS: Spot tangential view is performed, showing normal appearing fibroglandular tissue in the UPPER-OUTER QUADRANT of the RIGHT breast. No suspicious mass, distortion, or microcalcifications are identified to suggest presence of malignancy.  Mammographic images were processed with CAD.  On physical exam, I palpate no discrete mass in the UPPER-OUTER QUADRANT of the RIGHT breast. I palpate non focal soft thickening in this region.  I palpate no abnormality in the RIGHT axilla.  The RIGHT nipple is oval in shape and appears  larger than the left. The right nipple has a horizontal crease. The skin of the RIGHT nipple is red. There is a 2 millimeter papule along the inferior aspect of the RIGHT nipple. The patient denies any flaking or scaling changes, and there is no visible flaking today. She denies any discharge. The LEFT nipple is round, normal color, and unremarkable.  Targeted ultrasound is performed, showing normal appearing fibroglandular tissue in the UPPER-OUTER QUADRANT of the RIGHT breast in the area of patient's concern.  Evaluation of the RIGHT axilla is negative for adenopathy.  Evaluation of the retroareolar region shows mild duct ectasia, without intraductal mass or other suspicious abnormality.  IMPRESSION: 1. There is no imaging evidence for malignancy in the RIGHT breast. The areas of concern in the UPPER-OUTER QUADRANT and RIGHT axilla are negative. 2. Redness, change in morphology, and small papule of the RIGHT nipple warrant further evaluation.  RECOMMENDATION: Recommend surgical consultation regarding RIGHT nipple changes. Consider nipple biopsy.  I have discussed the findings and recommendations with the patient. If applicable, a reminder letter will be sent to the patient regarding the next appointment.  BI-RADS CATEGORY 1: Negative.  Electronically Signed: By: Norva Pavlov M.D. On: 02/13/2020 15:11   Problem List/Past Medical Molli Hazard K. Kenzy Campoverde, MD; 02/23/2020 9:56 AM) CYST OF RIGHT NIPPLE (N60.01)  Past Surgical History (Chanel Lonni Fix, CMA; 02/23/2020 9:24 AM) No pertinent past surgical history  Diagnostic Studies History (Chanel Lonni Fix, CMA; 02/23/2020 9:24 AM) Colonoscopy never Mammogram within last year Pap Smear >5 years ago  Allergies (Chanel Lonni Fix, CMA; 02/23/2020 9:26 AM) Levaquin *FLUOROQUINOLONES* tiZANidine HCl *CHEMICALS* Azithromycin *CHEMICALS* Allergies Reconciled  Medication History (Chanel Lonni Fix, CMA; 02/23/2020 9:27 AM) Cardizem  (120MG  Tablet, Oral) Active. Fergon (Oral) Specific  strength unknown - Active. Medications Reconciled  Social History Micheal Likens, CMA; 02/23/2020 9:24 AM) Alcohol use Occasional alcohol use. Caffeine use Coffee, Tea. No drug use Tobacco use Former smoker.  Family History Micheal Likens, CMA; 02/23/2020 9:24 AM) Alcohol Abuse Father. Colon Polyps Mother. Heart Disease Mother. Heart disease in female family member before age 47 Hypertension Mother. Migraine Headache Daughter. Respiratory Condition Mother.  Pregnancy / Birth History Micheal Likens, CMA; 02/23/2020 9:24 AM) Age at menarche 13 years. Contraceptive History Oral contraceptives. Gravida 3 Maternal age 24-20 Para 3 Regular periods  Other Problems Brittney Bell. Taha Dimond, MD; 02/23/2020 9:56 AM) Back Pain High blood pressure     Review of Systems (Chanel Nolan CMA; 02/23/2020 9:24 AM) General Present- Appetite Loss and Fatigue. Not Present- Chills, Fever, Night Sweats, Weight Gain and Weight Loss. Skin Not Present- Change in Wart/Mole, Dryness, Hives, Jaundice, New Lesions, Non-Healing Wounds, Rash and Ulcer. HEENT Present- Ringing in the Ears, Visual Disturbances and Wears glasses/contact lenses. Not Present- Earache, Hearing Loss, Hoarseness, Nose Bleed, Oral Ulcers, Seasonal Allergies, Sinus Pain, Sore Throat and Yellow Eyes. Respiratory Not Present- Bloody sputum, Chronic Cough, Difficulty Breathing, Snoring and Wheezing. Breast Present- Breast Pain and Skin Changes. Not Present- Breast Mass and Nipple Discharge. Cardiovascular Present- Leg Cramps, Palpitations, Rapid Heart Rate and Swelling of Extremities. Not Present- Chest Pain, Difficulty Breathing Lying Down and Shortness of Breath. Gastrointestinal Present- Nausea and Vomiting. Not Present- Abdominal Pain, Bloating, Bloody Stool, Change in Bowel Habits, Chronic diarrhea, Constipation, Difficulty Swallowing, Excessive gas, Gets full quickly at  meals, Hemorrhoids, Indigestion and Rectal Pain. Female Genitourinary Present- Frequency, Nocturia and Urgency. Not Present- Painful Urination and Pelvic Pain. Musculoskeletal Present- Back Pain. Not Present- Joint Pain, Joint Stiffness, Muscle Pain, Muscle Weakness and Swelling of Extremities. Neurological Present- Decreased Memory, Numbness, Tremor and Trouble walking. Not Present- Fainting, Headaches, Seizures, Tingling and Weakness. Endocrine Present- Cold Intolerance. Not Present- Excessive Hunger, Hair Changes, Heat Intolerance, Hot flashes and New Diabetes. Hematology Not Present- Blood Thinners, Easy Bruising, Excessive bleeding, Gland problems, HIV and Persistent Infections.  Vitals (Chanel Nolan CMA; 02/23/2020 9:27 AM) 02/23/2020 9:27 AM Weight: 199.5 lb Height: 67in Body Surface Area: 2.02 m Body Mass Index: 31.25 kg/m  Temp.: 97.33F  Pulse: 97 (Regular)  BP: 126/84(Sitting, Left Arm, Standard)        Physical Exam Molli Hazard K. Oumou Smead MD; 02/23/2020 9:59 AM)  The physical exam findings are as follows: Note:Constitutional: WDWN in NAD, conversant, no obvious deformities; resting comfortably Eyes: Pupils equal, round; sclera anicteric; moist conjunctiva; no lid lag HENT: Oral mucosa moist; good dentition Neck: No masses palpated, trachea midline; no thyromegaly Lungs: CTA bilaterally; normal respiratory effort Breasts: bilateral fibrocystic changes; no dominant masses; no axillary lymphadenopathy Left nipple appears normal with no masses or drainage Right nipple - no obvious drainage; different in shape compared to the left. The right nipple appears almost dumbbell shaped with a firm upper portion and a smaller less firm lower portion. There almost seems to be a cleft between these two areas. CV: Regular rate and rhythm; no murmurs; extremities well-perfused with no edema Abd: +bowel sounds, soft, non-tender, no palpable organomegaly; no palpable hernias Musc:  Normal gait; no apparent clubbing or cyanosis in extremities Lymphatic: No palpable cervical or axillary lymphadenopathy Skin: Warm, dry; no sign of jaundice Psychiatric - alert and oriented x 4; calm mood and affect    Assessment & Plan Molli Hazard K. Lorretta Kerce MD; 02/23/2020 10:00 AM)  CYST OF RIGHT NIPPLE (N60.01)  Current Plans Schedule  for Surgery - Excision of right nipple cyst. The surgical procedure has been discussed with the patient. Potential risks, benefits, alternative treatments, and expected outcomes have been explained. All of the patient's questions at this time have been answered. The likelihood of reaching the patient's treatment goal is good. The patient understand the proposed surgical procedure and wishes to proceed. Note:We will excise the portion of the nipple with the cystic abnormality. This will leave her nipple smaller but will hopefully stop the drainage and will clearly establish the diagnosis.  Brittney Bell. Corliss Skains, MD, [facility] Surgery  General/ Trauma Surgery   02/23/2020 10:00 AM

## 2020-02-23 NOTE — H&P (View-Only) (Signed)
History of Present Illness Brittney Bell. Brittney Pillard MD; 02/23/2020 9:56 AM) The patient is a 49 year old female who presents with a complaint of nipple discharge. Referred by Dr. Jackalyn Lombard for right nipple thickening and discharge.  This is a 49 year old female who had not had any breast cancer screening for about 9 or 10 years who recently underwent routine screening mammogram. This was unremarkable. However, she has noted that her right nipple has changed in appearance. She has also noticed some nonbloody discharge on her bra. She denies any significant pain associated with this area. The right nipple seems to have almost become a figure-of-eight shape with some firmness in the upper part. She underwent further workup with a diagnostic mammogram and ultrasound. This was unremarkable but the radiologist also noticed a small cystic papule of the upper right nipple. The retroareolar region shows some mild duct ectasia but otherwise unremarkable.  CLINICAL DATA: Palpable abnormality in the RIGHT breast since March. She reports fullness in the RIGHT axilla and changes in the RIGHT nipple. Patient had negative screening study 01/22/2020.  EXAM: DIGITAL DIAGNOSTIC RIGHT MAMMOGRAM WITH CAD AND TOMO  ULTRASOUND RIGHT BREAST  COMPARISON: 01/22/2020 and earlier  ACR Breast Density Category c: The breast tissue is heterogeneously dense, which may obscure small masses.  FINDINGS: Spot tangential view is performed, showing normal appearing fibroglandular tissue in the UPPER-OUTER QUADRANT of the RIGHT breast. No suspicious mass, distortion, or microcalcifications are identified to suggest presence of malignancy.  Mammographic images were processed with CAD.  On physical exam, I palpate no discrete mass in the UPPER-OUTER QUADRANT of the RIGHT breast. I palpate non focal soft thickening in this region.  I palpate no abnormality in the RIGHT axilla.  The RIGHT nipple is oval in shape and appears  larger than the left. The right nipple has a horizontal crease. The skin of the RIGHT nipple is red. There is a 2 millimeter papule along the inferior aspect of the RIGHT nipple. The patient denies any flaking or scaling changes, and there is no visible flaking today. She denies any discharge. The LEFT nipple is round, normal color, and unremarkable.  Targeted ultrasound is performed, showing normal appearing fibroglandular tissue in the UPPER-OUTER QUADRANT of the RIGHT breast in the area of patient's concern.  Evaluation of the RIGHT axilla is negative for adenopathy.  Evaluation of the retroareolar region shows mild duct ectasia, without intraductal mass or other suspicious abnormality.  IMPRESSION: 1. There is no imaging evidence for malignancy in the RIGHT breast. The areas of concern in the UPPER-OUTER QUADRANT and RIGHT axilla are negative. 2. Redness, change in morphology, and small papule of the RIGHT nipple warrant further evaluation.  RECOMMENDATION: Recommend surgical consultation regarding RIGHT nipple changes. Consider nipple biopsy.  I have discussed the findings and recommendations with the patient. If applicable, a reminder letter will be sent to the patient regarding the next appointment.  BI-RADS CATEGORY 1: Negative.  Electronically Signed: By: Norva Pavlov M.D. On: 02/13/2020 15:11   Problem List/Past Medical Brittney Hazard K. Maclovio Henson, MD; 02/23/2020 9:56 AM) CYST OF RIGHT NIPPLE (N60.01)  Past Surgical History (Brittney Bell, CMA; 02/23/2020 9:24 AM) No pertinent past surgical history  Diagnostic Studies History (Brittney Bell, CMA; 02/23/2020 9:24 AM) Colonoscopy never Mammogram within last year Pap Smear >5 years ago  Allergies (Brittney Bell, CMA; 02/23/2020 9:26 AM) Levaquin *FLUOROQUINOLONES* tiZANidine HCl *CHEMICALS* Azithromycin *CHEMICALS* Allergies Reconciled  Medication History (Brittney Bell, CMA; 02/23/2020 9:27 AM) Cardizem  (120MG  Tablet, Oral) Active. Fergon (Oral) Specific  strength unknown - Active. Medications Reconciled  Social History Brittney Bell, CMA; 02/23/2020 9:24 AM) Alcohol use Occasional alcohol use. Caffeine use Coffee, Tea. No drug use Tobacco use Former smoker.  Family History Brittney Bell, CMA; 02/23/2020 9:24 AM) Alcohol Abuse Father. Colon Polyps Mother. Heart Disease Mother. Heart disease in female family member before age 47 Hypertension Mother. Migraine Headache Daughter. Respiratory Condition Mother.  Pregnancy / Birth History Brittney Bell, CMA; 02/23/2020 9:24 AM) Age at menarche 13 years. Contraceptive History Oral contraceptives. Gravida 3 Maternal age 24-20 Para 3 Regular periods  Other Problems Brittney Bell. Brittney Kinslow, MD; 02/23/2020 9:56 AM) Back Pain High blood pressure     Review of Systems (Brittney Bell CMA; 02/23/2020 9:24 AM) General Present- Appetite Loss and Fatigue. Not Present- Chills, Fever, Night Sweats, Weight Gain and Weight Loss. Skin Not Present- Change in Wart/Mole, Dryness, Hives, Jaundice, New Lesions, Non-Healing Wounds, Rash and Ulcer. HEENT Present- Ringing in the Ears, Visual Disturbances and Wears glasses/contact lenses. Not Present- Earache, Hearing Loss, Hoarseness, Nose Bleed, Oral Ulcers, Seasonal Allergies, Sinus Pain, Sore Throat and Yellow Eyes. Respiratory Not Present- Bloody sputum, Chronic Cough, Difficulty Breathing, Snoring and Wheezing. Breast Present- Breast Pain and Skin Changes. Not Present- Breast Mass and Nipple Discharge. Cardiovascular Present- Leg Cramps, Palpitations, Rapid Heart Rate and Swelling of Extremities. Not Present- Chest Pain, Difficulty Breathing Lying Down and Shortness of Breath. Gastrointestinal Present- Nausea and Vomiting. Not Present- Abdominal Pain, Bloating, Bloody Stool, Change in Bowel Habits, Chronic diarrhea, Constipation, Difficulty Swallowing, Excessive gas, Gets full quickly at  meals, Hemorrhoids, Indigestion and Rectal Pain. Female Genitourinary Present- Frequency, Nocturia and Urgency. Not Present- Painful Urination and Pelvic Pain. Musculoskeletal Present- Back Pain. Not Present- Joint Pain, Joint Stiffness, Muscle Pain, Muscle Weakness and Swelling of Extremities. Neurological Present- Decreased Memory, Numbness, Tremor and Trouble walking. Not Present- Fainting, Headaches, Seizures, Tingling and Weakness. Endocrine Present- Cold Intolerance. Not Present- Excessive Hunger, Hair Changes, Heat Intolerance, Hot flashes and New Diabetes. Hematology Not Present- Blood Thinners, Easy Bruising, Excessive bleeding, Gland problems, HIV and Persistent Infections.  Vitals (Brittney Bell CMA; 02/23/2020 9:27 AM) 02/23/2020 9:27 AM Weight: 199.5 lb Height: 67in Body Surface Area: 2.02 m Body Mass Index: 31.25 kg/m  Temp.: 97.33F  Pulse: 97 (Regular)  BP: 126/84(Sitting, Left Arm, Standard)        Physical Exam Brittney Hazard K. Sidrah Harden MD; 02/23/2020 9:59 AM)  The physical exam findings are as follows: Note:Constitutional: WDWN in NAD, conversant, no obvious deformities; resting comfortably Eyes: Pupils equal, round; sclera anicteric; moist conjunctiva; no lid lag HENT: Oral mucosa moist; good dentition Neck: No masses palpated, trachea midline; no thyromegaly Lungs: CTA bilaterally; normal respiratory effort Breasts: bilateral fibrocystic changes; no dominant masses; no axillary lymphadenopathy Left nipple appears normal with no masses or drainage Right nipple - no obvious drainage; different in shape compared to the left. The right nipple appears almost dumbbell shaped with a firm upper portion and a smaller less firm lower portion. There almost seems to be a cleft between these two areas. CV: Regular rate and rhythm; no murmurs; extremities well-perfused with no edema Abd: +bowel sounds, soft, non-tender, no palpable organomegaly; no palpable hernias Musc:  Normal gait; no apparent clubbing or cyanosis in extremities Lymphatic: No palpable cervical or axillary lymphadenopathy Skin: Warm, dry; no sign of jaundice Psychiatric - alert and oriented x 4; calm mood and affect    Assessment & Plan Brittney Hazard K. Son Barkan MD; 02/23/2020 10:00 AM)  CYST OF RIGHT NIPPLE (N60.01)  Current Plans Schedule  for Surgery - Excision of right nipple cyst. The surgical procedure has been discussed with the patient. Potential risks, benefits, alternative treatments, and expected outcomes have been explained. All of the patient's questions at this time have been answered. The likelihood of reaching the patient's treatment goal is good. The patient understand the proposed surgical procedure and wishes to proceed. Note:We will excise the portion of the nipple with the cystic abnormality. This will leave her nipple smaller but will hopefully stop the drainage and will clearly establish the diagnosis.  Brittney Bell. Corliss Skains, MD, [facility] Surgery  General/ Trauma Surgery   02/23/2020 10:00 AM

## 2020-02-27 ENCOUNTER — Other Ambulatory Visit: Payer: Self-pay

## 2020-02-27 ENCOUNTER — Encounter (HOSPITAL_BASED_OUTPATIENT_CLINIC_OR_DEPARTMENT_OTHER): Payer: Self-pay | Admitting: Surgery

## 2020-03-01 ENCOUNTER — Other Ambulatory Visit (HOSPITAL_COMMUNITY)
Admission: RE | Admit: 2020-03-01 | Discharge: 2020-03-01 | Disposition: A | Discharge status: Home / Self Care | Payer: Managed Care, Other (non HMO) | Source: Ambulatory Visit | Attending: Surgery | Admitting: Surgery

## 2020-03-01 DIAGNOSIS — Z01812 Encounter for preprocedural laboratory examination: Secondary | ICD-10-CM | POA: Insufficient documentation

## 2020-03-01 DIAGNOSIS — Z20822 Contact with and (suspected) exposure to covid-19: Secondary | ICD-10-CM | POA: Insufficient documentation

## 2020-03-01 LAB — SARS CORONAVIRUS 2 (TAT 6-24 HRS): SARS Coronavirus 2: NEGATIVE

## 2020-03-04 NOTE — Progress Notes (Signed)

## 2020-03-05 ENCOUNTER — Ambulatory Visit (HOSPITAL_BASED_OUTPATIENT_CLINIC_OR_DEPARTMENT_OTHER)
Admission: RE | Admit: 2020-03-05 | Discharge: 2020-03-05 | Disposition: A | Discharge status: Home / Self Care | Payer: Managed Care, Other (non HMO) | Attending: Surgery | Admitting: Surgery

## 2020-03-05 ENCOUNTER — Encounter (HOSPITAL_BASED_OUTPATIENT_CLINIC_OR_DEPARTMENT_OTHER): Payer: Self-pay | Admitting: Surgery

## 2020-03-05 ENCOUNTER — Ambulatory Visit (HOSPITAL_BASED_OUTPATIENT_CLINIC_OR_DEPARTMENT_OTHER): Payer: Managed Care, Other (non HMO) | Admitting: Certified Registered Nurse Anesthetist

## 2020-03-05 ENCOUNTER — Encounter (HOSPITAL_BASED_OUTPATIENT_CLINIC_OR_DEPARTMENT_OTHER)
Admission: RE | Disposition: A | Discharge status: Home / Self Care | Payer: Self-pay | Source: Home / Self Care | Attending: Surgery

## 2020-03-05 DIAGNOSIS — Z79899 Other long term (current) drug therapy: Secondary | ICD-10-CM | POA: Insufficient documentation

## 2020-03-05 DIAGNOSIS — N6452 Nipple discharge: Secondary | ICD-10-CM | POA: Diagnosis present

## 2020-03-05 DIAGNOSIS — I10 Essential (primary) hypertension: Secondary | ICD-10-CM | POA: Diagnosis not present

## 2020-03-05 DIAGNOSIS — Z888 Allergy status to other drugs, medicaments and biological substances status: Secondary | ICD-10-CM | POA: Insufficient documentation

## 2020-03-05 DIAGNOSIS — Z881 Allergy status to other antibiotic agents status: Secondary | ICD-10-CM | POA: Diagnosis not present

## 2020-03-05 DIAGNOSIS — Z87891 Personal history of nicotine dependence: Secondary | ICD-10-CM | POA: Insufficient documentation

## 2020-03-05 DIAGNOSIS — N6001 Solitary cyst of right breast: Secondary | ICD-10-CM | POA: Insufficient documentation

## 2020-03-05 HISTORY — PX: BREAST CYST EXCISION: SHX579

## 2020-03-05 LAB — POCT PREGNANCY, URINE: Preg Test, Ur: NEGATIVE

## 2020-03-05 SURGERY — EXCISION, CYST, BREAST
Anesthesia: General | Site: Breast | Laterality: Right

## 2020-03-05 MED ORDER — MIDAZOLAM HCL 2 MG/2ML IJ SOLN
INTRAMUSCULAR | Status: AC
  Filled 2020-03-05: qty 2

## 2020-03-05 MED ORDER — BUPIVACAINE HCL (PF) 0.25 % IJ SOLN
INTRAMUSCULAR | Status: AC
  Filled 2020-03-05: qty 30

## 2020-03-05 MED ORDER — LACTATED RINGERS IV SOLN: INTRAVENOUS | Status: DC

## 2020-03-05 MED ORDER — GABAPENTIN 300 MG PO CAPS
ORAL_CAPSULE | ORAL | Status: AC
  Filled 2020-03-05: qty 1

## 2020-03-05 MED ORDER — OXYCODONE HCL 5 MG PO TABS: 5.0000 mg | ORAL_TABLET | Freq: Once | ORAL | Status: DC | PRN

## 2020-03-05 MED ORDER — DEXAMETHASONE SODIUM PHOSPHATE 4 MG/ML IJ SOLN
INTRAMUSCULAR | Status: DC | PRN
  Administered 2020-03-05: 10 mg via INTRAVENOUS

## 2020-03-05 MED ORDER — ACETAMINOPHEN 500 MG PO TABS
ORAL_TABLET | ORAL | Status: AC
  Filled 2020-03-05: qty 2

## 2020-03-05 MED ORDER — FENTANYL CITRATE (PF) 100 MCG/2ML IJ SOLN
INTRAMUSCULAR | Status: AC
  Filled 2020-03-05: qty 2

## 2020-03-05 MED ORDER — FENTANYL CITRATE (PF) 100 MCG/2ML IJ SOLN
25.0000 ug | INTRAMUSCULAR | Status: DC | PRN
  Administered 2020-03-05 (×2): 25 ug via INTRAVENOUS

## 2020-03-05 MED ORDER — PROPOFOL 10 MG/ML IV BOLUS
INTRAVENOUS | Status: DC | PRN
  Administered 2020-03-05: 180 mg via INTRAVENOUS

## 2020-03-05 MED ORDER — LIDOCAINE HCL (CARDIAC) PF 100 MG/5ML IV SOSY
PREFILLED_SYRINGE | INTRAVENOUS | Status: DC | PRN
  Administered 2020-03-05: 100 mg via INTRAVENOUS

## 2020-03-05 MED ORDER — AMISULPRIDE (ANTIEMETIC) 5 MG/2ML IV SOLN
10.0000 mg | Freq: Once | INTRAVENOUS | Status: AC
  Administered 2020-03-05: 10 mg via INTRAVENOUS

## 2020-03-05 MED ORDER — CEFAZOLIN SODIUM-DEXTROSE 2-4 GM/100ML-% IV SOLN
INTRAVENOUS | Status: AC
  Filled 2020-03-05: qty 100

## 2020-03-05 MED ORDER — HYDROCODONE-ACETAMINOPHEN 5-325 MG PO TABS: 1.0000 | ORAL_TABLET | Freq: Four times a day (QID) | ORAL | 0 refills | Status: DC | PRN

## 2020-03-05 MED ORDER — CHLORHEXIDINE GLUCONATE CLOTH 2 % EX PADS: 6.0000 | MEDICATED_PAD | Freq: Once | CUTANEOUS | Status: DC

## 2020-03-05 MED ORDER — ACETAMINOPHEN 500 MG PO TABS
1000.0000 mg | ORAL_TABLET | ORAL | Status: AC
  Administered 2020-03-05: 1000 mg via ORAL

## 2020-03-05 MED ORDER — ONDANSETRON HCL 4 MG/2ML IJ SOLN: 4.0000 mg | Freq: Four times a day (QID) | INTRAMUSCULAR | Status: DC | PRN

## 2020-03-05 MED ORDER — MIDAZOLAM HCL 5 MG/5ML IJ SOLN
INTRAMUSCULAR | Status: DC | PRN
  Administered 2020-03-05: 2 mg via INTRAVENOUS

## 2020-03-05 MED ORDER — FENTANYL CITRATE (PF) 100 MCG/2ML IJ SOLN
INTRAMUSCULAR | Status: DC | PRN
  Administered 2020-03-05: 50 ug via INTRAVENOUS

## 2020-03-05 MED ORDER — GABAPENTIN 300 MG PO CAPS
300.0000 mg | ORAL_CAPSULE | ORAL | Status: AC
  Administered 2020-03-05: 300 mg via ORAL

## 2020-03-05 MED ORDER — AMISULPRIDE (ANTIEMETIC) 5 MG/2ML IV SOLN
INTRAVENOUS | Status: AC
  Filled 2020-03-05: qty 4

## 2020-03-05 MED ORDER — ONDANSETRON HCL 4 MG/2ML IJ SOLN
INTRAMUSCULAR | Status: DC | PRN
  Administered 2020-03-05: 4 mg via INTRAVENOUS

## 2020-03-05 MED ORDER — PHENYLEPHRINE HCL (PRESSORS) 10 MG/ML IV SOLN
INTRAVENOUS | Status: DC | PRN
  Administered 2020-03-05: 120 ug via INTRAVENOUS

## 2020-03-05 MED ORDER — OXYCODONE HCL 5 MG/5ML PO SOLN: 5.0000 mg | Freq: Once | ORAL | Status: DC | PRN

## 2020-03-05 MED ORDER — CEFAZOLIN SODIUM-DEXTROSE 2-4 GM/100ML-% IV SOLN
2.0000 g | INTRAVENOUS | Status: AC
  Administered 2020-03-05: 2 g via INTRAVENOUS

## 2020-03-05 MED ORDER — BUPIVACAINE HCL (PF) 0.25 % IJ SOLN
INTRAMUSCULAR | Status: DC | PRN
  Administered 2020-03-05: 10 mL

## 2020-03-05 SURGICAL SUPPLY — 55 items
ADH SKN CLS APL DERMABOND .7 (GAUZE/BANDAGES/DRESSINGS) ×1
APL PRP STRL LF DISP 70% ISPRP (MISCELLANEOUS) ×1
APL SKNCLS STERI-STRIP NONHPOA (GAUZE/BANDAGES/DRESSINGS)
APL SWBSTK 6 STRL LF DISP (MISCELLANEOUS)
APPLICATOR COTTON TIP 6 STRL (MISCELLANEOUS) IMPLANT
APPLICATOR COTTON TIP 6IN STRL (MISCELLANEOUS)
APPLIER CLIP 9.375 MED OPEN (MISCELLANEOUS)
APR CLP MED 9.3 20 MLT OPN (MISCELLANEOUS)
BENZOIN TINCTURE PRP APPL 2/3 (GAUZE/BANDAGES/DRESSINGS) IMPLANT
BLADE HEX COATED 2.75 (ELECTRODE) ×2 IMPLANT
BLADE SURG 15 STRL LF DISP TIS (BLADE) ×1 IMPLANT
BLADE SURG 15 STRL SS (BLADE) ×2
CANISTER SUCT 1200ML W/VALVE (MISCELLANEOUS) ×2 IMPLANT
CHLORAPREP W/TINT 26 (MISCELLANEOUS) ×2 IMPLANT
CLIP APPLIE 9.375 MED OPEN (MISCELLANEOUS) IMPLANT
COVER BACK TABLE 60X90IN (DRAPES) ×2 IMPLANT
COVER MAYO STAND STRL (DRAPES) ×2 IMPLANT
COVER WAND RF STERILE (DRAPES) IMPLANT
DECANTER SPIKE VIAL GLASS SM (MISCELLANEOUS) IMPLANT
DERMABOND ADVANCED (GAUZE/BANDAGES/DRESSINGS) ×1
DERMABOND ADVANCED .7 DNX12 (GAUZE/BANDAGES/DRESSINGS) ×1 IMPLANT
DRAPE LAPAROTOMY 100X72 PEDS (DRAPES) ×2 IMPLANT
DRAPE UTILITY XL STRL (DRAPES) ×2 IMPLANT
DRSG TEGADERM 4X4.75 (GAUZE/BANDAGES/DRESSINGS) IMPLANT
ELECT REM PT RETURN 9FT ADLT (ELECTROSURGICAL) ×2
ELECTRODE REM PT RTRN 9FT ADLT (ELECTROSURGICAL) ×1 IMPLANT
GAUZE SPONGE 4X4 12PLY STRL LF (GAUZE/BANDAGES/DRESSINGS) IMPLANT
GLOVE BIO SURGEON STRL SZ7 (GLOVE) ×2 IMPLANT
GLOVE BIOGEL M 6.5 STRL (GLOVE) ×2 IMPLANT
GLOVE BIOGEL PI IND STRL 7.5 (GLOVE) ×1 IMPLANT
GLOVE BIOGEL PI INDICATOR 7.5 (GLOVE) ×1
GOWN STRL REUS W/ TWL LRG LVL3 (GOWN DISPOSABLE) ×2 IMPLANT
GOWN STRL REUS W/TWL LRG LVL3 (GOWN DISPOSABLE) ×4
ILLUMINATOR WAVEGUIDE N/F (MISCELLANEOUS) IMPLANT
KIT MARKER MARGIN INK (KITS) IMPLANT
LIGHT WAVEGUIDE WIDE FLAT (MISCELLANEOUS) IMPLANT
NEEDLE HYPO 25X1 1.5 SAFETY (NEEDLE) ×2 IMPLANT
NS IRRIG 1000ML POUR BTL (IV SOLUTION) IMPLANT
PACK BASIN DAY SURGERY FS (CUSTOM PROCEDURE TRAY) ×2 IMPLANT
PENCIL SMOKE EVACUATOR (MISCELLANEOUS) ×2 IMPLANT
SLEEVE SCD COMPRESS KNEE MED (MISCELLANEOUS) ×2 IMPLANT
SPONGE LAP 4X18 RFD (DISPOSABLE) ×2 IMPLANT
STRIP CLOSURE SKIN 1/2X4 (GAUZE/BANDAGES/DRESSINGS) IMPLANT
SUT CHROMIC 3 0 SH 27 (SUTURE) IMPLANT
SUT MON AB 4-0 PC3 18 (SUTURE) ×2 IMPLANT
SUT MON AB 5-0 P3 18 (SUTURE) ×2 IMPLANT
SUT SILK 2 0 SH (SUTURE) IMPLANT
SUT VIC AB 3-0 SH 27 (SUTURE) ×2
SUT VIC AB 3-0 SH 27X BRD (SUTURE) ×1 IMPLANT
SYR BULB EAR ULCER 3OZ GRN STR (SYRINGE) IMPLANT
SYR CONTROL 10ML LL (SYRINGE) ×2 IMPLANT
TOWEL GREEN STERILE FF (TOWEL DISPOSABLE) ×2 IMPLANT
TRAY FAXITRON CT DISP (TRAY / TRAY PROCEDURE) IMPLANT
TUBE CONNECTING 20X1/4 (TUBING) IMPLANT
YANKAUER SUCT BULB TIP NO VENT (SUCTIONS) IMPLANT

## 2020-03-05 NOTE — Anesthesia Procedure Notes (Signed)
Procedure Name: LMA Insertion Performed by: Gia Lusher, Ramona M, CRNA Pre-anesthesia Checklist: Patient identified, Emergency Drugs available, Suction available and Patient being monitored Patient Re-evaluated:Patient Re-evaluated prior to induction Oxygen Delivery Method: Circle system utilized Preoxygenation: Pre-oxygenation with 100% oxygen Induction Type: IV induction Ventilation: Mask ventilation without difficulty LMA: LMA inserted LMA Size: 4.0 Tube type: Oral Number of attempts: 1 Airway Equipment and Method: Bite block Placement Confirmation: positive ETCO2 Tube secured with: Tape Dental Injury: Teeth and Oropharynx as per pre-operative assessment        

## 2020-03-05 NOTE — Transfer of Care (Signed)
Immediate Anesthesia Transfer of Care Note  Patient: Brittney Bell  Procedure(s) Performed: Excision of right nipple cyst (Right Breast)  Patient Location: PACU  Anesthesia Type:General  Level of Consciousness: awake, alert  and oriented  Airway & Oxygen Therapy: Patient Spontanous Breathing and Patient connected to face mask oxygen  Post-op Assessment: Report given to RN and Post -op Vital signs reviewed and stable  Post vital signs: Reviewed and stable  Last Vitals:  Vitals Value Taken Time  BP    Temp    Pulse    Resp    SpO2      Last Pain:  Vitals:   03/05/20 1340  TempSrc: Oral  PainSc: 2          Complications: No complications documented.

## 2020-03-05 NOTE — Interval H&P Note (Signed)
History and Physical Interval Note:  03/05/2020 2:30 PM  Brittney Bell  has presented today for surgery, with the diagnosis of right nipple cyst with drainage.  The various methods of treatment have been discussed with the patient and family. After consideration of risks, benefits and other options for treatment, the patient has consented to  Procedure(s) with comments: excision of right nipple cyst (Right) - lma as a surgical intervention.  The patient's history has been reviewed, patient examined, no change in status, stable for surgery.  I have reviewed the patient's chart and labs.  Questions were answered to the patient's satisfaction.     Wynona Luna

## 2020-03-05 NOTE — Anesthesia Preprocedure Evaluation (Signed)
Anesthesia Evaluation  Patient identified by MRN, date of birth, ID band Patient awake    Reviewed: Allergy & Precautions, H&P , NPO status , Patient's Chart, lab work & pertinent test results  Airway Mallampati: II   Neck ROM: full    Dental   Pulmonary former smoker,    breath sounds clear to auscultation       Cardiovascular hypertension,  Rhythm:regular Rate:Normal     Neuro/Psych PSYCHIATRIC DISORDERS Anxiety    GI/Hepatic   Endo/Other    Renal/GU      Musculoskeletal   Abdominal   Peds  Hematology   Anesthesia Other Findings   Reproductive/Obstetrics                             Anesthesia Physical Anesthesia Plan  ASA: II  Anesthesia Plan: General   Post-op Pain Management:    Induction: Intravenous  PONV Risk Score and Plan: 3 and Ondansetron, Dexamethasone, Midazolam and Treatment may vary due to age or medical condition  Airway Management Planned: LMA  Additional Equipment:   Intra-op Plan:   Post-operative Plan: Extubation in OR  Informed Consent: I have reviewed the patients History and Physical, chart, labs and discussed the procedure including the risks, benefits and alternatives for the proposed anesthesia with the patient or authorized representative who has indicated his/her understanding and acceptance.       Plan Discussed with: CRNA, Anesthesiologist and Surgeon  Anesthesia Plan Comments:         Anesthesia Quick Evaluation

## 2020-03-05 NOTE — Op Note (Signed)
Preop diagnosis: Right nipple discharge, probable nipple cyst Postop diagnosis: Same Procedure performed: Excision of right nipple cyst Surgeon:Cassie Henkels K Garret Teale Anesthesia: General Indications: This is a 49 year old female who presents with persistent drainage from her nipple.  There has been a change in the shape of her nipple and now it almost appears that there is a figure-of-eight shape with some firmness in the upper portion.  Ultrasound showed a small cystic papule of the right upper nipple.  Work-up was otherwise unremarkable.  After discussion with the patient and due to the persistent drainage, she elects to have this area excised for diagnosis.  Description of procedure: The patient is brought to the operating room and placed in the supine position on the operating room table.  After an adequate level of general anesthesia was obtained the patient's right chest was prepped with ChloraPrep and draped in sterile fashion.  A timeout was taken to ensure the proper patient and proper procedure.  The area around the right nipple was infiltrated with local anesthetic.  On careful examination and palpation of the nipple, there is some whitish drainage coming from the upper portion of the dumbbell shaped nipple.  There is almost a waist or a cleft in the center of the nipple.  We made our incision across this area and dissected through the dermis.  We excised the upper portion of the nipple.  Cautery was used for hemostasis.  We then closed the wound with a 5-0 Monocryl suture.  Dermabond was applied.  The specimen was sent for pathologic examination.  The patient was then extubated and brought to the recovery room in stable condition.  All sponge, instrument, and needle counts are correct.  Wilmon Arms. Corliss Skains, MD, [facility] Surgery  General/ Trauma Surgery   03/05/2020 3:24 PM

## 2020-03-05 NOTE — Discharge Instructions (Signed)
[facility] Office Phone Number [phone]   POST OP INSTRUCTIONS  Always review your discharge instruction sheet given to you by the facility where your surgery was performed.  IF YOU HAVE DISABILITY OR FAMILY LEAVE FORMS, YOU MUST BRING THEM TO THE OFFICE FOR PROCESSING.  DO NOT GIVE THEM TO YOUR DOCTOR.  1. A prescription for pain medication may be given to you upon discharge.  Take your pain medication as prescribed, if needed.  If narcotic pain medicine is not needed, then you may take acetaminophen (Tylenol) or ibuprofen (Advil) as needed. 2. Take your usually prescribed medications unless otherwise directed 3. If you need a refill on your pain medication, please contact your pharmacy.  They will contact our office to request authorization.  Prescriptions will not be filled after 5pm or on week-ends. 4. You should eat very light the first 24 hours after surgery, such as soup, crackers, pudding, etc.  Resume your normal diet the day after surgery. 5. Most patients will experience some swelling and bruising in the breast.  Ice packs and a good support bra will help.  Swelling and bruising can take several days to resolve.  6. It is common to experience some constipation if taking pain medication after surgery.  Increasing fluid intake and taking a stool softener will usually help or prevent this problem from occurring.  A mild laxative (Milk of Magnesia or Miralax) should be taken according to package directions if there are no bowel movements after 48 hours. 7. If your surgeon used skin glue on the incision, you may shower in 24 hours.  The glue will flake off over the next 2-3 weeks.   8. ACTIVITIES:  You may resume regular daily activities (gradually increasing) beginning the next day.  Wearing a good support bra or sports bra minimizes pain and swelling.  You may have sexual intercourse when it is comfortable. a. You may drive when you no longer are taking prescription pain  medication, you can comfortably wear a seatbelt, and you can safely maneuver your car and apply brakes. b. RETURN TO WORK:  ______________________________________________________________________________________ 9. You should see your doctor in the office for a follow-up appointment approximately two weeks after your surgery.  Your doctor's nurse will typically make your follow-up appointment when she calls you with your pathology report.  Expect your pathology report 2-3 business days after your surgery.  You may call to check if you do not hear from [place] after three days. 10. OTHER INSTRUCTIONS: _______________________________________________________________________________________________ _____________________________________________________________________________________________________________________________________ _____________________________________________________________________________________________________________________________________ _____________________________________________________________________________________________________________________________________  WHEN TO CALL YOUR DOCTOR: 1. Fever over 101.0 2. Nausea and/or vomiting. 3. Extreme swelling or bruising. 4. Continued bleeding from incision. 5. Increased pain, redness, or drainage from the incision.  The clinic staff is available to answer your questions during regular business hours.  Please don't hesitate to call and ask to speak to one of the nurses for clinical concerns.  If you have a medical emergency, go to the nearest emergency room or call 911.  A surgeon from [facility] is always on call at the hospital.  For further questions, please visit [email or web address]    May take Tylenol at 730pm today as needed  Post Anesthesia Home Care Instructions  Activity: Get plenty of rest for the remainder of the day. A responsible individual must stay with you for 24 hours following the  procedure.  For the next 24 hours, DO NOT: -Drive a car -Advertising copywriter -Drink alcoholic beverages -Take any medication unless instructed by your  physician -Make any legal decisions or sign important papers.  Meals: Start with liquid foods such as gelatin or soup. Progress to regular foods as tolerated. Avoid greasy, spicy, heavy foods. If nausea and/or vomiting occur, drink only clear liquids until the nausea and/or vomiting subsides. Call your physician if vomiting continues.  Special Instructions/Symptoms: Your throat may feel dry or sore from the anesthesia or the breathing tube placed in your throat during surgery. If this causes discomfort, gargle with warm salt water. The discomfort should disappear within 24 hours.  If you had a scopolamine patch placed behind your ear for the management of post- operative nausea and/or vomiting:  1. The medication in the patch is effective for 72 hours, after which it should be removed.  Wrap patch in a tissue and discard in the trash. Wash hands thoroughly with soap and water. 2. You may remove the patch earlier than 72 hours if you experience unpleasant side effects which may include dry mouth, dizziness or visual disturbances. 3. Avoid touching the patch. Wash your hands with soap and water after contact with the patch.

## 2020-03-07 ENCOUNTER — Encounter (HOSPITAL_BASED_OUTPATIENT_CLINIC_OR_DEPARTMENT_OTHER): Payer: Self-pay | Admitting: Surgery

## 2020-03-07 LAB — SURGICAL PATHOLOGY

## 2020-03-12 NOTE — Anesthesia Postprocedure Evaluation (Addendum)
Anesthesia Post Note  Patient: Brittney Bell  Procedure(s) Performed: Excision of right nipple cyst (Right Breast)     Patient location during evaluation: PACU Anesthesia Type: General Level of consciousness: awake and alert Pain management: pain level controlled Vital Signs Assessment: post-procedure vital signs reviewed and stable Respiratory status: spontaneous breathing, nonlabored ventilation, respiratory function stable and patient connected to nasal cannula oxygen Cardiovascular status: blood pressure returned to baseline and stable Postop Assessment: no apparent nausea or vomiting Anesthetic complications: no   No complications documented.  Last Vitals:  Vitals:   03/05/20 1610 03/05/20 1630  BP:  (!) 143/70  Pulse: (!) 58 75  Resp: 12 16  Temp:  36.8 C  SpO2: 99% 100%    Last Pain:  Vitals:   03/06/20 1004  TempSrc:   PainSc: 4                  Aaiden Depoy S

## 2020-03-21 ENCOUNTER — Ambulatory Visit: Payer: Managed Care, Other (non HMO) | Admitting: Psychology

## 2020-03-21 ENCOUNTER — Encounter: Payer: Self-pay | Admitting: Psychology

## 2020-03-21 ENCOUNTER — Ambulatory Visit (INDEPENDENT_AMBULATORY_CARE_PROVIDER_SITE_OTHER): Payer: Managed Care, Other (non HMO) | Admitting: Psychology

## 2020-03-21 ENCOUNTER — Other Ambulatory Visit: Payer: Self-pay

## 2020-03-21 DIAGNOSIS — F329 Major depressive disorder, single episode, unspecified: Secondary | ICD-10-CM | POA: Insufficient documentation

## 2020-03-21 DIAGNOSIS — F411 Generalized anxiety disorder: Secondary | ICD-10-CM | POA: Diagnosis not present

## 2020-03-21 DIAGNOSIS — R4189 Other symptoms and signs involving cognitive functions and awareness: Secondary | ICD-10-CM

## 2020-03-21 DIAGNOSIS — M545 Low back pain, unspecified: Secondary | ICD-10-CM | POA: Insufficient documentation

## 2020-03-21 DIAGNOSIS — F330 Major depressive disorder, recurrent, mild: Secondary | ICD-10-CM | POA: Diagnosis not present

## 2020-03-21 NOTE — Progress Notes (Addendum)
NEUROPSYCHOLOGICAL EVALUATION [place] Department [facility]rology  Date of Evaluation: March 21, 2020  Reason for Referral:   Brittney Bell is a 49 y.o. right-handed Caucasian female referred by Brittney MilletAdam Bell, D.O., to characterize her current cognitive functioning and assist with diagnostic clarity and treatment planning in the context of subjective cognitive decline and potential psychiatric comorbidities.   Assessment and Plan:   Clinical Impression(s): Scores across performance validity measures were variable but largely within expectation. However, given this variability, lower performances should be interpreted with a mild degree of caution and have the potential to underestimate true cognitive abilities.   Overall, when taken at face value, Brittney Bell' pattern of performance is suggestive of normative impairments in semantic fluency, bilateral fine motor coordination/speed, and across a computerized executive functioning task assessing pattern recognition and adaptability. Performance variability was further exhibited across encoding (i.e., learning) and retrieval aspects of memory. Performances were appropriate across processing speed, attention/concentration, other aspects of executive functioning, receptive language, phonemic fluency, confrontation naming, and visuospatial abilities. Brittney Bell denied difficulties completing instrumental activities of daily living (ADLs) independently.   Across mood-related questionnaires, she reported acute symptoms of mild depression and moderate anxiety. She further reported moderate symptoms of sleep dysfunction. Ongoing mood and sleep dysfunction can certainly impact cognitive performances across testing, especially across executive functioning and learning and memory. Given that behavioral observations were noteworthy for Brittney Bell experiencing significant anxiety during testing to the extent where she nearly  became tearful on several occasions, it is also possible that acute symptoms directly impacted her ability to focus on testing material. Overall, it appears most likely that current day-to-day cognitive deficits are related to ongoing sleep dysfunction and psychiatric distress. Based upon current testing, I cannot rule out the presence of an underlying cognitive disorder. She may be experiencing very mild vascular contributions given neuroimaging suggesting mild small vessel ischemic changes. However, Brittney Bell is quite young relative to when neurodegenerative conditions normally present and her pattern of performance across testing is not consistent with Alzheimer's disease, Parkinson's disease, Lewy body dementia, or frontotemporal dementia at the present time. Continued medical monitoring will be important moving forward.   Recommendations: Brittney Bell reported that she is unable to see her prior PCP due to this physician being unvaccinated against COVID-19. I would strongly encourage her to contact the office of her PCP and seek to establish care with a different provider within that clinic. This will be very important in managing recommendations outlined below.   A combination of medication and psychotherapy has been shown to be most effective at treating symptoms of anxiety and depression. As such, Brittney Bell is encouraged to speak with her prescribing physician regarding the addition of mood-related medications to optimally manage these symptoms.   Likewise, Brittney Bell is encouraged to engage in short-term psychotherapy to address symptoms of psychiatric distress. Based on her description, it seems likely that her previous therapy experience was with a psychiatrist focused on medication management and not with a psychologist of licensed mental health counselor. She would benefit from an active and collaborative therapeutic environment, rather than one purely supportive in nature. Recommended treatment  modalities include Cognitive Behavioral Therapy (CBT) or Acceptance and Commitment Therapy (ACT).  Brittney Bell reported only obtaining 3 hours of sleep per night on average and described difficulties with both falling asleep and staying asleep throughout the night. She is strongly encouraged to discuss medication options with her PCP to help her in obtaining an  adequate amount and quality of sleep on a nightly basis. A laboratory sleep study could also be considered.   Should mood and sleep-related dysfunction become better managed and Brittney Bell continue to report subjective cognitive decline in her day-to-day life, a referral for a repeat neuropsychological evaluation would be warranted at that time. The current evaluation will serve as an excellent baseline to draw future comparisons against.   Brittney Bell is encouraged to attend to lifestyle factors for brain health (e.g., regular physical exercise, good nutrition habits, regular participation in cognitively-stimulating activities, and general stress management techniques), which are likely to have benefits for both emotional adjustment and cognition. In fact, in addition to promoting good general health, regular exercise incorporating aerobic activities (e.g., brisk walking, jogging, cycling, etc.) has been demonstrated to be a very effective treatment for depression and stress, with similar efficacy rates to both antidepressant medication and psychotherapy. Optimal control of vascular risk factors (including safe cardiovascular exercise and adherence to dietary recommendations) is encouraged.   If interested, there are some activities which have therapeutic value and can be useful in keeping her cognitively stimulated. For suggestions, Brittney Bell is encouraged to go to the following website: https://www.[facility].org/get-to-know-barrow/centers-programs/neurorehabilitation-center/neuro-rehab-apps-and-games/ which has options, categorized by level of  difficulty. It should be noted that these activities should not be viewed as a substitute for therapy.  When learning new information, she would benefit from information being broken up into small, manageable pieces. She may also find it helpful to articulate the material in her own words and in a context to promote encoding at the onset of a new task. This material may need to be repeated multiple times to promote encoding.  Memory can be improved using internal strategies such as rehearsal, repetition, chunking, mnemonics, association, and imagery. External strategies such as written notes in a consistently used memory journal, visual and nonverbal auditory cues such as a calendar on the refrigerator or appointments with alarm, such as on a cell phone, can also help maximize recall.    To address problems with executive dysfunction, she may wish to consider:   -Avoiding external distractions when needing to concentrate   -Limiting exposure to fast paced environments with multiple sensory demands   -Writing down complicated information and using checklists   -Attempting and completing one task at a time (i.e., no multi-tasking)   -Verbalizing aloud each step of a task to maintain focus   -Reducing the amount of information considered at one time  Review of Records:   Brittney Bell was seen by [facility] Brittney Bell, D.O.) on 02/07/2020 for an evaluation of memory loss and vision changes. Briefly, Brittney Bell reported short-term memory dysfunction ongoing since October 2020. She described instances where she has trouble remembering the name of her work or will forget the PIN to an alarm. She additionally described instances where she will miss her driveway while driving, as well as an instance at [employer] where she could not remember or process why she was there. She further reported trouble recalling the names of her children at times. Despite this, she is able to recall phone numbers and did not  report any work-related performance concerns. In addition to memory concerns, she also reported trouble prioritizing tasks and word-finding difficulties. Performance on a brief cognitive screening instrument (SLUMS) was 21/30. Ultimately, Brittney Bell was referred for a comprehensive neuropsychological evaluation to characterize her cognitive abilities and to assist with diagnostic clarity and treatment planning.   In addition to cognitive concerns, Brittney Bell reported  symptoms of tinnitus. She also feels tremulous and off-balance at times, generally leaning towards the left. If she gets up from her bed too quickly, she may get dizzy and fall. She also reported feeling more irritable, may get upset easily, and has "no filter" and says what is on her mind. Finally, she reported peripheral vision loss in her left eye. She had an eye exam at [facility] in April 2021 which confirmed this experience. She did follow up with ophthalmology. This examination showed peripheral vision loss but was otherwise unremarkable.  Brain MRI on 01/12/2020 revealed mild chronic small vessel ischemic disease but no abnormal patterns of cerebral atrophy. Nothing was found to explain left-sided peripheral vision loss.  Past Medical History:  Diagnosis Date  . Cellulitis and abscess of face 01/29/2013  . Essential hypertension, benign 05/15/2017  . Generalized anxiety disorder 05/20/2017  . Low back pain   . Major depressive disorder     Past Surgical History:  Procedure Laterality Date  . BREAST CYST EXCISION Right 03/05/2020   Procedure: Excision of right nipple cyst;  Surgeon: Manus Rudd, MD;  Location: [facility];  Service: General;  Laterality: Right;  lma  . TUBAL LIGATION      Current Outpatient Medications:  .  acetaminophen (TYLENOL) 500 MG tablet, Take 1,000-1,500 mg by mouth every 8 (eight) hours as needed for mild pain or headache., Disp: , Rfl:  .  diltiazem (CARDIZEM CD) 120 MG 24 hr  capsule, Take 1 capsule (120 mg total) by mouth daily., Disp: 90 capsule, Rfl: 1 .  HYDROcodone-acetaminophen (NORCO/VICODIN) 5-325 MG tablet, Take 1 tablet by mouth every 6 (six) hours as needed for moderate pain., Disp: 15 tablet, Rfl: 0 .  ibuprofen (ADVIL) 200 MG tablet, Take 600-800 mg by mouth every 6 (six) hours as needed for headache or moderate pain., Disp: , Rfl:   Clinical Interview:   The following information was obtained during a clinical interview with Brittney Bell prior to cognitive testing.  Cognitive Symptoms: Decreased short-term memory: Endorsed. She described several examples of short-term memory dysfunction including trouble recalling passwords and PINs, trouble remembering driving directions or missing her driveway, trouble recalling the details of previous conversations, and misplacing objects around her home/work. Despite these difficulties, she denied trouble recalling phone numbers or remembering customer appointment information while at work. Deficits were said to be present since October 2020 and have seemed largely stable over that time.   Decreased long-term memory: Denied. Decreased attention/concentration: Endorsed. While she did not endorse deficits with sustained attention, she did acknowledge noted deficits surrounding increased distractibility. She additionally described herself as "scatter-brained" on several occasions and that lately, she has been "flying by the seat of [her] pants." She denied attention/distractibility concerns while younger, stating that these symptoms have emerged during the past year or so.  Reduced processing speed: Endorsed. She described ongoing symptoms of brain fog.  Difficulties with executive functions: Endorsed. 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 She noted that she is often overwhelmed when first presented with a lot of information, but can generally work  through this with time. She denied difficulties with impulsivity.  Difficulties with emotion regulation: Largely denied. However, she did state that she has had a "shorter fuse" lately and is far more easily irritated or agitated by others. She further noted having "no filter" and more readily says what is on her mind. This was said to represent a change during the past few  months.  Difficulties with receptive language: Denied. Difficulties with word finding: Endorsed. Decreased visuoperceptual ability: Endorsed. Deficits were attributed to her history of peripheral vision loss in her left eye as she noted bumping into things while walking and sometimes ambulating "like I'm drunk."   Difficulties completing ADLs: Denied.   Additional Medical History: History of traumatic brain injury/concussion: Denied. History of stroke: Denied. History of seizure activity: Denied. History of known exposure to toxins: Denied. Symptoms of chronic pain: Endorsed. Specifically, she described pain symptoms in her lower back and right hip.  Experience of frequent headaches/migraines: Denied. She reported severe headache symptoms where she must isolate herself in a dark, quiet environment occurring approximately once per month.  Frequent instances of dizziness/vertigo: Denied. However, she did acknowledge occasional experiences of dizziness when standing quickly or getting out of bed. She also described experiencing a "warm fuzzy feeling" when she has episodes of confusion.   Sensory changes: As described above, she has noted peripheral vision loss on the left side. She also noted that her vision has dramatically changed for the worse during her last few eye doctor appointments. The origin of her peripheral vision loss remains unknown. Additionally, she reported significant and near constant symptoms of tinnitus. Other sensory changes/difficulties (e.g., taste or smell) were denied.  Balance/coordination difficulties:  She described her balance as "most of the time good." She did report a history of a few falls. These generally occurred following dizziness while attempting to get out of bed. She also reported occasional weakness in her lower extremities which can contribute to balance instability.  Other motor difficulties: Endorsed. She reported tremulous symptoms in her upper extremities. One side was not said to be worse than the other. It was unclear if these were best attributed to anxiety symptoms.   Sleep History: Estimated hours obtained each night: 3 hours. She described her sleep as "not good."  Difficulties falling asleep: Endorsed. Difficulties staying asleep: Endorsed. She described difficulties not only falling asleep, but generally obtaining very broken sleep throughout the night. She reported that she is a light sleeper, will often wake throughout the night, and will then proceed to check on the safety of her children and other aspects of the house.  Feels rested and refreshed upon awakening: Denied.  History of snoring: Denied. History of waking up gasping for air: Denied. Witnessed breath cessation while asleep: Denied.  History of vivid dreaming: Denied. However, she did report instances where she will wake up feeling as though her heart may burst out of her chest.  Excessive movement while asleep: Denied. Instances of acting out her dreams: Denied.  Psychiatric/Behavioral Health History: Depression: She described her current mood as "mean," noting that's she has been very short with individuals lately and is far more easily irritated. She reported a few remote instances of depression following the passing of her first husband, as well as an unspecified event/period of time several years prior. During those instances, she was prescribed mood-related medications. However, these were discontinued over time in each instance. She also reported working with what appeared to be a psychiatrist who  attempted to engage her in talk therapy in the past. However, this was described as ineffective as "all they wanted to talk about was medications." Current or remote suicidal ideation, intent, or plan was denied.  Anxiety: Brittney Bell stated that she did not think that she was currently experiencing anxiety symptoms. However, during the interview, she did describe experiencing panic like symptoms at times (especially during episodes of  confusion), as well as symptoms which may be impacting her sleep.  Mania: Denied. Trauma History: Denied. Visual/auditory hallucinations: Denied. Delusional thoughts: Denied.  Tobacco: Denied. She reportedly quit approximately 4.5 years ago.  Alcohol: She reported consuming alcohol socially and denied a history of problematic alcohol abuse or dependence.  Recreational drugs: Denied. Caffeine: Two cups of coffee in the morning.   Family History: Problem Relation Age of Onset  . Heart disease Mother   . Hypertension Mother   . Bipolar disorder Brother   . ADD / ADHD Daughter   . Dementia Other        Grandmother's sister   This information was confirmed by Brittney Bell.  Academic/Vocational History: Highest level of educational attainment: 13 years. She graduated from high school and completed one additional year of college. She described herself as a good (mostly A) student in academic settings. No relative weaknesses were identified.  History of developmental delay: Denied. History of grade repetition: Denied. Enrollment in special education courses: Denied. History of LD/ADHD: Denied.  Employment: She currently works as an Architectural technologist at [employer] She noted that she has not had any negative performance reviews and managers/supervisors have not commented on memory loss or other cognitive deficits.   Evaluation Results:   Behavioral Observations: Ms. Valtierra was unaccompanied, arrived to her appointment on time, and was  appropriately dressed and groomed. She appeared alert and oriented. Observed gait and station were within normal limits. Gross motor functioning appeared intact upon informal observation. Very slight tremulous activity was noted bilaterally; however, this was only witnessed after Ms. Castellanos held up her hands to demonstrate this. Her affect was generally relaxed and positive, but did range appropriately given the subject being discussed during the clinical interview. Spontaneous speech was fluent and word finding difficulties were not observed during the interview. Thought processes were coherent, organized, and normal in content. Insight into her cognitive difficulties appeared adequate. During testing, she appeared visibly anxious and several times appeared on the verge of tears while completing tasks. This was especially prevalent in earlier portions of the evaluation. Sustained attention was appropriate. Task engagement was adequate and she persisted when challenged. Overall, Ms. Forry was cooperative with the clinical interview and subsequent testing procedures.   Adequacy of Effort: The validity of neuropsychological testing is limited by the extent to which the individual being tested may be assumed to have exerted adequate effort during testing. Brittney Bell expressed her intention to perform to the best of her abilities and exhibited adequate task engagement and persistence. Scores across stand-alone and embedded performance validity measures were variable but largely within expectation. However, given this variability, lower performances should be interpreted with a mild degree of caution and could underestimate true cognitive abilities.   Test Results: Ms. Kwan was fully oriented at the time of the current evaluation.  Intellectual abilities based upon educational and vocational attainment were estimated to be in the average range. Premorbid abilities were estimated to be within the above average  range based upon a single-word reading test.   Processing speed was below average to average. Basic attention was well above average. More complex attention (e.g., working memory) was above average. Executive functioning was largely average to above average. However, she did exhibit a weakness across a computerized task assessing pattern recognition and adaptability.  Assessed receptive language abilities were above average. Likewise, Ms. Klarich did not exhibit any difficulties comprehending task instructions and answered all questions asked of her appropriately. Assessed expressive language  was somewhat variable. Phonemic fluency was below average to average, semantic fluency was exceptionally low to well below average, and confrontation naming was average.      Assessed visuospatial/visuoconstructional abilities were below average to average. Points were lost on her drawing of a clock due to no hand size differentiation.    Learning (i.e., encoding) of novel verbal and visual information was variable, ranging from the well below average to average normative ranges. Spontaneous delayed recall (i.e., retrieval) of previously learned information was also variable, ranging from the exceptionally low to above average ranges. Retention rates were 59% across a story learning task, 100% across a list learning task, and 120% across a shape learning task. Performance across recognition tasks was exceptionally low to below average, suggesting some evidence for information consolidation.  Fine motor coordination and speed was exceptionally low when using her dominant (right) hand and well below average when using her non-dominant hand.    Results of emotional screening instruments suggested that recent symptoms of generalized anxiety were in the moderate range, while symptoms of depression were within the mild range. A screening instrument assessing recent sleep quality suggested the presence of moderate sleep  dysfunction.  Tables of Scores:   Note: This summary of test scores accompanies the interpretive report and should not be considered in isolation without reference to the appropriate sections in the text. Descriptors are based on appropriate normative data and may be adjusted based on clinical judgment. The terms "impaired" and "within normal limits (WNL)" are used when a more specific level of functioning cannot be determined.       Effort Testing:   DESCRIPTOR       ACS Word Choice: --- --- Below Expectation  Dot Counting Test: --- --- Within Expectation  NAB EVI: --- --- Within Expectation  D-KEFS Color Word Effort Index: --- --- Within Expectation       Orientation:      Raw Score Percentile   NAB Orientation, Form 1 29/29 --- ---       Cognitive Screening:           Raw Score Percentile   SLUMS: 22/30 --- ---       Intellectual Functioning:           Standard Score Percentile   Test of Premorbid Functioning: 114 82 Above Average       Memory:          NAB Memory Module, Form 2: T Score Percentile   List Learning       Total Trials 1-3 19/36 (37) 9 Below Average    List B 5/12 (52) 58 Average    Short Delay Free Recall 4/12 (29) 2 Exceptionally Low    Long Delay Free Recall 4/12 (32) 4 Well Below Average    Retention Percentage 100 (49) 46 Average    Recognition Discriminability 4 (33) 5 Well Below Average  Shape Learning       Total Trials 1-3 13/27 (39) 14 Below Average    Delayed Recall 6/9 (51) 54 Average    Retention Percentage 120 (57) 75 Above Average    Recognition Discriminability 6 (40) 16 Below Average  Story Learning       Immediate Recall 29/80 (31) 3 Well Below Average    Delayed Recall 10/40 (29) 2 Exceptionally Low    Retention Percentage 59 (26) 1 Exceptionally Low  Daily Living Memory       Immediate Recall 46/51 (54) 66 Average    Delayed  Recall 12/17 (36) 8 Well Below Average    Retention Percentage 71 (35) 7 Well Below Average    Recognition  Hits 7/10 (35) 7 Well Below Average       Attention/Executive Function:          Trail Making Test (TMT): Raw Score (T Score) Percentile     Part A 30 secs.,  0 errors (46) 34 Average    Part B 46 secs.,  0 errors (60) 84 Above Average         Scaled Score Percentile   WAIS-IV Coding: 6 9 Below Average       NAB Attention Module, Form 1: T Score Percentile     Digits Forward 64 92 Well Above Average    Digits Backwards 57 75 Above Average       D-KEFS Color-Word Interference Test: Raw Score (Scaled Score) Percentile     Color Naming 29 secs. (10) 50 Average    Word Reading 22 secs. (11) 63 Average    Inhibition 59 secs. (10) 50 Average      Total Errors 2 errors (9) 37 Average    Inhibition/Switching 65 secs. (11) 63 Average      Total Errors 3 errors (9) 37 Average       D-KEFS Verbal Fluency Test: Raw Score (Scaled Score) Percentile     Letter Total Correct 29 (8) 25 Average    Category Total Correct 24 (4) 2 Well Below Average    Category Switching Total Correct 12 (9) 37 Average    Category Switching Accuracy 11 (9) 37 Average      Total Set Loss Errors 0 (13) 84 Above Average      Total Repetition Errors 0 (13) 84 Above Average       D-KEFS 20 Questions Test: Scaled Score Percentile     Total Weighted Achievement Score 13 84 Above Average    Initial Abstraction Score 6 9 Below Average       [place] Card Sorting Test: Raw Score Percentile     Categories (trials) 2 (64) 11-16 Below Average    Total Errors 31 5 Well Below Average    Perseverative Errors 16 9 Below Average    Non-Perseverative Errors 15 5 Well Below Average    Failure to Maintain Set 0 --- ---       Language:          Verbal Fluency Test: Raw Score (T Score) Percentile     Phonemic Fluency (FAS) 29 (39) 14 Below Average    Animal Fluency 10 (21) <1 Exceptionally Low        NAB Language Module, Form 1: T Score Percentile     Auditory Comprehension 57 75 Above Average    Naming 31/31 (55) 69  Average       Visuospatial/Visuoconstruction:      Raw Score Percentile   Clock Drawing: 8/10 --- Within Normal Limits       NAB Spatial Module, Form 1: T Score Percentile     Figure Drawing Copy 53 62 Average    Figure Drawing Immediate Recall 44 27 Average        Scaled Score Percentile   WAIS-IV Block Design: 6 9 Below Average  WAIS-IV Matrix Reasoning: 11 63 Average       Sensory-Motor:          [place] Grooved Pegboard Test: Raw Score Percentile     Dominant Hand 118 secs.,  0 drops  1 Exceptionally  Low    Non-Dominant Hand 104 secs.,  1 drop 3 Well Below Average       Mood and Personality:      Raw Score Percentile   Beck Depression Inventory - II: 18 --- Mild  PROMIS Anxiety Questionnaire: 25 --- Moderate       Additional Questionnaires:      Raw Score Percentile   Adult Self-Report Scale (Childhood):       Inattention 0 --- Unlikely to have ADHD    Hyperactive/Impulsive 9 --- Unlikely to have ADHD       PROMIS Sleep Disturbance Questionnaire: 36 --- Moderate  Fatigue Severity Scale: 22 --- Within Normal Limits   Informed Consent and Coding/Compliance:   Ms. Wierman was provided with a verbal description of the nature and purpose of the present neuropsychological evaluation. Also reviewed were the foreseeable risks and/or discomforts and benefits of the procedure, limits of confidentiality, and mandatory reporting requirements of this provider. The patient was given the opportunity to ask questions and receive answers about the evaluation. Oral consent to participate was provided by the patient.   This evaluation was conducted by Newman Nickels, Ph.D., licensed clinical neuropsychologist. Ms. Soza completed a comprehensive clinical interview with Dr. [name], billed as one unit [phone], and 165 minutes of cognitive testing and scoring, billed as one unit [phone] and five additional units [phone]. Psychometrist Wallace Keller, B.S., assisted Dr. [name] with test  administration and scoring procedures. As a separate and discrete service, Dr. [name] spent a total of 120 minutes in interpretation and report writing billed as one unit [phone] and one unit [phone].

## 2020-03-21 NOTE — Progress Notes (Signed)
   Psychometrician Note   Cognitive testing was administered to [employer] by Wallace Keller, B.S. (psychometrist) under the supervision of Dr. Newman Nickels, Ph.D., licensed psychologist on 03/21/20. Ms. Biel did not appear overtly distressed by the testing session per behavioral observation or responses across self-report questionnaires. Dr. Newman Nickels, Ph.D. checked in with Ms. Elsayed as needed to manage any distress related to testing procedures (if applicable). Rest breaks were offered.    The battery of tests administered was selected by Dr. Newman Nickels, Ph.D. with consideration to Ms. Houpt's current level of functioning, the nature of her symptoms, emotional and behavioral responses during interview, level of literacy, observed level of motivation/effort, and the nature of the referral question. This battery was communicated to the psychometrist. Communication between Dr. Newman Nickels, Ph.D. and the psychometrist was ongoing throughout the evaluation and Dr. Newman Nickels, Ph.D. was immediately accessible at all times. Dr. Newman Nickels, Ph.D. provided supervision to the psychometrist on the date of this service to the extent necessary to assure the quality of all services provided.    MARVETTE SCHAMP will return within approximately 1-2 weeks for an interactive feedback session with Dr. [name] at which time her test performances, clinical impressions, and treatment recommendations will be reviewed in detail. Ms. Leeson understands she can contact our office should she require our assistance before this time.  A total of 165 minutes of billable time were spent face-to-face with Ms. Kramlich by the psychometrist. This includes both test administration and scoring time. Billing for these services is reflected in the clinical report generated by Dr. Newman Nickels, Ph.D..  This note reflects time spent with the psychometrician and does not include test scores or any  clinical interpretations made by Dr. [name]. The full report will follow in a separate note.

## 2020-04-02 ENCOUNTER — Encounter: Payer: Managed Care, Other (non HMO) | Admitting: Psychology

## 2020-04-15 ENCOUNTER — Encounter: Payer: Managed Care, Other (non HMO) | Admitting: Psychology

## 2020-04-16 ENCOUNTER — Ambulatory Visit (INDEPENDENT_AMBULATORY_CARE_PROVIDER_SITE_OTHER): Payer: Managed Care, Other (non HMO) | Admitting: Psychology

## 2020-04-16 ENCOUNTER — Other Ambulatory Visit: Payer: Self-pay

## 2020-04-16 ENCOUNTER — Other Ambulatory Visit: Payer: Self-pay | Admitting: Registered Nurse

## 2020-04-16 DIAGNOSIS — F411 Generalized anxiety disorder: Secondary | ICD-10-CM

## 2020-04-16 DIAGNOSIS — F330 Major depressive disorder, recurrent, mild: Secondary | ICD-10-CM | POA: Diagnosis not present

## 2020-04-16 MED ORDER — FLUOXETINE HCL 10 MG PO CAPS: 10.0000 mg | ORAL_CAPSULE | Freq: Every day | ORAL | 0 refills | Status: DC

## 2020-04-16 NOTE — Progress Notes (Signed)
   Neuropsychology Feedback Session Brittney Bell. [facility] [place] Department of Neurology  Reason for Referral:   Brittney Bell a 49 y.o. right-handed Caucasian female referred by Shon Millet, D.O.,to characterize hercurrent cognitive functioning and assist with diagnostic clarity and treatment planning in the context of subjective cognitive decline and potential psychiatric comorbidities.   Feedback:   Brittney Bell completed a comprehensive neuropsychological evaluation on 03/21/2020. Please refer to that encounter for the full report and recommendations. Briefly, results suggested normative impairments in semantic fluency, bilateral fine motor coordination/speed, and across a computerized executive functioning task assessing pattern recognition and adaptability. Performance variability was further exhibited across encoding (i.e., learning) and retrieval aspects of memory. Across mood-related questionnaires, she reported acute symptoms of mild depression and moderate anxiety. She further reported moderate symptoms of sleep dysfunction. Ongoing mood and sleep dysfunction can certainly impact cognitive performances across testing, especially across executive functioning and learning and memory. Given that behavioral observations were noteworthy for Brittney Bell experiencing significant anxiety during testing to the extent where she nearly became tearful on several occasions, it is also possible that acute symptoms directly impacted her ability to focus on testing material. Overall, it appears most likely that current day-to-day cognitive deficits are related to ongoing sleep dysfunction and psychiatric distress. Based upon current testing, I cannot rule out the presence of an underlying cognitive disorder. She may be experiencing very mild vascular contributions given neuroimaging suggesting mild small vessel ischemic changes. However, Brittney Bell is quite young relative to when  neurodegenerative conditions normally present and her pattern of performance across testing is not consistent most common forms of illness.  Brittney Bell was unaccompanied during the current feedback session. Content of the current session focused on the results of her neuropsychological evaluation. Brittney Bell was given the opportunity to ask questions and her questions were answered. She was encouraged to reach out should additional questions arise. A copy of her report was provided at the conclusion of the visit.      34 minutes were spent conducting the current feedback session with Brittney Bell, billed as one unit [id number].

## 2020-04-23 ENCOUNTER — Other Ambulatory Visit: Payer: Self-pay

## 2020-04-23 ENCOUNTER — Ambulatory Visit: Payer: Managed Care, Other (non HMO) | Admitting: Registered Nurse

## 2020-04-23 ENCOUNTER — Encounter: Payer: Self-pay | Admitting: Registered Nurse

## 2020-04-23 VITALS — BP 136/67 | HR 93 | Temp 98.0°F | Resp 18 | Ht 67.0 in | Wt 197.4 lb

## 2020-04-23 DIAGNOSIS — L700 Acne vulgaris: Secondary | ICD-10-CM

## 2020-04-23 MED ORDER — DOXYCYCLINE HYCLATE 100 MG PO TABS: 100.0000 mg | ORAL_TABLET | Freq: Two times a day (BID) | ORAL | 0 refills | Status: DC

## 2020-04-23 MED ORDER — CLINDAMYCIN PHOS-BENZOYL PEROX 1-5 % EX GEL: Freq: Two times a day (BID) | CUTANEOUS | 0 refills | Status: DC

## 2020-04-23 NOTE — Progress Notes (Signed)
Acute Office Visit  Subjective:    Patient ID: Brittney Bell, female    DOB: 1970-08-22, 49 y.o.   MRN: [id number]  Chief Complaint  Patient presents with  . Rash    Patient has a rash that is spreading on her face. Per patient it started with one bump now its all over and burn and is sore,    HPI Patient is in today for Rash on face  Ongoing for around 2 weeks. Started with one bump, looked like zit, now many bumps and much erythema. Worst on cheeks and over nose, some down to upper lip No hx of rosacea or autoimmune do. Recent ana negative. No fam hx of autoimmunity No other rash Has happened in remote past - was bacterial infection Does have history significant for acne vulgaris  Past Medical History:  Diagnosis Date  . Cellulitis and abscess of face 01/29/2013  . Essential hypertension, benign 05/15/2017  . Generalized anxiety disorder 05/20/2017  . Low back pain   . Major depressive disorder     Past Surgical History:  Procedure Laterality Date  . BREAST CYST EXCISION Right 03/05/2020   Procedure: Excision of right nipple cyst;  Surgeon: Manus Rudd, MD;  Location: [facility];  Service: General;  Laterality: Right;  lma  . TUBAL LIGATION      Family History  Problem Relation Age of Onset  . Heart disease Mother   . Hypertension Mother   . Bipolar disorder Brother   . ADD / ADHD Daughter   . Dementia Other        Grandmother's sister    Social History   Socioeconomic History  . Marital status: Widowed    Spouse name: Not on file  . Number of children: Not on file  . Years of education: 91  . Highest education level: Some college, no degree  Occupational History  . Not on file  Tobacco Use  . Smoking status: Former Smoker    Quit date: 11/30/2015    Years since quitting: 4.4  . Smokeless tobacco: Never Used  Substance and Sexual Activity  . Alcohol use: Yes    Alcohol/week: 0.0 standard drinks    Comment: socially  . Drug use:  No  . Sexual activity: Yes  Other Topics Concern  . Not on file  Social History Narrative   Right handed   Lives alone   Social Determinants of Health   Financial Resource Strain:   . Difficulty of Paying Living Expenses: Not on file  Food Insecurity:   . Worried About Programme researcher, broadcasting/film/video in the Last Year: Not on file  . Ran Out of Food in the Last Year: Not on file  Transportation Needs:   . Lack of Transportation (Medical): Not on file  . Lack of Transportation (Non-Medical): Not on file  Physical Activity:   . Days of Exercise per Week: Not on file  . Minutes of Exercise per Session: Not on file  Stress:   . Feeling of Stress : Not on file  Social Connections:   . Frequency of Communication with Friends and Family: Not on file  . Frequency of Social Gatherings with Friends and Family: Not on file  . Attends Religious Services: Not on file  . Active Member of Clubs or Organizations: Not on file  . Attends Banker Meetings: Not on file  . Marital Status: Not on file  Intimate Partner Violence:   . Fear of Current  or Ex-Partner: Not on file  . Emotionally Abused: Not on file  . Physically Abused: Not on file  . Sexually Abused: Not on file    Outpatient Medications Prior to Visit  Medication Sig Dispense Refill  . FLUoxetine (PROZAC) 10 MG capsule Take 1 capsule (10 mg total) by mouth daily. 90 capsule 0  . acetaminophen (TYLENOL) 500 MG tablet Take 1,000-1,500 mg by mouth every 8 (eight) hours as needed for mild pain or headache. (Patient not taking: Reported on 04/23/2020)    . diltiazem (CARDIZEM CD) 120 MG 24 hr capsule Take 1 capsule (120 mg total) by mouth daily. 90 capsule 1  . HYDROcodone-acetaminophen (NORCO/VICODIN) 5-325 MG tablet Take 1 tablet by mouth every 6 (six) hours as needed for moderate pain. (Patient not taking: Reported on 04/23/2020) 15 tablet 0  . ibuprofen (ADVIL) 200 MG tablet Take 600-800 mg by mouth every 6 (six) hours as needed for  headache or moderate pain. (Patient not taking: Reported on 04/23/2020)     No facility-administered medications prior to visit.    Allergies  Allergen Reactions  . Azithromycin Hives  . Tizanidine Other (See Comments)    Hypotension, severe 80/50s  . Levaquin [Levofloxacin] Palpitations    Review of Systems  Constitutional: Negative.   HENT: Negative.   Eyes: Negative.   Respiratory: Negative.   Cardiovascular: Negative.   Gastrointestinal: Negative.   Genitourinary: Negative.   Musculoskeletal: Negative.   Skin: Negative.        Acne and redness across face, itching and burning  Neurological: Negative.   Psychiatric/Behavioral: Negative.        Objective:    Physical Exam Vitals and nursing note reviewed.  Constitutional:      General: She is not in acute distress.    Appearance: Normal appearance. She is not ill-appearing, toxic-appearing or diaphoretic.  Cardiovascular:     Rate and Rhythm: Normal rate and regular rhythm.  Pulmonary:     Effort: Pulmonary effort is normal. No respiratory distress.  Skin:    General: Skin is warm and dry.     Coloration: Skin is not jaundiced or pale.     Findings: Erythema, lesion and rash present. No bruising. Rash is nodular and pustular.     Nails: There is no clubbing.       Neurological:     General: No focal deficit present.     Mental Status: She is alert and oriented to person, place, and time. Mental status is at baseline.  Psychiatric:        Mood and Affect: Mood normal.        Behavior: Behavior normal.        Thought Content: Thought content normal.        Judgment: Judgment normal.     BP 136/67   Pulse 93   Temp 98 F (36.7 C) (Temporal)   Resp 18   Ht 5\' 7"  (1.702 m)   Wt 197 lb 6.4 oz (89.5 kg)   SpO2 100%   BMI 30.92 kg/m  Wt Readings from Last 3 Encounters:  04/23/20 197 lb 6.4 oz (89.5 kg)  03/05/20 200 lb 2.8 oz (90.8 kg)  02/07/20 199 lb 3.2 oz (90.4 kg)    There are no preventive  care reminders to display for this patient.  There are no preventive care reminders to display for this patient.   Lab Results  Component Value Date   TSH 2.160 01/02/2020   Lab Results  Component  Value Date   WBC 6.0 01/02/2020   HGB 11.1 01/02/2020   HCT 35.4 01/02/2020   MCV 78.9 01/02/2020   PLT 376 09/24/2019   Lab Results  Component Value Date   NA 139 01/02/2020   K 4.6 01/02/2020   CO2 21 01/02/2020   GLUCOSE 102 (H) 01/02/2020   BUN 11 01/02/2020   CREATININE 0.75 01/02/2020   BILITOT 0.4 01/02/2020   ALKPHOS 94 01/02/2020   AST 12 01/02/2020   ALT 8 01/02/2020   PROT 7.9 01/02/2020   ALBUMIN 4.8 01/02/2020   CALCIUM 9.4 01/02/2020   ANIONGAP 9 09/24/2019   No results found for: CHOL No results found for: HDL No results found for: LDLCALC No results found for: TRIG No results found for: CHOLHDL Lab Results  Component Value Date   HGBA1C 5.3 01/02/2020       Assessment & Plan:   Problem List Items Addressed This Visit    None    Visit Diagnoses    Acne vulgaris    -  Primary   Relevant Medications   doxycycline (VIBRA-TABS) 100 MG tablet   clindamycin-benzoyl peroxide (BENZACLIN) gel       Meds ordered this encounter  Medications  . doxycycline (VIBRA-TABS) 100 MG tablet    Sig: Take 1 tablet (100 mg total) by mouth 2 (two) times daily.    Dispense:  10 tablet    Refill:  0    Order Specific Question:   Supervising Provider    Answer:   Neva Seat, JEFFREY R [[id number]]  . clindamycin-benzoyl peroxide (BENZACLIN) gel    Sig: Apply topically 2 (two) times daily.    Dispense:  25 g    Refill:  0    Order Specific Question:   Supervising Provider    Answer:   Neva Seat, JEFFREY R [[id number]]   PLAN  Feel that this is likely infectious rather than inflammatory/autoimmune  Doxycycline 100mg  PO bid and benzaclin twice daily  If no improvement return to clinic  Patient encouraged to call clinic with any questions, comments, or concerns.   , NP

## 2020-04-23 NOTE — Patient Instructions (Signed)
° ° ° °  If you have lab work done today you will be contacted with your lab results within the next 2 weeks.  If you have not heard from us then please contact us. The fastest way to get your results is to register for My Chart. ° ° °IF you received an x-ray today, you will receive an invoice from [facility]. Please contact [facility] at [phone] with questions or concerns regarding your invoice.  ° °IF you received labwork today, you will receive an invoice from [facility]. Please contact [facility] at [phone] with questions or concerns regarding your invoice.  ° °Our billing staff will not be able to assist you with questions regarding bills from these companies. ° °You will be contacted with the lab results as soon as they are available. The fastest way to get your results is to activate your My Chart account. Instructions are located on the last page of this paperwork. If you have not heard from us regarding the results in 2 weeks, please contact this office. °  ° ° ° °

## 2020-06-11 ENCOUNTER — Ambulatory Visit
Admission: EM | Admit: 2020-06-11 | Discharge: 2020-06-11 | Disposition: A | Discharge status: Home / Self Care | Payer: Managed Care, Other (non HMO) | Attending: Internal Medicine | Admitting: Internal Medicine

## 2020-06-11 ENCOUNTER — Other Ambulatory Visit: Payer: Self-pay

## 2020-06-11 ENCOUNTER — Encounter: Payer: Self-pay | Admitting: Emergency Medicine

## 2020-06-11 DIAGNOSIS — J019 Acute sinusitis, unspecified: Secondary | ICD-10-CM

## 2020-06-11 DIAGNOSIS — B9789 Other viral agents as the cause of diseases classified elsewhere: Secondary | ICD-10-CM | POA: Diagnosis not present

## 2020-06-11 MED ORDER — GUAIFENESIN ER 600 MG PO TB12: 600.0000 mg | ORAL_TABLET | Freq: Two times a day (BID) | ORAL | 0 refills | Status: AC

## 2020-06-11 MED ORDER — FLUTICASONE PROPIONATE 50 MCG/ACT NA SUSP: 1.0000 | Freq: Every day | NASAL | 0 refills | Status: DC

## 2020-06-11 NOTE — Discharge Instructions (Signed)
Please quarantine until COVID-19 test results are available If you have worsening symptoms please return to urgent care to be reevaluated Humidifier use is helpful

## 2020-06-11 NOTE — ED Provider Notes (Signed)
[facility]    CSN: [id number] Arrival date & time: 06/11/20  1632      History   Chief Complaint Chief Complaint  Patient presents with  . Nasal Congestion    HPI Brittney Bell is a 50 y.o. female comes to the urgent care with 3-day history of nasal congestion.  Patient denies any fever or chills.  No generalized body aches.  No headaches.  She has some postnasal drip.  Patient has been fully vaccinated against COVID-19 virus.  He is due for her booster shot.  No change in voice.   She was exposed to a COVID-positive individual couple of weeks ago but she has never had any symptoms prior to now.  She is requesting COVID-19 test so she can return to work.  HPI  Past Medical History:  Diagnosis Date  . Cellulitis and abscess of face 01/29/2013  . Essential hypertension, benign 05/15/2017  . Generalized anxiety disorder 05/20/2017  . Low back pain   . Major depressive disorder     Patient Active Problem List   Diagnosis Date Noted  . Major depressive disorder   . Low back pain   . Generalized anxiety disorder 05/20/2017  . Essential hypertension, benign 05/15/2017    Past Surgical History:  Procedure Laterality Date  . BREAST CYST EXCISION Right 03/05/2020   Procedure: Excision of right nipple cyst;  Surgeon: Manus Rudd, MD;  Location: [facility];  Service: General;  Laterality: Right;  lma  . TUBAL LIGATION      OB History   No obstetric history on file.      Home Medications    Prior to Admission medications   Medication Sig Start Date End Date Taking? Authorizing Provider  fluticasone (FLONASE) 50 MCG/ACT nasal spray Place 1 spray into both nostrils daily. 06/11/20  Yes Shamarr Faucett, Britta Mccreedy, MD  guaiFENesin (MUCINEX) 600 MG 12 hr tablet Take 1 tablet (600 mg total) by mouth 2 (two) times daily for 15 days. 06/11/20 06/26/20 Yes Lakelynn Severtson, Britta Mccreedy, MD  clindamycin-benzoyl peroxide [facility]) gel Apply topically 2 (two) times daily.  04/23/20   [name], NP  diltiazem (CARDIZEM CD) 120 MG 24 hr capsule Take 1 capsule (120 mg total) by mouth daily. 09/27/19 12/26/19  Tobb, Kardie, DO  doxycycline (VIBRA-TABS) 100 MG tablet Take 1 tablet (100 mg total) by mouth 2 (two) times daily. 04/23/20   [name], NP  FLUoxetine (PROZAC) 10 MG capsule Take 1 capsule (10 mg total) by mouth daily. 04/16/20   [name], NP    Family History Family History  Problem Relation Age of Onset  . Heart disease Mother   . Hypertension Mother   . Bipolar disorder Brother   . ADD / ADHD Daughter   . Dementia Other        Grandmother's sister    Social History Social History   Tobacco Use  . Smoking status: Former Smoker    Quit date: 11/30/2015    Years since quitting: 4.5  . Smokeless tobacco: Never Used  Substance Use Topics  . Alcohol use: Yes    Alcohol/week: 0.0 standard drinks    Comment: socially  . Drug use: No     Allergies   Azithromycin, Tizanidine, and Levaquin [levofloxacin]   Review of Systems Review of Systems  HENT: Positive for congestion and sinus pressure. Negative for sinus pain, tinnitus and voice change.   Respiratory: Positive for cough. Negative for shortness of breath.  Physical Exam Triage Vital Signs ED Triage Vitals  Enc Vitals Group     BP 06/11/20 1824 (!) 149/89     Pulse Rate 06/11/20 1824 68     Resp 06/11/20 1824 18     Temp 06/11/20 1824 98.5 F (36.9 C)     Temp Source 06/11/20 1824 Oral     SpO2 06/11/20 1824 98 %     Weight --      Height --      Head Circumference --      Peak Flow --      Pain Score 06/11/20 1829 0     Pain Loc --      Pain Edu? --      Excl. in GC? --    No data found.  Updated Vital Signs BP (!) 149/89   Pulse 68   Temp 98.5 F (36.9 C) (Oral)   Resp 18   SpO2 98%   Visual Acuity Right Eye Distance:   Left Eye Distance:   Bilateral Distance:    Right Eye Near:   Left Eye Near:    Bilateral Near:     Physical  Exam Vitals and nursing note reviewed.  HENT:     Right Ear: Tympanic membrane normal.     Left Ear: Tympanic membrane normal.     Nose: Congestion present.  Cardiovascular:     Rate and Rhythm: Normal rate and regular rhythm.  Neurological:     Mental Status: She is alert.      UC Treatments / Results  Labs (all labs ordered are listed, but only abnormal results are displayed) Labs Reviewed  NOVEL CORONAVIRUS, NAA    EKG   Radiology No results found.  Procedures Procedures (including critical care time)  Medications Ordered in UC Medications - No data to display  Initial Impression / Assessment and Plan / UC Course  I have reviewed the triage vital signs and the nursing notes.  Pertinent labs & imaging results that were available during my care of the patient were reviewed by me and considered in my medical decision making (see chart for details).     1.  Acute viral sinusitis: Continue Flonase, Mucinex use Humidifier is helpful COVID-19 PCR test has been sent We will call you with results if abnormal. Final Clinical Impressions(s) / UC Diagnoses   Final diagnoses:  Acute viral sinusitis     Discharge Instructions     Please quarantine until COVID-19 test results are available If you have worsening symptoms please return to urgent care to be reevaluated Humidifier use is helpful   ED Prescriptions    Medication Sig Dispense Auth. Provider   fluticasone (FLONASE) 50 MCG/ACT nasal spray Place 1 spray into both nostrils daily. 16 g [name], MD   guaiFENesin (MUCINEX) 600 MG 12 hr tablet Take 1 tablet (600 mg total) by mouth 2 (two) times daily for 15 days. 30 tablet Topaz Raglin, Britta Mccreedy, MD     PDMP not reviewed this encounter.   [name], MD 06/11/20 Ernestina [place]

## 2020-06-11 NOTE — ED Triage Notes (Signed)
Pt here for nasal congestion x 3 days and would like covid testing

## 2020-06-13 LAB — SARS-COV-2, NAA 2 DAY TAT

## 2020-06-13 LAB — NOVEL CORONAVIRUS, NAA: SARS-CoV-2, NAA: DETECTED — AB

## 2020-07-09 ENCOUNTER — Other Ambulatory Visit: Payer: Self-pay | Admitting: Registered Nurse

## 2020-07-09 DIAGNOSIS — F411 Generalized anxiety disorder: Secondary | ICD-10-CM

## 2020-07-12 ENCOUNTER — Ambulatory Visit: Payer: Managed Care, Other (non HMO) | Admitting: Family Medicine

## 2020-07-12 ENCOUNTER — Other Ambulatory Visit: Payer: Self-pay

## 2020-07-12 ENCOUNTER — Encounter: Payer: Self-pay | Admitting: Family Medicine

## 2020-07-12 VITALS — BP 134/78 | HR 75 | Temp 99.2°F | Ht 67.0 in | Wt 202.0 lb

## 2020-07-12 DIAGNOSIS — G4700 Insomnia, unspecified: Secondary | ICD-10-CM

## 2020-07-12 DIAGNOSIS — Z8679 Personal history of other diseases of the circulatory system: Secondary | ICD-10-CM

## 2020-07-12 DIAGNOSIS — I491 Atrial premature depolarization: Secondary | ICD-10-CM

## 2020-07-12 DIAGNOSIS — F411 Generalized anxiety disorder: Secondary | ICD-10-CM

## 2020-07-12 MED ORDER — FLUOXETINE HCL 20 MG PO TABS: 20.0000 mg | ORAL_TABLET | Freq: Every day | ORAL | 3 refills | Status: DC

## 2020-07-12 NOTE — Patient Instructions (Addendum)
Melatonin or tylenol PM for sleep. See info on sleep below. Higher dose of flouxetine may help.   If any increased palpitations be seen as that could be related to other conditions.  I will check electrolytes including potassium today.   Follow up in 3 months, but let me know if there are questions sooner. Thanks for coming in today.  Managing Anxiety, Adult After being diagnosed with an anxiety disorder, you may be relieved to know why you have felt or behaved a certain way. You may also feel overwhelmed about the treatment ahead and what it will mean for your life. With care and support, you can manage this condition and recover from it. How to manage lifestyle changes Managing stress and anxiety Stress is your body's reaction to life changes and events, both good and bad. Most stress will last just a few hours, but stress can be ongoing and can lead to more than just stress. Although stress can play a major role in anxiety, it is not the same as anxiety. Stress is usually caused by something external, such as a deadline, test, or competition. Stress normally passes after the triggering event has ended.  Anxiety is caused by something internal, such as imagining a terrible outcome or worrying that something will go wrong that will devastate you. Anxiety often does not go away even after the triggering event is over, and it can become long-term (chronic) worry. It is important to understand the differences between stress and anxiety and to manage your stress effectively so that it does not lead to an anxious response. Talk with your health care provider or a counselor to learn more about reducing anxiety and stress. He or she may suggest tension reduction techniques, such as:  Music therapy. This can include creating or listening to music that you enjoy and that inspires you.  Mindfulness-based meditation. This involves being aware of your normal breaths while not trying to control your breathing.  It can be done while sitting or walking.  Centering prayer. This involves focusing on a word, phrase, or sacred image that means something to you and brings you peace.  Deep breathing. To do this, expand your stomach and inhale slowly through your nose. Hold your breath for 3-5 seconds. Then exhale slowly, letting your stomach muscles relax.  Self-talk. This involves identifying thought patterns that lead to anxiety reactions and changing those patterns.  Muscle relaxation. This involves tensing muscles and then relaxing them. Choose a tension reduction technique that suits your lifestyle and personality. These techniques take time and practice. Set aside 5-15 minutes a day to do them. Therapists can offer counseling and training in these techniques. The training to help with anxiety may be covered by some insurance plans. Other things you can do to manage stress and anxiety include:  Keeping a stress/anxiety diary. This can help you learn what triggers your reaction and then learn ways to manage your response.  Thinking about how you react to certain situations. You may not be able to control everything, but you can control your response.  Making time for activities that help you relax and not feeling guilty about spending your time in this way.  Visual imagery and yoga can help you stay calm and relax.   Medicines Medicines can help ease symptoms. Medicines for anxiety include:  Anti-anxiety drugs.  Antidepressants. Medicines are often used as a primary treatment for anxiety disorder. Medicines will be prescribed by a health care provider. When used together, medicines, psychotherapy,  and tension reduction techniques may be the most effective treatment. Relationships Relationships can play a big part in helping you recover. Try to spend more time connecting with trusted friends and family members. Consider going to couples counseling, taking family education classes, or going to family  therapy. Therapy can help you and others better understand your condition. How to recognize changes in your anxiety Everyone responds differently to treatment for anxiety. Recovery from anxiety happens when symptoms decrease and stop interfering with your daily activities at home or work. This may mean that you will start to:  Have better concentration and focus. Worry will interfere less in your daily thinking.  Sleep better.  Be less irritable.  Have more energy.  Have improved memory. It is important to recognize when your condition is getting worse. Contact your health care provider if your symptoms interfere with home or work and you feel like your condition is not improving. Follow these instructions at home: Activity  Exercise. Most adults should do the following: ? Exercise for at least 150 minutes each week. The exercise should increase your heart rate and make you sweat (moderate-intensity exercise). ? Strengthening exercises at least twice a week.  Get the right amount and quality of sleep. Most adults need 7-9 hours of sleep each night. Lifestyle  Eat a healthy diet that includes plenty of vegetables, fruits, whole grains, low-fat dairy products, and lean protein. Do not eat a lot of foods that are high in solid fats, added sugars, or salt.  Make choices that simplify your life.  Do not use any products that contain nicotine or tobacco, such as cigarettes, e-cigarettes, and chewing tobacco. If you need help quitting, ask your health care provider.  Avoid caffeine, alcohol, and certain over-the-counter cold medicines. These may make you feel worse. Ask your pharmacist which medicines to avoid.   General instructions  Take over-the-counter and prescription medicines only as told by your health care provider.  Keep all follow-up visits as told by your health care provider. This is important. Where to find support You can get help and support from these  sources:  Self-help groups.  Online and [employer].  A trusted spiritual leader.  Couples counseling.  Family education classes.  Family therapy. Where to find more information You may find that joining a support group helps you deal with your anxiety. The following sources can help you locate counselors or support groups near you:  Mental Health America: www.mentalhealthamerica.net  Anxiety and Depression Association of [place] (ADAA): ProgramCam.de  [employer]ness (NAMI): www.nami.org Contact a health care provider if you:  Have a hard time staying focused or finishing daily tasks.  Spend many hours a day feeling worried about everyday life.  Become exhausted by worry.  Start to have headaches, feel tense, or have nausea.  Urinate more than normal.  Have diarrhea. Get help right away if you have:  A racing heart and shortness of breath.  Thoughts of hurting yourself or others. If you ever feel like you may hurt yourself or others, or have thoughts about taking your own life, get help right away. You can go to your nearest emergency department or call:  Your local emergency services (911 in the [place]  A suicide crisis helpline, such as the National Suicide Prevention Lifeline at 228-545-6665. This is open 24 hours a day. Summary  Taking steps to learn and use tension reduction techniques can help calm you and help prevent triggering an anxiety reaction.  When used together, medicines, psychotherapy, and tension reduction techniques may be the most effective treatment.  Family, friends, and partners can play a big part in helping you recover from an anxiety disorder. This information is not intended to replace advice given to you by your health care provider. Make sure you discuss any questions you have with your health care provider. Document Revised: 10/18/2018 Document Reviewed: 10/18/2018 Elsevier Patient Education  2021  [employer]   Insomnia Insomnia is a sleep disorder that makes it difficult to fall asleep or stay asleep. Insomnia can cause fatigue, low energy, difficulty concentrating, mood swings, and poor performance at work or school. There are three different ways to classify insomnia:  Difficulty falling asleep.  Difficulty staying asleep.  Waking up too early in the morning. Any type of insomnia can be long-term (chronic) or short-term (acute). Both are common. Short-term insomnia usually lasts for three months or less. Chronic insomnia occurs at least three times a week for longer than three months. What are the causes? Insomnia may be caused by another condition, situation, or substance, such as:  Anxiety.  Certain medicines.  Gastroesophageal reflux disease (GERD) or other gastrointestinal conditions.  Asthma or other breathing conditions.  Restless legs syndrome, sleep apnea, or other sleep disorders.  Chronic pain.  Menopause.  Stroke.  Abuse of alcohol, tobacco, or illegal drugs.  Mental health conditions, such as depression.  Caffeine.  Neurological disorders, such as Alzheimer's disease.  An overactive thyroid (hyperthyroidism). Sometimes, the cause of insomnia may not be known. What increases the risk? Risk factors for insomnia include:  Gender. Women are affected more often than men.  Age. Insomnia is more common as you get older.  Stress.  Lack of exercise.  Irregular work schedule or working night shifts.  Traveling between different time zones.  Certain medical and mental health conditions. What are the signs or symptoms? If you have insomnia, the main symptom is having trouble falling asleep or having trouble staying asleep. This may lead to other symptoms, such as:  Feeling fatigued or having low energy.  Feeling nervous about going to sleep.  Not feeling rested in the morning.  Having trouble concentrating.  Feeling irritable, anxious,  or depressed. How is this diagnosed? This condition may be diagnosed based on:  Your symptoms and medical history. Your health care provider may ask about: ? Your sleep habits. ? Any medical conditions you have. ? Your mental health.  A physical exam. How is this treated? Treatment for insomnia depends on the cause. Treatment may focus on treating an underlying condition that is causing insomnia. Treatment may also include:  Medicines to help you sleep.  Counseling or therapy.  Lifestyle adjustments to help you sleep better. Follow these instructions at home: Eating and drinking  Limit or avoid alcohol, caffeinated beverages, and cigarettes, especially close to bedtime. These can disrupt your sleep.  Do not eat a large meal or eat spicy foods right before bedtime. This can lead to digestive discomfort that can make it hard for you to sleep.   Sleep habits  Keep a sleep diary to help you and your health care provider figure out what could be causing your insomnia. Write down: ? When you sleep. ? When you wake up during the night. ? How well you sleep. ? How rested you feel the next day. ? Any side effects of medicines you are taking. ? What you eat and drink.  Make your bedroom a dark, comfortable place where it  is easy to fall asleep. ? Put up shades or blackout curtains to block light from outside. ? Use a white noise machine to block noise. ? Keep the temperature cool.  Limit screen use before bedtime. This includes: ? Watching TV. ? Using your smartphone, tablet, or computer.  Stick to a routine that includes going to bed and waking up at the same times every day and night. This can help you fall asleep faster. Consider making a quiet activity, such as reading, part of your nighttime routine.  Try to avoid taking naps during the day so that you sleep better at night.  Get out of bed if you are still awake after 15 minutes of trying to sleep. Keep the lights down, but  try reading or doing a quiet activity. When you feel sleepy, go back to bed.   General instructions  Take over-the-counter and prescription medicines only as told by your health care provider.  Exercise regularly, as told by your health care provider. Avoid exercise starting several hours before bedtime.  Use relaxation techniques to manage stress. Ask your health care provider to suggest some techniques that may work well for you. These may include: ? Breathing exercises. ? Routines to release muscle tension. ? Visualizing peaceful scenes.  Make sure that you drive carefully. Avoid driving if you feel very sleepy.  Keep all follow-up visits as told by your health care provider. This is important. Contact a health care provider if:  You are tired throughout the day.  You have trouble in your daily routine due to sleepiness.  You continue to have sleep problems, or your sleep problems get worse. Get help right away if:  You have serious thoughts about hurting yourself or someone else. If you ever feel like you may hurt yourself or others, or have thoughts about taking your own life, get help right away. You can go to your nearest emergency department or call:  Your local emergency services (911 in the [place]  A suicide crisis helpline, such as the National Suicide Prevention Lifeline at (250) 133-7683. This is open 24 hours a day. Summary  Insomnia is a sleep disorder that makes it difficult to fall asleep or stay asleep.  Insomnia can be long-term (chronic) or short-term (acute).  Treatment for insomnia depends on the cause. Treatment may focus on treating an underlying condition that is causing insomnia.  Keep a sleep diary to help you and your health care provider figure out what could be causing your insomnia. This information is not intended to replace advice given to you by your health care provider. Make sure you discuss any questions you have with your health care  provider. Document Revised: 03/28/2020 Document Reviewed: 03/28/2020 Elsevier Patient Education  2021 [employer].     If you have lab work done today you will be contacted with your lab results within the next 2 weeks.  If you have not heard from [place] then please contact us. The fastest way to get your results is to register for My Chart.   IF you received an x-ray today, you will receive an invoice from [facility]. Please contact [facility] at [phone] with questions or concerns regarding your invoice.   IF you received labwork today, you will receive an invoice from [facility]. Please contact [facility] at [phone] with questions or concerns regarding your invoice.   Our billing staff will not be able to assist you with questions regarding bills from these companies.  You will be contacted  with the lab results as soon as they are available. The fastest way to get your results is to activate your My Chart account. Instructions are located on the last page of this paperwork. If you have not heard from us regarding the results in 2 weeks, please contact this office.     

## 2020-07-12 NOTE — Progress Notes (Signed)
Subjective:  Patient ID: Brittney Bell, female    DOB: [birth year]  Age: 50 y.o. MRN: [id number]  CC:  Chief Complaint  Patient presents with  . Transitions Of Care    Pt reports feeling well with no complaints. Pt would like to discuss an increase in the Prozac medication.     HPI Brittney Bell presents for  Transfer of care. Previous primary care provider Dr. [name].   Generalized anxiety disorder/depression. Currently taking Prozac 10 mg daily. Neuropsychology session with Dr. [name] in November. Referred from neurology at that time to characterize cognitive functioning. Note reviewed. Ongoing mood and sleep dysfunction thought to be possibly impacting cognitive performance across testing. Anxiety during testing. Thought to be impacting ability to focus on testing material. Sleep dysfunction and psychiatric distress thought to be impacting cognitive deficits. Previous neuroimaging suggested mild small vessel ischemic changes but performance across testing was not consistent with most common forms of illness/underlying cognitive disorder.  Had been on fluoxetine in past on and off. Up to 30-40mg  in past. Husband passed in 2001. Off meds for 2 years, more anxious past year. Restarted fluoxetine at 10mg  per day. Helped initiailly, sleeping better. Better focus, less anxious, but now feeling more anxious past month or so - more trouble sleeping.  Difficulty sleeping - trouble getting to sleep and staying asleep. 4-5 hrs sleep.  No hx of OSA, no snoring. Tx: tries to avoid meds. No current treatments.   Alcohol:social - about 7 glasses wine per night.     Depression screen [facility] 2/9 07/12/2020 04/23/2020 03/08/2018 12/07/2017 06/03/2017  Decreased Interest 0 0 0 0 0  Down, Depressed, Hopeless 0 0 0 0 0  PHQ - 2 Score 0 0 0 0 0  Altered sleeping - - - - -  Tired, decreased energy - - - - -  Change in appetite - - - - -  Feeling bad or failure about yourself  - - - - -  Trouble  concentrating - - - - -  Moving slowly or fidgety/restless - - - - -  Suicidal thoughts - - - - -  PHQ-9 Score - - - - -  Difficult doing work/chores - - - - -   GAD 7 : Generalized Anxiety Score 01/02/2020  Nervous, Anxious, on Edge 1  Control/stop worrying 1  Worry too much - different things 2  Trouble relaxing 0  Restless 0  Easily annoyed or irritable 0  Afraid - awful might happen 1  Total GAD 7 Score 5  Anxiety Difficulty Not difficult at all   Hypertension/palpitations with PAC's.  Cardizem CD 120 mg daily rx in past. No meds since last July. Cardiology appt 10/04/19, improved palpitations. Planned continued diltiazem - stopped as did not feel like herself.  Outside BPs 120/80 off meds.  Occasional palpitations - when anxious. No CP, no near syncope or dyspnea with palpitations.  Potassium supplement discussed in past - none recently.  Lab Results  Component Value Date   TSH 2.160 01/02/2020   BP Readings from Last 3 Encounters:  07/12/20 134/78  06/11/20 (!) 149/89  04/23/20 136/67   Lab Results  Component Value Date   CREATININE 0.75 01/02/2020    History Patient Active Problem List   Diagnosis Date Noted  . Major depressive disorder   . Low back pain   . Generalized anxiety disorder 05/20/2017  . Essential hypertension, benign 05/15/2017   Past Medical History:  Diagnosis Date  . Cellulitis and  abscess of face 01/29/2013  . Essential hypertension, benign 05/15/2017  . Generalized anxiety disorder 05/20/2017  . Low back pain   . Major depressive disorder    Past Surgical History:  Procedure Laterality Date  . BREAST CYST EXCISION Right 03/05/2020   Procedure: Excision of right nipple cyst;  Surgeon: Manus Rudd, MD;  Location: [facility];  Service: General;  Laterality: Right;  lma  . TUBAL LIGATION     Allergies  Allergen Reactions  . Azithromycin Hives  . Tizanidine Other (See Comments)    Hypotension, severe 80/50s  . Levaquin  [Levofloxacin] Palpitations   Prior to Admission medications   Medication Sig Start Date End Date Taking? Authorizing Provider  FLUoxetine (PROZAC) 10 MG capsule TAKE ONE CAPSULE (10 MG TOTAL) BY MOUTH ONCE DAILY 07/09/20  Yes [name], NP  clindamycin-benzoyl peroxide (BENZACLIN) gel Apply topically 2 (two) times daily. Patient not taking: Reported on 07/12/2020 04/23/20   [name], NP  diltiazem (CARDIZEM CD) 120 MG 24 hr capsule Take 1 capsule (120 mg total) by mouth daily. 09/27/19 12/26/19  Tobb, Kardie, DO  doxycycline (VIBRA-TABS) 100 MG tablet Take 1 tablet (100 mg total) by mouth 2 (two) times daily. Patient not taking: Reported on 07/12/2020 04/23/20   [name], NP  fluticasone [facility]) 50 MCG/ACT nasal spray Place 1 spray into both nostrils daily. Patient not taking: Reported on 07/12/2020 06/11/20   [name], MD   Social History   Socioeconomic History  . Marital status: Widowed    Spouse name: Not on file  . Number of children: Not on file  . Years of education: 41  . Highest education level: Some college, no degree  Occupational History  . Not on file  Tobacco Use  . Smoking status: Former Smoker    Quit date: 11/30/2015    Years since quitting: 4.6  . Smokeless tobacco: Never Used  Substance and Sexual Activity  . Alcohol use: Yes    Alcohol/week: 0.0 standard drinks    Comment: socially  . Drug use: No  . Sexual activity: Yes  Other Topics Concern  . Not on file  Social History Narrative   Right handed   Lives alone   Social Determinants of Health   Financial Resource Strain: Not on file  Food Insecurity: Not on file  Transportation Needs: Not on file  Physical Activity: Not on file  Stress: Not on file  Social Connections: Not on file  Intimate Partner Violence: Not on file    Review of Systems Per HPI.   Objective:   Vitals:   07/12/20 1546 07/12/20 1548  BP: (!) 142/80 134/78  Pulse: 75   Temp: 99.2 F (37.3 C)    TempSrc: Temporal   SpO2: 98%   Weight: 202 lb (91.6 kg)   Height: 5\' 7"  (1.702 m)      Physical Exam Vitals reviewed.  Constitutional:      Appearance: She is well-developed and well-nourished.  HENT:     Head: Normocephalic and atraumatic.  Eyes:     Extraocular Movements: EOM normal.     Conjunctiva/sclera: Conjunctivae normal.     Pupils: Pupils are equal, round, and reactive to light.  Neck:     Vascular: No carotid bruit.  Cardiovascular:     Rate and Rhythm: Normal rate and regular rhythm.     Pulses: Intact distal pulses.     Heart sounds: Normal heart sounds.  Pulmonary:     Effort: Pulmonary  effort is normal.     Breath sounds: Normal breath sounds.  Abdominal:     Palpations: Abdomen is soft. There is no pulsatile mass.     Tenderness: There is no abdominal tenderness.  Skin:    General: Skin is warm and dry.  Neurological:     Mental Status: She is alert and oriented to person, place, and time.  Psychiatric:        Attention and Perception: Attention and perception normal.        Mood and Affect: Mood and affect and mood normal.        Speech: Speech normal.        Behavior: Behavior normal. Behavior is cooperative.      spent during visit, greater than 50% counseling and assimilation of information, chart review, and discussion of plan.    Assessment & Plan:  SHAWNELL DYKES is a 50 y.o. female . Generalized anxiety disorder - Plan: FLUoxetine (PROZAC) 20 MG tablet Insomnia, unspecified type  -Some initial improvement on 10 mg dose, then recurrence anxiety, difficulty with sleep.  Will try higher dose at 20 mg, option of melatonin or Tylenol PM if needed for sleep as well as sleep hygiene discussed.  Handout given.  Handout also given on managing anxiety.  Recheck 3 months.  Premature atrial contractions History of hypertension - Plan: Basic metabolic panel  -Improved palpitations, check BMP with previous concern of hypokalemia effect on  PACs.  RTC precautions if new or worsening symptoms.    Meds ordered this encounter  Medications  . FLUoxetine (PROZAC) 20 MG tablet    Sig: Take 1 tablet (20 mg total) by mouth daily.    Dispense:  30 tablet    Refill:  3   Patient Instructions    Melatonin or tylenol PM for sleep. See info on sleep below. Higher dose of flouxetine may help.  If any increased palpitations be seen as that could be related to other conditions.    Managing Anxiety, Adult After being diagnosed with an anxiety disorder, you may be relieved to know why you have felt or behaved a certain way. You may also feel overwhelmed about the treatment ahead and what it will mean for your life. With care and support, you can manage this condition and recover from it. How to manage lifestyle changes Managing stress and anxiety Stress is your body's reaction to life changes and events, both good and bad. Most stress will last just a few hours, but stress can be ongoing and can lead to more than just stress. Although stress can play a major role in anxiety, it is not the same as anxiety. Stress is usually caused by something external, such as a deadline, test, or competition. Stress normally passes after the triggering event has ended.  Anxiety is caused by something internal, such as imagining a terrible outcome or worrying that something will go wrong that will devastate you. Anxiety often does not go away even after the triggering event is over, and it can become long-term (chronic) worry. It is important to understand the differences between stress and anxiety and to manage your stress effectively so that it does not lead to an anxious response. Talk with your health care provider or a counselor to learn more about reducing anxiety and stress. He or she may suggest tension reduction techniques, such as:  Music therapy. This can include creating or listening to music that you enjoy and that inspires you.  Mindfulness-based  meditation. This involves being aware of your normal breaths while not trying to control your breathing. It can be done while sitting or walking.  Centering prayer. This involves focusing on a word, phrase, or sacred image that means something to you and brings you peace.  Deep breathing. To do this, expand your stomach and inhale slowly through your nose. Hold your breath for 3-5 seconds. Then exhale slowly, letting your stomach muscles relax.  Self-talk. This involves identifying thought patterns that lead to anxiety reactions and changing those patterns.  Muscle relaxation. This involves tensing muscles and then relaxing them. Choose a tension reduction technique that suits your lifestyle and personality. These techniques take time and practice. Set aside 5-15 minutes a day to do them. Therapists can offer counseling and training in these techniques. The training to help with anxiety may be covered by some insurance plans. Other things you can do to manage stress and anxiety include:  Keeping a stress/anxiety diary. This can help you learn what triggers your reaction and then learn ways to manage your response.  Thinking about how you react to certain situations. You may not be able to control everything, but you can control your response.  Making time for activities that help you relax and not feeling guilty about spending your time in this way.  Visual imagery and yoga can help you stay calm and relax.   Medicines Medicines can help ease symptoms. Medicines for anxiety include:  Anti-anxiety drugs.  Antidepressants. Medicines are often used as a primary treatment for anxiety disorder. Medicines will be prescribed by a health care provider. When used together, medicines, psychotherapy, and tension reduction techniques may be the most effective treatment. Relationships Relationships can play a big part in helping you recover. Try to spend more time connecting with trusted friends and  family members. Consider going to couples counseling, taking family education classes, or going to family therapy. Therapy can help you and others better understand your condition. How to recognize changes in your anxiety Everyone responds differently to treatment for anxiety. Recovery from anxiety happens when symptoms decrease and stop interfering with your daily activities at home or work. This may mean that you will start to:  Have better concentration and focus. Worry will interfere less in your daily thinking.  Sleep better.  Be less irritable.  Have more energy.  Have improved memory. It is important to recognize when your condition is getting worse. Contact your health care provider if your symptoms interfere with home or work and you feel like your condition is not improving. Follow these instructions at home: Activity  Exercise. Most adults should do the following: ? Exercise for at least 150 minutes each week. The exercise should increase your heart rate and make you sweat (moderate-intensity exercise). ? Strengthening exercises at least twice a week.  Get the right amount and quality of sleep. Most adults need 7-9 hours of sleep each night. Lifestyle  Eat a healthy diet that includes plenty of vegetables, fruits, whole grains, low-fat dairy products, and lean protein. Do not eat a lot of foods that are high in solid fats, added sugars, or salt.  Make choices that simplify your life.  Do not use any products that contain nicotine or tobacco, such as cigarettes, e-cigarettes, and chewing tobacco. If you need help quitting, ask your health care provider.  Avoid caffeine, alcohol, and certain over-the-counter cold medicines. These may make you feel worse. Ask your pharmacist which medicines to avoid.   General instructions  Take over-the-counter and prescription medicines only as told by your health care provider.  Keep all follow-up visits as told by your health care  provider. This is important. Where to find support You can get help and support from these sources:  Self-help groups.  Online and [employer] organizations.  A trusted spiritual leader.  Couples counseling.  Family education classes.  Family therapy. Where to find more information You may find that joining a support group helps you deal with your anxiety. The following sources can help you locate counselors or support groups near you:  Mental Health America: www.mentalhealthamerica.net  Anxiety and Depression Association of [place] (ADAA): ProgramCam.dewww.adaa.org  The [employer] Alliance on Mental Illness (NAMI): www.nami.org Contact a health care provider if you:  Have a hard time staying focused or finishing daily tasks.  Spend many hours a day feeling worried about everyday life.  Become exhausted by worry.  Start to have headaches, feel tense, or have nausea.  Urinate more than normal.  Have diarrhea. Get help right away if you have:  A racing heart and shortness of breath.  Thoughts of hurting yourself or others. If you ever feel like you may hurt yourself or others, or have thoughts about taking your own life, get help right away. You can go to your nearest emergency department or call:  Your local emergency services (911 in the [place]  A suicide crisis helpline, such as the National Suicide Prevention Lifeline at (201) 688-84531-(208)006-6901. This is open 24 hours a day. Summary  Taking steps to learn and use tension reduction techniques can help calm you and help prevent triggering an anxiety reaction.  When used together, medicines, psychotherapy, and tension reduction techniques may be the most effective treatment.  Family, friends, and partners can play a big part in helping you recover from an anxiety disorder. This information is not intended to replace advice given to you by your health care provider. Make sure you discuss any questions you have with your health care  provider. Document Revised: 10/18/2018 Document Reviewed: 10/18/2018 Elsevier Patient Education  2021 Elsevier Inc.   Insomnia Insomnia is a sleep disorder that makes it difficult to fall asleep or stay asleep. Insomnia can cause fatigue, low energy, difficulty concentrating, mood swings, and poor performance at work or school. There are three different ways to classify insomnia:  Difficulty falling asleep.  Difficulty staying asleep.  Waking up too early in the morning. Any type of insomnia can be long-term (chronic) or short-term (acute). Both are common. Short-term insomnia usually lasts for three months or less. Chronic insomnia occurs at least three times a week for longer than three months. What are the causes? Insomnia may be caused by another condition, situation, or substance, such as:  Anxiety.  Certain medicines.  Gastroesophageal reflux disease (GERD) or other gastrointestinal conditions.  Asthma or other breathing conditions.  Restless legs syndrome, sleep apnea, or other sleep disorders.  Chronic pain.  Menopause.  Stroke.  Abuse of alcohol, tobacco, or illegal drugs.  Mental health conditions, such as depression.  Caffeine.  Neurological disorders, such as Alzheimer's disease.  An overactive thyroid (hyperthyroidism). Sometimes, the cause of insomnia may not be known. What increases the risk? Risk factors for insomnia include:  Gender. Women are affected more often than men.  Age. Insomnia is more common as you get older.  Stress.  Lack of exercise.  Irregular work schedule or working night shifts.  Traveling between different time zones.  Certain medical and mental health conditions. What are the  signs or symptoms? If you have insomnia, the main symptom is having trouble falling asleep or having trouble staying asleep. This may lead to other symptoms, such as:  Feeling fatigued or having low energy.  Feeling nervous about going to  sleep.  Not feeling rested in the morning.  Having trouble concentrating.  Feeling irritable, anxious, or depressed. How is this diagnosed? This condition may be diagnosed based on:  Your symptoms and medical history. Your health care provider may ask about: ? Your sleep habits. ? Any medical conditions you have. ? Your mental health.  A physical exam. How is this treated? Treatment for insomnia depends on the cause. Treatment may focus on treating an underlying condition that is causing insomnia. Treatment may also include:  Medicines to help you sleep.  Counseling or therapy.  Lifestyle adjustments to help you sleep better. Follow these instructions at home: Eating and drinking  Limit or avoid alcohol, caffeinated beverages, and cigarettes, especially close to bedtime. These can disrupt your sleep.  Do not eat a large meal or eat spicy foods right before bedtime. This can lead to digestive discomfort that can make it hard for you to sleep.   Sleep habits  Keep a sleep diary to help you and your health care provider figure out what could be causing your insomnia. Write down: ? When you sleep. ? When you wake up during the night. ? How well you sleep. ? How rested you feel the next day. ? Any side effects of medicines you are taking. ? What you eat and drink.  Make your bedroom a dark, comfortable place where it is easy to fall asleep. ? Put up shades or blackout curtains to block light from outside. ? Use a white noise machine to block noise. ? Keep the temperature cool.  Limit screen use before bedtime. This includes: ? Watching TV. ? Using your smartphone, tablet, or computer.  Stick to a routine that includes going to bed and waking up at the same times every day and night. This can help you fall asleep faster. Consider making a quiet activity, such as reading, part of your nighttime routine.  Try to avoid taking naps during the day so that you sleep better at  night.  Get out of bed if you are still awake after 15 minutes of trying to sleep. Keep the lights down, but try reading or doing a quiet activity. When you feel sleepy, go back to bed.   General instructions  Take over-the-counter and prescription medicines only as told by your health care provider.  Exercise regularly, as told by your health care provider. Avoid exercise starting several hours before bedtime.  Use relaxation techniques to manage stress. Ask your health care provider to suggest some techniques that may work well for you. These may include: ? Breathing exercises. ? Routines to release muscle tension. ? Visualizing peaceful scenes.  Make sure that you drive carefully. Avoid driving if you feel very sleepy.  Keep all follow-up visits as told by your health care provider. This is important. Contact a health care provider if:  You are tired throughout the day.  You have trouble in your daily routine due to sleepiness.  You continue to have sleep problems, or your sleep problems get worse. Get help right away if:  You have serious thoughts about hurting yourself or someone else. If you ever feel like you may hurt yourself or others, or have thoughts about taking your own life, get help right  away. You can go to your nearest emergency department or call:  Your local emergency services (911 in the [place]  A suicide crisis helpline, such as the National Suicide Prevention Lifeline at (726)805-1477. This is open 24 hours a day. Summary  Insomnia is a sleep disorder that makes it difficult to fall asleep or stay asleep.  Insomnia can be long-term (chronic) or short-term (acute).  Treatment for insomnia depends on the cause. Treatment may focus on treating an underlying condition that is causing insomnia.  Keep a sleep diary to help you and your health care provider figure out what could be causing your insomnia. This information is not intended to replace advice given  to you by your health care provider. Make sure you discuss any questions you have with your health care provider. Document Revised: 03/28/2020 Document Reviewed: 03/28/2020 Elsevier Patient Education  2021 [employer].     If you have lab work done today you will be contacted with your lab results within the next 2 weeks.  If you have not heard from [place] then please contact us. The fastest way to get your results is to register for My Chart.   IF you received an x-ray today, you will receive an invoice from [facility]. Please contact [facility] at [phone] with questions or concerns regarding your invoice.   IF you received labwork today, you will receive an invoice from [place]. Please contact [facility] at [phone] with questions or concerns regarding your invoice.   Our billing staff will not be able to assist you with questions regarding bills from these companies.  You will be contacted with the lab results as soon as they are available. The fastest way to get your results is to activate your My Chart account. Instructions are located on the last page of this paperwork. If you have not heard from [place] regarding the results in 2 weeks, please contact this office.         Signed, Meredith Staggers, MD [facility]

## 2020-07-13 LAB — BASIC METABOLIC PANEL
BUN/Creatinine Ratio: 14 (ref 9–23)
BUN: 10 mg/dL (ref 6–24)
CO2: 22 mmol/L (ref 20–29)
Calcium: 9.4 mg/dL (ref 8.7–10.2)
Chloride: 106 mmol/L (ref 96–106)
Creatinine, Ser: 0.69 mg/dL (ref 0.57–1.00)
GFR calc Af Amer: 117 mL/min/{1.73_m2} (ref >59)
GFR calc non Af Amer: 102 mL/min/{1.73_m2} (ref >59)
Glucose: 86 mg/dL (ref 65–99)
Potassium: 4.4 mmol/L (ref 3.5–5.2)
Sodium: 144 mmol/L (ref 134–144)

## 2020-10-07 ENCOUNTER — Ambulatory Visit: Payer: Managed Care, Other (non HMO) | Admitting: Cardiology

## 2020-10-09 ENCOUNTER — Ambulatory Visit: Payer: Managed Care, Other (non HMO) | Admitting: Family Medicine

## 2020-12-25 ENCOUNTER — Other Ambulatory Visit: Payer: Self-pay | Admitting: Family Medicine

## 2020-12-25 DIAGNOSIS — F411 Generalized anxiety disorder: Secondary | ICD-10-CM

## 2021-01-09 ENCOUNTER — Encounter: Payer: Self-pay | Admitting: Cardiology

## 2021-01-09 ENCOUNTER — Ambulatory Visit: Payer: Managed Care, Other (non HMO) | Admitting: Cardiology

## 2021-01-09 ENCOUNTER — Other Ambulatory Visit: Payer: Self-pay

## 2021-01-09 ENCOUNTER — Telehealth: Payer: Self-pay | Admitting: Cardiology

## 2021-01-09 VITALS — BP 135/78 | HR 62 | Ht 67.0 in

## 2021-01-09 DIAGNOSIS — I10 Essential (primary) hypertension: Secondary | ICD-10-CM | POA: Diagnosis not present

## 2021-01-09 DIAGNOSIS — I491 Atrial premature depolarization: Secondary | ICD-10-CM

## 2021-01-09 MED ORDER — LOSARTAN POTASSIUM 50 MG PO TABS: 50.0000 mg | ORAL_TABLET | Freq: Every day | ORAL | 3 refills | Status: DC

## 2021-01-09 NOTE — Progress Notes (Signed)
Cardiology Office Note:    Date:  01/09/2021   ID:  Brittney Bell, DOB 1970-07-12, MRN [id number]  PCP:  Patient, No Pcp Per (Inactive)  Cardiologist:  Norman Herrlich, MD    Referring MD: No ref. provider found    ASSESSMENT:    1. PAC (premature atrial contraction)   2. APC (atrial premature contractions)   3. Primary hypertension    PLAN:    In order of problems listed above:  Arrhythmias improved no recent episodes no longer on calcium channel blocker Initiate therapy low-dose ARB trend blood pressures bring list in 2 weeks and check renal function at that time   Next appointment: 6 months   Medication Adjustments/Labs and Tests Ordered: Current medicines are reviewed at length with the patient today.  Concerns regarding medicines are outlined above.  No orders of the defined types were placed in this encounter.  No orders of the defined types were placed in this encounter.   Chief Complaint  Patient presents with   Follow-up   Palpitations   Hypertension     History of Present Illness:    Brittney Bell is a 50 y.o. female with a hx of some palpitation with symptomatic APCs and hypertension.  She was last seen 10/04/2019 improved with calcium channel blocker.  Compliance with diet, lifestyle and medications: Yes  She is concerned she sporadically checks blood pressure at home his numbers up to 190 systolic She did not tolerate the calcium channel blockers so that she felt badly Has not had further palpitation Recheck blood pressure by me is 148 /76.  We decided to restart antihypertensive therapy we will use low-dose ARB record home blood pressures daily 2 weeks and bring them to the office and check renal function at that time Echocardiogram 09/27/2019: Left ventricular size and function and no significant valvular abnormality. Cardiac perfusion study performed 10/02/2019 view showed normal perfusion normal function ejection fraction 74% a low risk  test. Past Medical History:  Diagnosis Date   Cellulitis and abscess of face 01/29/2013   Essential hypertension, benign 05/15/2017   Generalized anxiety disorder 05/20/2017   Low back pain    Major depressive disorder     Past Surgical History:  Procedure Laterality Date   BREAST CYST EXCISION Right 03/05/2020   Procedure: Excision of right nipple cyst;  Surgeon: Manus Rudd, MD;  Location: [facility];  Service: General;  Laterality: Right;  lma   TUBAL LIGATION      Current Medications: Current Meds  Medication Sig   diltiazem (CARDIZEM CD) 120 MG 24 hr capsule Take 1 capsule (120 mg total) by mouth daily.   FLUoxetine (PROZAC) 20 MG tablet Take 1 tablet (20 mg total) by mouth daily.     Allergies:   Azithromycin, Tizanidine, and Levaquin [levofloxacin]   Social History   Socioeconomic History   Marital status: Widowed    Spouse name: Not on file   Number of children: Not on file   Years of education: 13   Highest education level: Some college, no degree  Occupational History   Not on file  Tobacco Use   Smoking status: Former    Types: Cigarettes    Quit date: 11/30/2015    Years since quitting: 5.1   Smokeless tobacco: Never  Substance and Sexual Activity   Alcohol use: Yes    Alcohol/week: 0.0 standard drinks    Comment: socially   Drug use: No   Sexual activity: Yes  Other Topics  Concern   Not on file  Social History Narrative   Right handed   Lives alone   Social Determinants of Health   Financial Resource Strain: Not on file  Food Insecurity: Not on file  Transportation Needs: Not on file  Physical Activity: Not on file  Stress: Not on file  Social Connections: Not on file     Family History: The patient's family history includes ADD / ADHD in her daughter; Bipolar disorder in her brother; Dementia in an other family member; Heart disease in her mother; Hypertension in her mother. ROS:   Please see the history of present  illness.    All other systems reviewed and are negative.  EKGs/Labs/Other Studies Reviewed:    The following studies were reviewed today:  EKG:  EKG ordered today and personally reviewed.  The ekg ordered today demonstrates sinus rhythm nonspecific T waves otherwise normal EKG  Recent Labs: 07/12/2020: BUN 10; Creatinine, Ser 0.69; Potassium 4.4; Sodium 144  Recent Lipid Panel No results found for: CHOL, TRIG, HDL, CHOLHDL, VLDL, LDLCALC, LDLDIRECT  Physical Exam:    VS:  BP 135/78 (BP Location: Left Arm, Patient Position: Sitting, Cuff Size: Normal)   Pulse 62   Ht 5\' 7"  (1.702 m)   SpO2 97%   BMI 31.64 kg/m     Wt Readings from Last 3 Encounters:  07/12/20 202 lb (91.6 kg)  04/23/20 197 lb 6.4 oz (89.5 kg)  03/05/20 200 lb 2.8 oz (90.8 kg)     GEN:  Well nourished, well developed in no acute distress HEENT: Normal NECK: No JVD; No carotid bruits LYMPHATICS: No lymphadenopathy CARDIAC: RRR, no murmurs, rubs, gallops RESPIRATORY:  Clear to auscultation without rales, wheezing or rhonchi  ABDOMEN: Soft, non-tender, non-distended MUSCULOSKELETAL:  No edema; No deformity  SKIN: Warm and dry NEUROLOGIC:  Alert and oriented x 3 PSYCHIATRIC:  Normal affect    Signed, 05/05/20, MD  01/09/2021 4:38 PM    [facility]

## 2021-01-09 NOTE — Patient Instructions (Addendum)
Medication Instructions:  Your physician has recommended you make the following change in your medication:  START: Cozaar (Losartan) 50 mg once daily Check your blood pressure daily, record and drop the readings by the office.  *If you need a refill on your cardiac medications before your next appointment, please call your pharmacy*   Lab Work: Your physician recommends that you return for lab work in:  In 2 weeks:  BMP If you have labs (blood work) drawn today and your tests are completely normal, you will receive your results only by: MyChart Message (if you have MyChart) OR A paper copy in the mail If you have any lab test that is abnormal or we need to change your treatment, we will call you to review the results.   Testing/Procedures: None   Follow-Up: At [facility], you and your health needs are our priority.  As part of our continuing mission to provide you with exceptional heart care, we have created designated Provider Care Teams.  These Care Teams include your primary Cardiologist (physician) and Advanced Practice Providers (APPs -  Physician Assistants and Nurse Practitioners) who all work together to provide you with the care you need, when you need it.  We recommend signing up for the patient portal called "MyChart".  Sign up information is provided on this After Visit Summary.  MyChart is used to connect with patients for Virtual Visits (Telemedicine).  Patients are able to view lab/test results, encounter notes, upcoming appointments, etc.  Non-urgent messages can be sent to your provider as well.   To learn more about what you can do with MyChart, go to [email or web address].    Your next appointment:   6 month(s)  The format for your next appointment:   In Person  Provider:   Norman Herrlich, MD   Other Instructions

## 2021-01-09 NOTE — Telephone Encounter (Signed)
No ear pods found in lobby or exam room patient was in, patient made aware.

## 2021-01-09 NOTE — Telephone Encounter (Signed)
Brittney Bell is calling stating she believes she left her airpods earbuds in the case either in the waiting room or exam room she was seen in today. Please advise.

## 2021-01-29 ENCOUNTER — Other Ambulatory Visit: Payer: Self-pay | Admitting: Family Medicine

## 2021-01-29 ENCOUNTER — Telehealth: Payer: Self-pay

## 2021-01-29 DIAGNOSIS — F411 Generalized anxiety disorder: Secondary | ICD-10-CM

## 2021-01-29 NOTE — Telephone Encounter (Signed)
Pt needs refill on FLUoxetine (PROZAC) 20 MG tablet   [facility] [place], [place] - [address]   Pt has med check appt 09/09  Pt call back [phone]

## 2021-01-30 ENCOUNTER — Other Ambulatory Visit: Payer: Self-pay

## 2021-01-30 DIAGNOSIS — F411 Generalized anxiety disorder: Secondary | ICD-10-CM

## 2021-01-30 MED ORDER — FLUOXETINE HCL 20 MG PO TABS: 20.0000 mg | ORAL_TABLET | Freq: Every day | ORAL | 3 refills | Status: DC

## 2021-01-30 NOTE — Telephone Encounter (Signed)
Medication sent to pharmacy  

## 2021-02-07 ENCOUNTER — Ambulatory Visit: Payer: Managed Care, Other (non HMO) | Admitting: Registered Nurse

## 2021-02-07 ENCOUNTER — Encounter: Payer: Self-pay | Admitting: Registered Nurse

## 2021-02-07 ENCOUNTER — Other Ambulatory Visit: Payer: Self-pay

## 2021-02-07 VITALS — BP 120/80 | HR 109 | Temp 97.7°F | Resp 17 | Wt 209.0 lb

## 2021-02-07 DIAGNOSIS — Z6832 Body mass index (BMI) 32.0-32.9, adult: Secondary | ICD-10-CM | POA: Diagnosis not present

## 2021-02-07 DIAGNOSIS — Z8679 Personal history of other diseases of the circulatory system: Secondary | ICD-10-CM

## 2021-02-07 DIAGNOSIS — Z13220 Encounter for screening for lipoid disorders: Secondary | ICD-10-CM

## 2021-02-07 DIAGNOSIS — Z130 Encounter for screening for diseases of the blood and blood-forming organs and certain disorders involving the immune mechanism: Secondary | ICD-10-CM | POA: Diagnosis not present

## 2021-02-07 DIAGNOSIS — Z13228 Encounter for screening for other metabolic disorders: Secondary | ICD-10-CM

## 2021-02-07 DIAGNOSIS — F411 Generalized anxiety disorder: Secondary | ICD-10-CM

## 2021-02-07 DIAGNOSIS — Z1329 Encounter for screening for other suspected endocrine disorder: Secondary | ICD-10-CM

## 2021-02-07 LAB — COMPREHENSIVE METABOLIC PANEL
ALT: 16 U/L (ref 0–35)
AST: 17 U/L (ref 0–37)
Albumin: 4.4 g/dL (ref 3.5–5.2)
Alkaline Phosphatase: 77 U/L (ref 39–117)
BUN: 7 mg/dL (ref 6–23)
CO2: 25 mEq/L (ref 19–32)
Calcium: 9.4 mg/dL (ref 8.4–10.5)
Chloride: 103 mEq/L (ref 96–112)
Creatinine, Ser: 0.74 mg/dL (ref 0.40–1.20)
GFR: 93.72 mL/min (ref >60.00)
Glucose, Bld: 76 mg/dL (ref 70–99)
Potassium: 4.3 mEq/L (ref 3.5–5.1)
Sodium: 137 mEq/L (ref 135–145)
Total Bilirubin: 0.7 mg/dL (ref 0.2–1.2)
Total Protein: 7.6 g/dL (ref 6.0–8.3)

## 2021-02-07 LAB — CBC WITH DIFFERENTIAL/PLATELET
Basophils Absolute: 0.1 10*3/uL (ref 0.0–0.1)
Basophils Relative: 1 % (ref 0.0–3.0)
Eosinophils Absolute: 0.1 10*3/uL (ref 0.0–0.7)
Eosinophils Relative: 1.7 % (ref 0.0–5.0)
HCT: 37.6 % (ref 36.0–46.0)
Hemoglobin: 12.2 g/dL (ref 12.0–15.0)
Lymphocytes Relative: 35.3 % (ref 12.0–46.0)
Lymphs Abs: 2.4 10*3/uL (ref 0.7–4.0)
MCHC: 32.4 g/dL (ref 30.0–36.0)
MCV: 87.5 fl (ref 78.0–100.0)
Monocytes Absolute: 0.6 10*3/uL (ref 0.1–1.0)
Monocytes Relative: 8.5 % (ref 3.0–12.0)
Neutro Abs: 3.6 10*3/uL (ref 1.4–7.7)
Neutrophils Relative %: 53.5 % (ref 43.0–77.0)
Platelets: 317 10*3/uL (ref 150.0–400.0)
RBC: 4.3 Mil/uL (ref 3.87–5.11)
RDW: 14.5 % (ref 11.5–15.5)
WBC: 6.7 10*3/uL (ref 4.0–10.5)

## 2021-02-07 LAB — LIPID PANEL
Cholesterol: 176 mg/dL (ref 0–200)
HDL: 69.1 mg/dL (ref >39.00)
LDL Cholesterol: 92 mg/dL (ref 0–99)
NonHDL: 106.89
Total CHOL/HDL Ratio: 3
Triglycerides: 74 mg/dL (ref 0.0–149.0)
VLDL: 14.8 mg/dL (ref 0.0–40.0)

## 2021-02-07 LAB — HEMOGLOBIN A1C: Hgb A1c MFr Bld: 5.4 % (ref 4.6–6.5)

## 2021-02-07 LAB — TSH: TSH: 2.62 u[IU]/mL (ref 0.35–5.50)

## 2021-02-07 MED ORDER — CHLORTHALIDONE 15 MG PO TABS: 15.0000 mg | ORAL_TABLET | Freq: Every day | ORAL | 0 refills | Status: DC

## 2021-02-07 MED ORDER — FLUOXETINE HCL 40 MG PO CAPS: 40.0000 mg | ORAL_CAPSULE | Freq: Every day | ORAL | 3 refills | Status: DC

## 2021-02-07 MED ORDER — LOSARTAN POTASSIUM 50 MG PO TABS: 25.0000 mg | ORAL_TABLET | Freq: Every day | ORAL | 0 refills | Status: DC

## 2021-02-07 MED ORDER — SEMAGLUTIDE(0.25 OR 0.5MG/DOS) 2 MG/1.5ML ~~LOC~~ SOPN: 0.5000 mg | PEN_INJECTOR | SUBCUTANEOUS | 0 refills | Status: DC

## 2021-02-07 NOTE — Progress Notes (Signed)
Established Patient Office Visit  Subjective:  Patient ID: Brittney Bell, female    DOB: 1971/05/09  Age: 50 y.o. MRN: [id number]  CC:  Chief Complaint  Patient presents with   Medication Refill    Needing refill of prozac    Hypertension    Patient needs to discuss medication, she is also having some swelling, worse at night. Finger indention stays on legs for a few seconds after    HPI Brittney Bell presents for med check  Hypertension: Patient Currently taking: losartan 50mg  po qd Good effect. No AEs. Denies CV symptoms including: chest pain, shob, doe, headache, visual changes, fatigue, claudication Does endorse dependent edema. Pitting. Worse at night. Better in mornings. No pain or redness.    Previous readings and labs: BP Readings from Last 3 Encounters:  02/07/21 120/80  01/09/21 135/78  07/12/20 134/78   Lab Results  Component Value Date   CREATININE 0.69 07/12/2020    GAD Fluoxetine 20mg  po qd Poor relationship with mother, more anxiety lately Hopes to continue, perhaps at higher dose  BMI 32. No acute concerns - but would like to discuss therapies for this.   Past Medical History:  Diagnosis Date   Cellulitis and abscess of face 01/29/2013   Essential hypertension, benign 05/15/2017   Generalized anxiety disorder 05/20/2017   Low back pain    Major depressive disorder     Past Surgical History:  Procedure Laterality Date   BREAST CYST EXCISION Right 03/05/2020   Procedure: Excision of right nipple cyst;  Surgeon: 05/22/2017, MD;  Location: [facility];  Service: General;  Laterality: Right;  lma   TUBAL LIGATION      Family History  Problem Relation Age of Onset   Heart disease Mother    Hypertension Mother    Bipolar disorder Brother    ADD / ADHD Daughter    Dementia Other        Grandmother's sister    Social History   Socioeconomic History   Marital status: Widowed    Spouse name: Not on file   Number  of children: Not on file   Years of education: 13   Highest education level: Some college, no degree  Occupational History   Not on file  Tobacco Use   Smoking status: Former    Types: Cigarettes    Quit date: 11/30/2015    Years since quitting: 5.1   Smokeless tobacco: Never  Substance and Sexual Activity   Alcohol use: Yes    Alcohol/week: 0.0 standard drinks    Comment: socially   Drug use: No   Sexual activity: Yes  Other Topics Concern   Not on file  Social History Narrative   Right handed   Lives alone   Social Determinants of Health   Financial Resource Strain: Not on file  Food Insecurity: Not on file  Transportation Needs: Not on file  Physical Activity: Not on file  Stress: Not on file  Social Connections: Not on file  Intimate Partner Violence: Not on file    Outpatient Medications Prior to Visit  Medication Sig Dispense Refill   FLUoxetine (PROZAC) 20 MG tablet Take 1 tablet (20 mg total) by mouth daily. 30 tablet 3   losartan (COZAAR) 50 MG tablet Take 1 tablet (50 mg total) by mouth daily. (Patient not taking: Reported on 02/07/2021) 90 tablet 3   diltiazem (CARDIZEM CD) 120 MG 24 hr capsule Take 1 capsule (120 mg total) by  mouth daily. 90 capsule 1   No facility-administered medications prior to visit.    Allergies  Allergen Reactions   Azithromycin Hives   Tizanidine Other (See Comments)    Hypotension, severe 80/50s   Levaquin [Levofloxacin] Palpitations    ROS Review of Systems  Constitutional: Negative.   HENT: Negative.    Eyes: Negative.   Respiratory: Negative.    Cardiovascular: Negative.   Gastrointestinal: Negative.   Genitourinary: Negative.   Musculoskeletal: Negative.   Skin: Negative.   Neurological: Negative.   Psychiatric/Behavioral: Negative.    All other systems reviewed and are negative.    Objective:    Physical Exam Vitals and nursing note reviewed.  Constitutional:      General: She is not in acute distress.     Appearance: Normal appearance. She is normal weight. She is not ill-appearing, toxic-appearing or diaphoretic.  Cardiovascular:     Rate and Rhythm: Normal rate and regular rhythm.     Heart sounds: Normal heart sounds. No murmur heard.   No friction rub. No gallop.  Pulmonary:     Effort: Pulmonary effort is normal. No respiratory distress.     Breath sounds: Normal breath sounds. No stridor. No wheezing, rhonchi or rales.  Chest:     Chest wall: No tenderness.  Musculoskeletal:     Right lower leg: Edema (mild) present.     Left lower leg: Edema (mild) present.  Skin:    General: Skin is warm and dry.  Neurological:     General: No focal deficit present.     Mental Status: She is alert and oriented to person, place, and time. Mental status is at baseline.  Psychiatric:        Mood and Affect: Mood normal.        Behavior: Behavior normal.        Thought Content: Thought content normal.        Judgment: Judgment normal.    BP 120/80   Pulse (!) 109   Temp 97.7 F (36.5 C) (Temporal)   Resp 17   Wt 209 lb (94.8 kg)   SpO2 98%   BMI 32.73 kg/m  Wt Readings from Last 3 Encounters:  02/07/21 209 lb (94.8 kg)  07/12/20 202 lb (91.6 kg)  04/23/20 197 lb 6.4 oz (89.5 kg)     There are no preventive care reminders to display for this patient.  There are no preventive care reminders to display for this patient.  Lab Results  Component Value Date   TSH 2.160 01/02/2020   Lab Results  Component Value Date   WBC 6.0 01/02/2020   HGB 11.1 01/02/2020   HCT 35.4 01/02/2020   MCV 78.9 01/02/2020   PLT 376 09/24/2019   Lab Results  Component Value Date   NA 144 07/12/2020   K 4.4 07/12/2020   CO2 22 07/12/2020   GLUCOSE 86 07/12/2020   BUN 10 07/12/2020   CREATININE 0.69 07/12/2020   BILITOT 0.4 01/02/2020   ALKPHOS 94 01/02/2020   AST 12 01/02/2020   ALT 8 01/02/2020   PROT 7.9 01/02/2020   ALBUMIN 4.8 01/02/2020   CALCIUM 9.4 07/12/2020   ANIONGAP 9  09/24/2019   No results found for: CHOL No results found for: HDL No results found for: LDLCALC No results found for: TRIG No results found for: [facility] Lab Results  Component Value Date   HGBA1C 5.3 01/02/2020      Assessment & Plan:   Problem List Items  Addressed This Visit   None   No orders of the defined types were placed in this encounter.   Follow-up: No follow-ups on file.   PLAN Decrease losartan to 25mg  po qd. Start chlorthalidone 15mg  po qd. Check home BP. Return in 3 mo for med check Increase fluoxetine to 40mg  po qd for anxiety Start semaglutide 0.5mg  subq weekly if covered by ins for BMI 32 Return in 3 mo for med check Labs collected. Will follow up with the patient as warranted. Patient encouraged to call clinic with any questions, comments, or concerns.  , NP

## 2021-02-07 NOTE — Patient Instructions (Addendum)
Ms. Brittney Bell to see you  In brief: Increase fluoxetine to 40mg  daily Decrease losartan to 25mg  daily - half tablet Start chlorthalidone 15 mg daily  Will see if we can get semaglutide covered - I'll keep you in the loop - may take a few weeks  Let's plan on 3 mo follow up with Dr.   Thanks,  

## 2021-02-24 ENCOUNTER — Encounter: Payer: Self-pay | Admitting: Registered Nurse

## 2021-02-25 ENCOUNTER — Telehealth: Payer: Self-pay | Admitting: Family Medicine

## 2021-02-25 ENCOUNTER — Telehealth: Payer: Self-pay

## 2021-02-25 NOTE — Telephone Encounter (Signed)
Called and spoke with patient and informed her that a PA is being processed and we will update her on approval /denial. Pt understood.

## 2021-02-25 NOTE — Telephone Encounter (Signed)
Patient states that she still have not received her fluid pill and that the pharmacy is stating that they need a prior authorization.  Please call patient and let her know what she needs to do.

## 2021-02-25 NOTE — Telephone Encounter (Signed)
Patient Prior Authorization was sent to her Plan.

## 2021-02-26 NOTE — Telephone Encounter (Signed)
Status UPDATE:  Pt called and I have informed her that prior authorization with her insurance is what we are waiting on once we have approval the prescription will be sent to the pharmacy. Pt stated that she understood and that she would call the insurance company to move along the authorization.

## 2021-02-26 NOTE — Telephone Encounter (Signed)
Noted  

## 2021-05-13 ENCOUNTER — Other Ambulatory Visit: Payer: Self-pay | Admitting: Registered Nurse

## 2021-05-13 DIAGNOSIS — Z6832 Body mass index (BMI) 32.0-32.9, adult: Secondary | ICD-10-CM

## 2021-05-13 NOTE — Telephone Encounter (Signed)
Refilled. Has follow up scheduled.

## 2021-05-13 NOTE — Telephone Encounter (Signed)
Defer to you on this

## 2021-05-16 ENCOUNTER — Encounter: Payer: Self-pay | Admitting: Family Medicine

## 2021-05-16 ENCOUNTER — Ambulatory Visit: Payer: Managed Care, Other (non HMO) | Admitting: Family Medicine

## 2021-05-16 VITALS — BP 122/78 | HR 61 | Temp 98.4°F | Ht 67.0 in | Wt 191.0 lb

## 2021-05-16 DIAGNOSIS — F411 Generalized anxiety disorder: Secondary | ICD-10-CM | POA: Diagnosis not present

## 2021-05-16 DIAGNOSIS — Z8679 Personal history of other diseases of the circulatory system: Secondary | ICD-10-CM

## 2021-05-16 DIAGNOSIS — Z23 Encounter for immunization: Secondary | ICD-10-CM | POA: Diagnosis not present

## 2021-05-16 NOTE — Progress Notes (Signed)
Subjective:  Patient ID: Brittney Bell, female    DOB: Jul 20, 1970  Age: 50 y.o. MRN: [id number]  CC:  Chief Complaint  Patient presents with   Follow-up    HTN    HPI Brittney Bell presents for  Follow-up.  Last seen in February for transition of care.   Hypertension: With palpitations, PAC's Prior Cardizem CD 120 mg daily, no med since previous year.  She did see cardiology in May of last year with improved palpitations.  Did not feel well on diltiazem.  Occasional palpitations in February but noted more when anxious.  Blood pressure okay at 134/78 at that time.  Previous hypokalemia.  Most recent labs in September normal - saw Brittney Bell in September. Started on chlorthalidone - not covered. Taking losartan 50mg  qd (option of half pill - taking full pill without new side effects). Normal CBC, CMP, lipids, tsh, A1c in September.  Home readings 120/80.  Feel swell at night at times, better in am.   Lab Results  Component Value Date   NA 137 02/07/2021   K 4.3 02/07/2021   CL 103 02/07/2021   CO2 25 02/07/2021   Home readings: BP Readings from Last 3 Encounters:  05/16/21 122/78  02/07/21 120/80  01/09/21 135/78   Lab Results  Component Value Date   CREATININE 0.74 02/07/2021   Generalized anxiety disorder Discussed in February.  Some initial improvement on 10 mg fluoxetine, then recurrence of anxiety, difficulty with sleep.  Increased to 20 mg with option of melatonin or Tylenol PM as well as sleep hygiene discussed.  25-month follow-up was recommended. Saw Brittney Bell in September. Sleeping better with higher dose of fluoxetine in am (40mg ). Focusing better on higher dose of prozac.  Work is swamped.   Depression screen [facility] 2/9 05/16/2021 02/07/2021 07/12/2020 04/23/2020 03/08/2018  Decreased Interest 0 0 0 0 0  Down, Depressed, Hopeless 1 0 0 0 0  PHQ - 2 Score 1 0 0 0 0  Altered sleeping 1 2 - - -  Tired, decreased energy 0 2 - - -  Change in appetite 0 0 - - -   Feeling bad or failure about yourself  0 0 - - -  Trouble concentrating 0 0 - - -  Moving slowly or fidgety/restless 0 0 - - -  Suicidal thoughts 0 0 - - -  PHQ-9 Score 2 4 - - -  Difficult doing work/chores Not difficult at all Not difficult at all - - -   Obesity: Started on semaglutide in September. Has been helping. Has made diet changes - cooking more at home, less processed foods. No abd pain/n/v, no pancreatitis, no FH of MEN syndrome. Would like to continue same.  Lost 18#.  Wt Readings from Last 3 Encounters:  05/16/21 191 lb (86.6 kg)  02/07/21 209 lb (94.8 kg)  07/12/20 202 lb (91.6 kg)  Body mass index is 29.91 kg/m.   Health maintenance Shingles vaccine discussed. COVID-vaccine - primary series in May and June 2021. No booster. Had Covid in October.  Flu vaccine - today.   Immunization History  Administered Date(s) Administered   Influenza,inj,Quad PF,6+ Mos 05/15/2017   Influenza-Unspecified 03/04/2018, 03/14/2020, 03/27/2020   Moderna Sars-Covid-2 Vaccination 10/04/2019, 11/01/2019     History Patient Active Problem List   Diagnosis Date Noted   Major depressive disorder    Low back pain    Generalized anxiety disorder 05/20/2017   Essential hypertension, benign 05/15/2017   Past Medical History:  Diagnosis Date   Cellulitis and abscess of face 01/29/2013   Essential hypertension, benign 05/15/2017   Generalized anxiety disorder 05/20/2017   Low back pain    Major depressive disorder    Past Surgical History:  Procedure Laterality Date   BREAST CYST EXCISION Right 03/05/2020   Procedure: Excision of right nipple cyst;  Surgeon: Brittney Rudd, MD;  Location: [facility];  Service: General;  Laterality: Right;  lma   TUBAL LIGATION     Allergies  Allergen Reactions   Azithromycin Hives   Tizanidine Other (See Comments)    Hypotension, severe 80/50s   Levaquin [Levofloxacin] Palpitations   Prior to Admission medications    Medication Sig Start Date End Date Taking? Authorizing Provider  FLUoxetine (PROZAC) 20 MG tablet Take 1 tablet (20 mg total) by mouth daily. 01/30/21  Yes Brittney Flood, MD  FLUoxetine (PROZAC) 40 MG capsule Take 1 capsule (40 mg total) by mouth daily. 02/07/21  Yes Brittney Agee, NP  losartan (COZAAR) 50 MG tablet Take 0.5 tablets (25 mg total) by mouth daily. 02/07/21  Yes Brittney Agee, NP  Semaglutide,0.25 or 0.5MG /DOS, (OZEMPIC, 0.25 OR 0.5 MG/DOSE,) 2 MG/1.5ML SOPN INJECT 0.5MG  ONCE A WEEK 05/13/21  Yes Brittney Flood, MD   Social History   Socioeconomic History   Marital status: Widowed    Spouse name: Not on file   Number of children: Not on file   Years of education: 13   Highest education level: Some college, no degree  Occupational History   Not on file  Tobacco Use   Smoking status: Former    Types: Cigarettes    Quit date: 11/30/2015    Years since quitting: 5.4   Smokeless tobacco: Never  Substance and Sexual Activity   Alcohol use: Yes    Alcohol/week: 0.0 standard drinks    Comment: socially   Drug use: No   Sexual activity: Yes  Other Topics Concern   Not on file  Social History Narrative   Right handed   Lives alone   Social Determinants of Health   Financial Resource Strain: Not on file  Food Insecurity: Not on file  Transportation Needs: Not on file  Physical Activity: Not on file  Stress: Not on file  Social Connections: Not on file  Intimate Partner Violence: Not on file    Review of Systems  Constitutional:  Negative for fatigue and unexpected weight change.  Respiratory:  Negative for chest tightness and shortness of breath.   Cardiovascular:  Positive for leg swelling (at night). Negative for chest pain and palpitations.  Gastrointestinal:  Negative for abdominal pain and blood in stool.  Neurological:  Negative for dizziness, syncope, light-headedness and headaches.    Objective:   Vitals:   05/16/21 1047  BP: 122/78  Pulse: 61   Temp: 98.4 F (36.9 C)  TempSrc: Temporal  SpO2: 96%  Weight: 191 lb (86.6 kg)  Height: 5\' 7"  (1.702 m)    Physical Exam Vitals reviewed.  Constitutional:      Appearance: Normal appearance. She is well-developed.  HENT:     Head: Normocephalic and atraumatic.  Eyes:     Conjunctiva/sclera: Conjunctivae normal.     Pupils: Pupils are equal, round, and reactive to light.  Neck:     Vascular: No carotid bruit.  Cardiovascular:     Rate and Rhythm: Normal rate and regular rhythm.     Heart sounds: Normal heart sounds.  Pulmonary:     Effort:  Pulmonary effort is normal.     Breath sounds: Normal breath sounds.  Abdominal:     Palpations: Abdomen is soft. There is no pulsatile mass.     Tenderness: There is no abdominal tenderness.  Musculoskeletal:     Right lower leg: No edema.     Left lower leg: No edema.  Skin:    General: Skin is warm and dry.  Neurological:     Mental Status: She is alert and oriented to person, place, and time.  Psychiatric:        Mood and Affect: Mood normal.        Behavior: Behavior normal.     Assessment & Plan:  Brittney Bell is a 50 y.o. female . History of hypertension  -Commended on weight loss.  Blood pressure overall stable, no significant increase in palpitations.  Hold on new medications for now.  Continue same dose losartan 50 mg.  Ozempic continued as no new side effects and has been helpful for weight loss.  Generalized anxiety disorder  -Improved at current dose of fluoxetine.  Continue same.  Flu vaccine need - Plan: Flu Vaccine QUAD 65mo+IM (Fluarix, Fluzone & Alfiuria Quad PF).  Bivalent COVID booster recommended in January given recent COVID infection.   No orders of the defined types were placed in this encounter.  Patient Instructions  Flu vaccine today.  I do recommend Covid bivalent booster in January.  No med changes today - congrats on the weight loss! Thanks for coming in today!    Signed,   Brittney Staggers, MD [facility], [facility] 05/16/21 11:48 AM

## 2021-05-16 NOTE — Patient Instructions (Signed)
Flu vaccine today.  I do recommend Covid bivalent booster in January.  No med changes today - congrats on the weight loss! Thanks for coming in today!

## 2021-06-09 ENCOUNTER — Other Ambulatory Visit: Payer: Self-pay | Admitting: Family Medicine

## 2021-06-26 ENCOUNTER — Ambulatory Visit: Payer: Managed Care, Other (non HMO) | Admitting: Cardiology

## 2021-07-28 ENCOUNTER — Telehealth: Payer: Self-pay | Admitting: Family Medicine

## 2021-07-28 DIAGNOSIS — Z6832 Body mass index (BMI) 32.0-32.9, adult: Secondary | ICD-10-CM

## 2021-07-28 MED ORDER — WEGOVY 0.5 MG/0.5ML ~~LOC~~ SOAJ: 0.5000 mg | SUBCUTANEOUS | 1 refills | Status: DC

## 2021-07-28 NOTE — Telephone Encounter (Signed)
Should pt try an alternative? Or should we try appeal (30 day processing)

## 2021-07-28 NOTE — Telephone Encounter (Signed)
We can try Wegovy, I have sent that to her pharmacy but if that is not covered can proceed with appeal.

## 2021-07-28 NOTE — Telephone Encounter (Signed)
Pt called in stating that the ozempic was denied she got a letter in the mail over the weekend that was dated Jan. She wanted to know if the appeal process has been started?  She states that she is open to trying [facility]   Please advise

## 2021-08-01 ENCOUNTER — Telehealth: Payer: Self-pay

## 2021-08-01 NOTE — Telephone Encounter (Signed)
Caller name:Brittney Bell  ? ?On DPR? :Yes ? ?Call back [phone] ? ?Provider they see: Neva Seat ? ?Reason for call:Pt received a letter in the mail today regarding Ozempic that it was denied back in January and looks like on Medication list there was Semaglutide-Weight Management (WEGOVY) 0.5 MG/0.5ML SOAJ  pt insurance does not cover this. Pt talked to CMA last week said they could appeal for the Ozempic? Pt said that the Ozempic is the only one that was woking  ? ?

## 2021-08-01 NOTE — Telephone Encounter (Signed)
Called the insurance company to complete an appeal we must compose a free form letter stating the rational for why she should get an exception to receive the [place]. She notes that she has lost 30 lb since starting this medication no previous medications tried at this time. We will also have to attach the PA denial letter and faxed to [phone] ?

## 2021-08-01 NOTE — Telephone Encounter (Signed)
I know you were handling this matter already. Please advise ?

## 2021-08-04 ENCOUNTER — Encounter: Payer: Self-pay | Admitting: Family Medicine

## 2021-08-04 NOTE — Telephone Encounter (Signed)
Letter completed.

## 2021-08-04 NOTE — Telephone Encounter (Signed)
Letter faxed to required number on PA denial letter. Placed in [place] folder on my desk  ?

## 2021-08-04 NOTE — Telephone Encounter (Signed)
I have pended free form letter in regaurds to [place], please review. This is to appeal [place] denial of coverage for Ozempic with weight loss DX, pt has requested we move forward with appeal at this time  ? ?If you wish to review denial letter I have this hard copy in my possession as it must be sent with appeal letter  ?

## 2021-08-04 NOTE — Telephone Encounter (Signed)
Called LM stating we have sent letter to insurance  ?

## 2021-08-05 NOTE — Telephone Encounter (Signed)
Cess from [facility] called back and give a phone number so that you can call and give a verbal authorization. The number is [phone] option 2 to give a verbal  ?

## 2021-08-05 NOTE — Telephone Encounter (Signed)
Called insurance no verbal needed. Customer service rep stated to disregard message about verbal  ?

## 2021-08-14 ENCOUNTER — Telehealth: Payer: Self-pay

## 2021-08-14 NOTE — Telephone Encounter (Signed)
Medication was still denied, this was an appeal that was submitted several weeks ago with letter stating rational. Please advise action from here given medications are not covered for weight loss at this time  ?

## 2021-08-14 NOTE — Telephone Encounter (Signed)
Caller name:Devone Rod  ? ?On DPR? :Yes ? ?Call back [phone] ? ?Provider they see: Neva Seat ? ?Reason for call:[employer] called and pt pro authorization was denied and pt [employer] called said they need they need appeal done on insurance  ? ?

## 2021-08-15 NOTE — Telephone Encounter (Signed)
Unfortunately if medication not covered after appeal I am not sure of other alternatives.  Diet changes, other positive changes since starting that medication will also help maintain weight loss, but let me know if she would like to meet with a weight loss specialist.  Let me know if there are questions. ?

## 2021-08-15 NOTE — Telephone Encounter (Signed)
I am sorry that it was not covered.  I thought the letter that we provided would potentially help make a case to continue that medication.  I am not aware of a peer to peer request but I am happy to talk to someone with insurance for coverage to see if that would help.  Do we have a specific number or case for a peer to peer? ?

## 2021-08-15 NOTE — Telephone Encounter (Signed)
When I called the insurance back they requested office notes that were faxed to them and they stated to me that no Peer to peer was required at this time so I am concerned there is inconsistent or inaccurate information being shared with [place]  ?

## 2021-08-15 NOTE — Telephone Encounter (Signed)
Called pt to inform of determination pt was very upset as she expressed that she does not believe we did everything required and have thus dropped the ball on the appeal process therfor causing the denial of the medication, I did explain to the patient that because this medication is for a diagnosis of type 2 diabetes this medication is very seldom covered byt insurance for other use. Patient states Christella Scheuermann called our office yesterday requesting peer to peer but in the documentation this states notice of denial of appeal not a request for peer to peer. Pt has expressed she will be searching for a new PCP and new insurance I stated pt was free to do so.  ? ?Did also mention referral to weight management clinic and pt stated she has been on Ozempic for 6 months she cannot simply stop medication.  ?

## 2021-09-18 ENCOUNTER — Ambulatory Visit: Payer: Managed Care, Other (non HMO) | Admitting: Cardiology

## 2021-09-18 NOTE — Progress Notes (Deleted)
Cardiology Office Note:    Date:  09/18/2021   ID:  RICCI PAFF, DOB Sep 17, 1970, MRN [id number]  PCP:  Shade Flood, MD  Cardiologist:  Norman Herrlich, MD    Referring MD: Shade Flood, MD    ASSESSMENT:    No diagnosis found. PLAN:    In order of problems listed above:  ***   Next appointment: ***   Medication Adjustments/Labs and Tests Ordered: Current medicines are reviewed at length with the patient today.  Concerns regarding medicines are outlined above.  No orders of the defined types were placed in this encounter.  No orders of the defined types were placed in this encounter.   No chief complaint on file.   History of Present Illness:    Brittney Bell is a 51 y.o. female with a hx of palpitation with symptomatic APCs and hypertension  last seen 01/09/2021.  Echocardiogram 09/27/2019: Left ventricular size and function and no significant valvular abnormality. Cardiac perfusion study performed 10/02/2019 view showed normal perfusion normal function ejection fraction 74% a low risk test. Compliance with diet, lifestyle and medications: *** Past Medical History:  Diagnosis Date   Cellulitis and abscess of face 01/29/2013   Essential hypertension, benign 05/15/2017   Generalized anxiety disorder 05/20/2017   Low back pain    Major depressive disorder     Past Surgical History:  Procedure Laterality Date   BREAST CYST EXCISION Right 03/05/2020   Procedure: Excision of right nipple cyst;  Surgeon: Manus Rudd, MD;  Location: [facility];  Service: General;  Laterality: Right;  lma   TUBAL LIGATION      Current Medications: No outpatient medications have been marked as taking for the 09/18/21 encounter (Appointment) with Baldo Daub, MD.     Allergies:   Azithromycin, Tizanidine, and Levaquin [levofloxacin]   Social History   Socioeconomic History   Marital status: Widowed    Spouse name: Not on file   Number of  children: Not on file   Years of education: 13   Highest education level: Some college, no degree  Occupational History   Not on file  Tobacco Use   Smoking status: Former    Types: Cigarettes    Quit date: 11/30/2015    Years since quitting: 5.8   Smokeless tobacco: Never  Substance and Sexual Activity   Alcohol use: Yes    Alcohol/week: 0.0 standard drinks    Comment: socially   Drug use: No   Sexual activity: Yes  Other Topics Concern   Not on file  Social History Narrative   Right handed   Lives alone   Social Determinants of Health   Financial Resource Strain: Not on file  Food Insecurity: Not on file  Transportation Needs: Not on file  Physical Activity: Not on file  Stress: Not on file  Social Connections: Not on file     Family History: The patient's ***family history includes ADD / ADHD in her daughter; Bipolar disorder in her brother; Dementia in an other family member; Heart disease in her mother; Hypertension in her mother. ROS:   Please see the history of present illness.    All other systems reviewed and are negative.  EKGs/Labs/Other Studies Reviewed:    The following studies were reviewed today:  EKG:  EKG ordered today and personally reviewed.  The ekg ordered today demonstrates ***  Recent Labs: 02/07/2021: ALT 16; BUN 7; Creatinine, Ser 0.74; Hemoglobin 12.2; Platelets 317.0; Potassium 4.3; Sodium  137; TSH 2.62  Recent Lipid Panel    Component Value Date/Time   CHOL 176 02/07/2021 1135   TRIG 74.0 02/07/2021 1135   HDL 69.10 02/07/2021 1135   CHOLHDL 3 02/07/2021 1135   VLDL 14.8 02/07/2021 1135   LDLCALC 92 02/07/2021 1135    Physical Exam:    VS:  There were no vitals taken for this visit.    Wt Readings from Last 3 Encounters:  05/16/21 191 lb (86.6 kg)  02/07/21 209 lb (94.8 kg)  07/12/20 202 lb (91.6 kg)     GEN: *** Well nourished, well developed in no acute distress HEENT: Normal NECK: No JVD; No carotid bruits LYMPHATICS:  No lymphadenopathy CARDIAC: ***RRR, no murmurs, rubs, gallops RESPIRATORY:  Clear to auscultation without rales, wheezing or rhonchi  ABDOMEN: Soft, non-tender, non-distended MUSCULOSKELETAL:  No edema; No deformity  SKIN: Warm and dry NEUROLOGIC:  Alert and oriented x 3 PSYCHIATRIC:  Normal affect    Signed, Norman Herrlich, MD  09/18/2021 7:13 AM    [facility]

## 2021-12-10 ENCOUNTER — Encounter: Payer: Managed Care, Other (non HMO) | Admitting: Family Medicine

## 2022-01-29 DIAGNOSIS — F419 Anxiety disorder, unspecified: Secondary | ICD-10-CM | POA: Insufficient documentation

## 2022-01-29 DIAGNOSIS — I10 Essential (primary) hypertension: Secondary | ICD-10-CM | POA: Insufficient documentation

## 2022-02-11 ENCOUNTER — Other Ambulatory Visit: Payer: Self-pay

## 2022-02-11 DIAGNOSIS — Z8679 Personal history of other diseases of the circulatory system: Secondary | ICD-10-CM

## 2022-02-11 MED ORDER — LOSARTAN POTASSIUM 50 MG PO TABS: 25.0000 mg | ORAL_TABLET | Freq: Every day | ORAL | 1 refills | Status: DC

## 2022-02-12 ENCOUNTER — Encounter: Payer: Self-pay | Admitting: Family Medicine

## 2022-02-12 ENCOUNTER — Ambulatory Visit (INDEPENDENT_AMBULATORY_CARE_PROVIDER_SITE_OTHER): Payer: Managed Care, Other (non HMO) | Admitting: Family Medicine

## 2022-02-12 VITALS — BP 128/72 | HR 81 | Temp 98.4°F | Ht 67.0 in | Wt 193.6 lb

## 2022-02-12 DIAGNOSIS — Z6832 Body mass index (BMI) 32.0-32.9, adult: Secondary | ICD-10-CM | POA: Diagnosis not present

## 2022-02-12 DIAGNOSIS — Z0000 Encounter for general adult medical examination without abnormal findings: Secondary | ICD-10-CM

## 2022-02-12 DIAGNOSIS — Z131 Encounter for screening for diabetes mellitus: Secondary | ICD-10-CM

## 2022-02-12 DIAGNOSIS — Z23 Encounter for immunization: Secondary | ICD-10-CM | POA: Diagnosis not present

## 2022-02-12 DIAGNOSIS — I10 Essential (primary) hypertension: Secondary | ICD-10-CM

## 2022-02-12 DIAGNOSIS — E669 Obesity, unspecified: Secondary | ICD-10-CM | POA: Diagnosis not present

## 2022-02-12 DIAGNOSIS — Z13220 Encounter for screening for lipoid disorders: Secondary | ICD-10-CM | POA: Diagnosis not present

## 2022-02-12 DIAGNOSIS — Z6830 Body mass index (BMI) 30.0-30.9, adult: Secondary | ICD-10-CM

## 2022-02-12 DIAGNOSIS — F411 Generalized anxiety disorder: Secondary | ICD-10-CM

## 2022-02-12 LAB — COMPREHENSIVE METABOLIC PANEL
ALT: 14 U/L (ref 0–35)
AST: 15 U/L (ref 0–37)
Albumin: 4.1 g/dL (ref 3.5–5.2)
Alkaline Phosphatase: 72 U/L (ref 39–117)
BUN: 9 mg/dL (ref 6–23)
CO2: 27 mEq/L (ref 19–32)
Calcium: 9.3 mg/dL (ref 8.4–10.5)
Chloride: 104 mEq/L (ref 96–112)
Creatinine, Ser: 0.74 mg/dL (ref 0.40–1.20)
GFR: 93.05 mL/min (ref >60.00)
Glucose, Bld: 90 mg/dL (ref 70–99)
Potassium: 4.5 mEq/L (ref 3.5–5.1)
Sodium: 138 mEq/L (ref 135–145)
Total Bilirubin: 0.7 mg/dL (ref 0.2–1.2)
Total Protein: 7.6 g/dL (ref 6.0–8.3)

## 2022-02-12 LAB — LIPID PANEL
Cholesterol: 173 mg/dL (ref 0–200)
HDL: 86 mg/dL (ref >39.00)
LDL Cholesterol: 76 mg/dL (ref 0–99)
NonHDL: 86.83
Total CHOL/HDL Ratio: 2
Triglycerides: 52 mg/dL (ref 0.0–149.0)
VLDL: 10.4 mg/dL (ref 0.0–40.0)

## 2022-02-12 LAB — HEMOGLOBIN A1C: Hgb A1c MFr Bld: 5.4 % (ref 4.6–6.5)

## 2022-02-12 LAB — TSH: TSH: 1.71 u[IU]/mL (ref 0.35–5.50)

## 2022-02-12 MED ORDER — FLUOXETINE HCL 40 MG PO CAPS: 40.0000 mg | ORAL_CAPSULE | Freq: Every day | ORAL | 3 refills | Status: DC

## 2022-02-12 MED ORDER — LOSARTAN POTASSIUM 25 MG PO TABS: 25.0000 mg | ORAL_TABLET | Freq: Every day | ORAL | 1 refills | Status: DC

## 2022-02-12 MED ORDER — WEGOVY 0.5 MG/0.5ML ~~LOC~~ SOAJ: 0.5000 mg | SUBCUTANEOUS | 3 refills | Status: DC

## 2022-02-12 NOTE — Patient Instructions (Addendum)
For blood pressure - Keep a record of your blood pressures outside of the office and if over 130/80 - restart losartan at lower dose - 22m once per day.  Let me know if there are questions.   Shingrix today, repeat in 2-6 months.  I recommend tetanus, flu and covid booster as soon as possible.  Call dentist for follow up.    Thanks for coming in today.   Preventive Care [id number] Old, Female Preventive care refers to lifestyle choices and visits with your health care provider that can promote health and wellness. Preventive care visits are also called wellness exams. What can I expect for my preventive care visit? Counseling Your health care provider may ask you questions about your: Medical history, including: Past medical problems. Family medical history. Pregnancy history. Current health, including: Menstrual cycle. Method of birth control. Emotional well-being. Home life and relationship well-being. Sexual activity and sexual health. Lifestyle, including: Alcohol, nicotine or tobacco, and drug use. Access to firearms. Diet, exercise, and sleep habits. Work and work [employer] Sunscreen use. Safety issues such as seatbelt and bike helmet use. Physical exam Your health care provider will check your: Height and weight. These may be used to calculate your BMI (body mass index). BMI is a measurement that tells if you are at a healthy weight. Waist circumference. This measures the distance around your waistline. This measurement also tells if you are at a healthy weight and may help predict your risk of certain diseases, such as type 2 diabetes and high blood pressure. Heart rate and blood pressure. Body temperature. Skin for abnormal spots. What immunizations do I need?  Vaccines are usually given at various ages, according to a schedule. Your health care provider will recommend vaccines for you based on your age, medical history, and lifestyle or other factors, such as  travel or where you work. What tests do I need? Screening Your health care provider may recommend screening tests for certain conditions. This may include: Lipid and cholesterol levels. Diabetes screening. This is done by checking your blood sugar (glucose) after you have not eaten for a while (fasting). Pelvic exam and Pap test. Hepatitis B test. Hepatitis C test. HIV (human immunodeficiency virus) test. STI (sexually transmitted infection) testing, if you are at risk. Lung cancer screening. Colorectal cancer screening. Mammogram. Talk with your health care provider about when you should start having regular mammograms. This may depend on whether you have a family history of breast cancer. BRCA-related cancer screening. This may be done if you have a family history of breast, ovarian, tubal, or peritoneal cancers. Bone density scan. This is done to screen for osteoporosis. Talk with your health care provider about your test results, treatment options, and if necessary, the need for more tests. Follow these instructions at home: Eating and drinking  Eat a diet that includes fresh fruits and vegetables, whole grains, lean protein, and low-fat dairy products. Take vitamin and mineral supplements as recommended by your health care provider. Do not drink alcohol if: Your health care provider tells you not to drink. You are pregnant, may be pregnant, or are planning to become pregnant. If you drink alcohol: Limit how much you have to 0-1 drink a day. Know how much alcohol is in your drink. In the [place] one drink equals one 12 oz bottle of beer (355 mL), one 5 oz glass of wine (148 mL), or one 1 oz glass of hard liquor (44 mL). Lifestyle Brush your teeth every morning and  night with fluoride toothpaste. Floss one time each day. Exercise for at least 30 minutes 5 or more days each week. Do not use any products that contain nicotine or tobacco. These products include cigarettes, chewing  tobacco, and vaping devices, such as e-cigarettes. If you need help quitting, ask your health care provider. Do not use drugs. If you are sexually active, practice safe sex. Use a condom or other form of protection to prevent STIs. If you do not wish to become pregnant, use a form of birth control. If you plan to become pregnant, see your health care provider for a prepregnancy visit. Take aspirin only as told by your health care provider. Make sure that you understand how much to take and what form to take. Work with your health care provider to find out whether it is safe and beneficial for you to take aspirin daily. Find healthy ways to manage stress, such as: Meditation, yoga, or listening to music. Journaling. Talking to a trusted person. Spending time with friends and family. Minimize exposure to UV radiation to reduce your risk of skin cancer. Safety Always wear your seat belt while driving or riding in a vehicle. Do not drive: If you have been drinking alcohol. Do not ride with someone who has been drinking. When you are tired or distracted. While texting. If you have been using any mind-altering substances or drugs. Wear a helmet and other protective equipment during sports activities. If you have firearms in your house, make sure you follow all gun safety procedures. Seek help if you have been physically or sexually abused. What's next? Visit your health care provider once a year for an annual wellness visit. Ask your health care provider how often you should have your eyes and teeth checked. Stay up to date on all vaccines. This information is not intended to replace advice given to you by your health care provider. Make sure you discuss any questions you have with your health care provider. Document Revised: 11/13/2020 Document Reviewed: 11/13/2020 Elsevier Patient Education  [place].

## 2022-02-12 NOTE — Progress Notes (Unsigned)
Subjective:  Patient ID: Brittney Bell, female    DOB: [birth year]  Age: 51 y.o. MRN: [id number]  CC:  Chief Complaint  Patient presents with   Annual Exam    Pt wants to know about getting wegovy at a discount  Also please go over the dose of her losartan    HPI CECILLIA Bell presents for Annual Exam And other concerns as above  PCP: me GYN: Dr. Corinna Capra. Appt 2 weeks ago, had pap - negative. Mammogram yesterday.   Hypertension: Continue losartan 50 mg at her December 2022 visit. Option of half pill - trouble splitting.  Reassuring echo, stress testing in 2021. Dr. Bettina Gavia.  Last took med 8-9 days ago.  No home readings: BP Readings from Last 3 Encounters:  02/12/22 128/72  05/16/21 122/78  02/07/21 120/80   Lab Results  Component Value Date   CREATININE 0.74 02/07/2021   Obesity Treated with semaglutide.  No new side effects. No n/v/abd pain. Was losing weigh on meds - down to 183. Up to 224 prior. Some weight gain off meds.  Off meds since March. Was tolerating well prior, become costly. Needs new rx.  Lab Results  Component Value Date   HGBA1C 5.4 02/07/2021  Body mass index is 30.32 kg/m. Wt Readings from Last 3 Encounters:  02/12/22 193 lb 9.6 oz (87.8 kg)  05/16/21 191 lb (86.6 kg)  02/07/21 209 lb (94.8 kg)     Anxiety: Improved previously on higher dose of fluoxetine, improved focus on higher dose. Currently 40 mg daily. Still working well, would like to stay at same dose.     02/12/2022    9:36 AM 05/16/2021   10:54 AM 02/07/2021   10:59 AM 07/12/2020    3:47 PM 04/23/2020    8:07 AM  Depression screen PHQ 2/9  Decreased Interest 0 0 0 0 0  Down, Depressed, Hopeless 0 1 0 0 0  PHQ - 2 Score 0 1 0 0 0  Altered sleeping _0 Tired, decreased energy 1 0 2    Change in appetite 0 0 0    Feeling bad or failure about yourself  0 0 0    Trouble concentrating 0 0 0    Moving slowly or fidgety/restless 0 0 0    Suicidal thoughts 0 0 0    PHQ-9  Score _1 Difficult doing work/chores  Not difficult at all Not difficult at all            02/12/2022    9:36 AM 05/16/2021   10:54 AM 02/07/2021   10:59 AM 07/12/2020    3:47 PM 04/23/2020    8:07 AM  Depression screen PHQ 2/9  Decreased Interest 0 0 0 0 0  Down, Depressed, Hopeless 0 1 0 0 0  PHQ - 2 Score 0 1 0 0 0  Altered sleeping _2 Tired, decreased energy 1 0 2    Change in appetite 0 0 0    Feeling bad or failure about yourself  0 0 0    Trouble concentrating 0 0 0    Moving slowly or fidgety/restless 0 0 0    Suicidal thoughts 0 0 0    PHQ-9 Score _3 Difficult doing work/chores  Not difficult at all Not difficult at all      Health Maintenance  Topic Date Due   Zoster Vaccines-  Shingrix (1 of 2) Never done   COVID-19 Vaccine (3 - Moderna series) 02/28/2022 (Originally 12/27/2019)   INFLUENZA VACCINE  08/30/2022 (Originally 12/30/2021)   COLONOSCOPY (Pts 45-83yr Insurance coverage will need to be confirmed)  02/13/2023 (Originally 09/29/2014)   TETANUS/TDAP  02/13/2023 (Originally 09/28/1988)   Hepatitis C Screening  02/13/2023 (Originally 09/29/1987)   MAMMOGRAM  02/12/2024   PAP SMEAR-Modifier  01/29/2025   HIV Screening  Completed   HPV VACCINES  Aged Out  Gynecology recently referred for colonoscopy - [place].  Pap test few weeks ago, mammogram yesterday.    Immunization History  Administered Date(s) Administered   Influenza,inj,Quad PF,6+ Mos 05/15/2017, 05/16/2021   Influenza-Unspecified 03/04/2018, 03/14/2020, 03/27/2020   Moderna Sars-Covid-2 Vaccination 10/04/2019, 11/01/2019  Flu vaccine: declines today.  Shingrix today.  Covid booster recommended.  Tdap - declined today.   No results found. Due for repeat optho eval. Appt last year. Glasses.   Dental: no recent visit. Plans on scheduling - has dentist.   Alcohol: 3-4 per week  Tobacco: quit in 2017. None now  Exercise: no regular exercise. Keeps up with steps - 5k per day.     History Patient Active Problem List   Diagnosis Date Noted   Major depressive disorder    Low back pain    Generalized anxiety disorder 05/20/2017   Essential hypertension, benign 05/15/2017   Past Medical History:  Diagnosis Date   Cellulitis and abscess of face 01/29/2013   Essential hypertension, benign 05/15/2017   Generalized anxiety disorder 05/20/2017   Low back pain    Major depressive disorder    Past Surgical History:  Procedure Laterality Date   BREAST CYST EXCISION Right 03/05/2020   Procedure: Excision of right nipple cyst;  Surgeon: TDonnie Mesa MD;  Location: [facility]  Service: General;  Laterality: Right;  lma   TUBAL LIGATION     Allergies  Allergen Reactions   Azithromycin Hives   Tizanidine Other (See Comments)    Hypotension, severe 80/50s   Levaquin [Levofloxacin] Palpitations   Prior to Admission medications   Medication Sig Start Date End Date Taking? Authorizing Provider  FLUoxetine (PROZAC) 40 MG capsule Take 1 capsule (40 mg total) by mouth daily. 02/07/21  Yes MMaximiano Coss NP  losartan (COZAAR) 50 MG tablet Take 0.5 tablets (25 mg total) by mouth daily. 02/11/22  Yes GWendie Agreste MD  FLUoxetine (PROZAC) 20 MG tablet Take 1 tablet (20 mg total) by mouth daily. 01/30/21   GWendie Agreste MD  Semaglutide-Weight Management ([facility] 0.5 MG/0.5ML SOAJ Inject 0.5 mg into the skin once a week. Patient not taking: Reported on 02/12/2022 07/28/21   GWendie Agreste MD   Social History   Socioeconomic History   Marital status: Widowed    Spouse name: Not on file   Number of children: Not on file   Years of education: 13   Highest education level: Some college, no degree  Occupational History   Not on file  Tobacco Use   Smoking status: Former    Types: Cigarettes    Quit date: 11/30/2015    Years since quitting: 6.2   Smokeless tobacco: Never  Substance and Sexual Activity   Alcohol use: Yes    Alcohol/week: 0.0  standard drinks of alcohol    Comment: socially   Drug use: No   Sexual activity: Yes  Other Topics Concern   Not on file  Social History Narrative   Right handed   Lives alone  Social Determinants of Health   Financial Resource Strain: Not on file  Food Insecurity: Not on file  Transportation Needs: Not on file  Physical Activity: Not on file  Stress: Not on file  Social Connections: Not on file  Intimate Partner Violence: Not on file    Review of Systems 13 point review of systems per patient health survey noted.  Negative other than as indicated above or in HPI.    Objective:   Vitals:   02/12/22 0942  BP: 128/72  Pulse: 81  Temp: 98.4 F (36.9 C)  SpO2: 98%  Weight: 193 lb 9.6 oz (87.8 kg)  Height: 5' 7" (1.702 m)     Physical Exam Constitutional:      Appearance: She is well-developed.  HENT:     Head: Normocephalic and atraumatic.     Right Ear: External ear normal.     Left Ear: External ear normal.  Eyes:     Conjunctiva/sclera: Conjunctivae normal.     Pupils: Pupils are equal, round, and reactive to light.  Neck:     Thyroid: No thyromegaly.  Cardiovascular:     Rate and Rhythm: Normal rate and regular rhythm.     Heart sounds: Normal heart sounds. No murmur heard. Pulmonary:     Effort: Pulmonary effort is normal. No respiratory distress.     Breath sounds: Normal breath sounds. No wheezing.  Abdominal:     General: Bowel sounds are normal.     Palpations: Abdomen is soft.     Tenderness: There is no abdominal tenderness.  Musculoskeletal:        General: No tenderness. Normal range of motion.     Cervical back: Normal range of motion and neck supple.  Lymphadenopathy:     Cervical: No cervical adenopathy.  Skin:    General: Skin is warm and dry.     Findings: No rash.  Neurological:     Mental Status: She is alert and oriented to person, place, and time.  Psychiatric:        Behavior: Behavior normal.        Thought Content:  Thought content normal.     Assessment & Plan:  VIANNE GRIESHOP is a 51 y.o. female . Annual physical exam  - -anticipatory guidance as below in AVS, screening labs above. Health maintenance items as above in HPI discussed/recommended as applicable.   Essential hypertension, benign - Plan: Comprehensive metabolic panel, losartan (COZAAR) 25 MG tablet, TSH  -Overall stable control as above and off meds recently.  Home monitoring discussed and if blood pressure increases will restart 25 mg -new prescription given so she does not have to split pills.  Check labs.  Lipid screening - Plan: Lipid panel  Need for shingles vaccine - Plan: Varicella-zoster vaccine IM  Potential side effects discussed.  Repeat 2 to 6 months.  Class 1 obesity with serious comorbidity and body mass index (BMI) of 30.0 to 30.9 in adult, unspecified obesity type - Plan: TSH, Hemoglobin A1c BMI 32.0-32.9,adult - Plan: Semaglutide-Weight Management (WEGOVY) 0.5 MG/0.5ML SOAJ  -Tolerating semaglutide with improvement previously.  Reordered.  Generalized anxiety disorder - Plan: FLUoxetine (PROZAC) 40 MG capsule  -Stable on current dose fluoxetine, continue same. Screening for diabetes mellitus - Plan: Hemoglobin A1c   Meds ordered this encounter  Medications   losartan (COZAAR) 25 MG tablet    Sig: Take 1 tablet (25 mg total) by mouth daily.    Dispense:  90 tablet    Refill:  1    Ok to place on hold.   Semaglutide-Weight Management (WEGOVY) 0.5 MG/0.5ML SOAJ    Sig: Inject 0.5 mg into the skin once a week.    Dispense:  2 mL    Refill:  3   FLUoxetine (PROZAC) 40 MG capsule    Sig: Take 1 capsule (40 mg total) by mouth daily.    Dispense:  90 capsule    Refill:  3   Patient Instructions  For blood pressure - Keep a record of your blood pressures outside of the office and if over 130/80 - restart losartan at lower dose - 85m once per day.  Let me know if there are questions.   Shingrix today, repeat  in 2-6 months.  I recommend tetanus, flu and covid booster as soon as possible.  Call dentist for follow up.    Thanks for coming in today.   Preventive Care [id number] Old, Female Preventive care refers to lifestyle choices and visits with your health care provider that can promote health and wellness. Preventive care visits are also called wellness exams. What can I expect for my preventive care visit? Counseling Your health care provider may ask you questions about your: Medical history, including: Past medical problems. Family medical history. Pregnancy history. Current health, including: Menstrual cycle. Method of birth control. Emotional well-being. Home life and relationship well-being. Sexual activity and sexual health. Lifestyle, including: Alcohol, nicotine or tobacco, and drug use. Access to firearms. Diet, exercise, and sleep habits. Work and work [employer] Sun[employer] use. Safety issues such as seatbelt and bike helmet use. Physical exam Your health care provider will check your: Height and weight. These may be used to calculate your BMI (body mass index). BMI is a measurement that tells if you are at a healthy weight. Waist circumference. This measures the distance around your waistline. This measurement also tells if you are at a healthy weight and may help predict your risk of certain diseases, such as type 2 diabetes and high blood pressure. Heart rate and blood pressure. Body temperature. Skin for abnormal spots. What immunizations do I need?  Vaccines are usually given at various ages, according to a schedule. Your health care provider will recommend vaccines for you based on your age, medical history, and lifestyle or other factors, such as travel or where you work. What tests do I need? Screening Your health care provider may recommend screening tests for certain conditions. This may include: Lipid and cholesterol levels. Diabetes screening. This is  done by checking your blood sugar (glucose) after you have not eaten for a while (fasting). Pelvic exam and Pap test. Hepatitis B test. Hepatitis C test. HIV (human immunodeficiency virus) test. STI (sexually transmitted infection) testing, if you are at risk. Lung cancer screening. Colorectal cancer screening. Mammogram. Talk with your health care provider about when you should start having regular mammograms. This may depend on whether you have a family history of breast cancer. BRCA-related cancer screening. This may be done if you have a family history of breast, ovarian, tubal, or peritoneal cancers. Bone density scan. This is done to screen for osteoporosis. Talk with your health care provider about your test results, treatment options, and if necessary, the need for more tests. Follow these instructions at home: Eating and drinking  Eat a diet that includes fresh fruits and vegetables, whole grains, lean protein, and low-fat dairy products. Take vitamin and mineral supplements as recommended by your health care provider. Do not drink alcohol if:  Your health care provider tells you not to drink. You are pregnant, may be pregnant, or are planning to become pregnant. If you drink alcohol: Limit how much you have to 0-1 drink a day. Know how much alcohol is in your drink. In the [place] one drink equals one 12 oz bottle of beer (355 mL), one 5 oz glass of wine (148 mL), or one 1 oz glass of hard liquor (44 mL). Lifestyle Brush your teeth every morning and night with fluoride toothpaste. Floss one time each day. Exercise for at least 30 minutes 5 or more days each week. Do not use any products that contain nicotine or tobacco. These products include cigarettes, chewing tobacco, and vaping devices, such as e-cigarettes. If you need help quitting, ask your health care provider. Do not use drugs. If you are sexually active, practice safe sex. Use a condom or other form of protection to  prevent STIs. If you do not wish to become pregnant, use a form of birth control. If you plan to become pregnant, see your health care provider for a prepregnancy visit. Take aspirin only as told by your health care provider. Make sure that you understand how much to take and what form to take. Work with your health care provider to find out whether it is safe and beneficial for you to take aspirin daily. Find healthy ways to manage stress, such as: Meditation, yoga, or listening to music. Journaling. Talking to a trusted person. Spending time with friends and family. Minimize exposure to UV radiation to reduce your risk of skin cancer. Safety Always wear your seat belt while driving or riding in a vehicle. Do not drive: If you have been drinking alcohol. Do not ride with someone who has been drinking. When you are tired or distracted. While texting. If you have been using any mind-altering substances or drugs. Wear a helmet and other protective equipment during sports activities. If you have firearms in your house, make sure you follow all gun safety procedures. Seek help if you have been physically or sexually abused. What's next? Visit your health care provider once a year for an annual wellness visit. Ask your health care provider how often you should have your eyes and teeth checked. Stay up to date on all vaccines. This information is not intended to replace advice given to you by your health care provider. Make sure you discuss any questions you have with your health care provider. Document Revised: 11/13/2020 Document Reviewed: 11/13/2020 Elsevier Patient Education  [place],   Merri Ray, MD [place] 02/12/22 10:17 AM

## 2022-02-18 ENCOUNTER — Telehealth: Payer: Self-pay | Admitting: Family Medicine

## 2022-02-18 DIAGNOSIS — E669 Obesity, unspecified: Secondary | ICD-10-CM

## 2022-02-18 NOTE — Telephone Encounter (Signed)
Pharmacy called stating that they are out of the [facility] 0.5mg /0.83ml. Pharmacy states they only have [place] 1 mg,1.7 mg and 2.4 mg in stock. Pharmacy want to know what to do next about this medication for this pt .

## 2022-02-18 NOTE — Telephone Encounter (Signed)
If she had been taking the 0.5 mg, we could try increasing to 1 mg but I am concerned with going from no medications right up to that dose.  I would recommend checking with the pharmacy in the next few weeks to see if the 0.5 mg is available.  If no updates in the next week or 2, we can look at trying the 1 mg with watching closely for side effects at that dose.  Let me know if she is okay with this plan.

## 2022-02-19 NOTE — Telephone Encounter (Signed)
Pt wondered if she could try phentermine instead as she doesn't want to start gain weight again and notes her BP has been good since it has been down notes she can afford this without insurance which is a plus

## 2022-02-20 MED ORDER — PHENTERMINE HCL 15 MG PO CAPS: 15.0000 mg | ORAL_CAPSULE | ORAL | 0 refills | Status: DC

## 2022-02-20 NOTE — Telephone Encounter (Addendum)
See telephone messages.  Ozempic shortage, temporary phentermine planned, but will not use beyond 12 weeks.  Potential risks and side effects were discussed and monitoring of blood pressure stressed.  RTC precautions.  Controlled substance database reviewed without concerns. Cancelled initial rx to [facility].

## 2022-02-20 NOTE — Telephone Encounter (Signed)
Phentermine may be a temporary option, but would not want to use that beyond 2 to 3 months at the most.  12 weeks would be the limit.  We could try that temporarily until her Ozempic dose is available.  Would need to keep a close eye on her blood pressure, will need to monitor that every day or 2 and make sure not above 140/90.  Also can see some other side effects such as can increased heart rate, insomnia, dry mouth, constipation, and nervousness.  If she is okay with this approach, let me know and I will send in phentermine temporarily.

## 2022-02-20 NOTE — Telephone Encounter (Signed)
Called pt and explained this, she is happy to do temporary therapy to prevent weight gain until ozempic is available, notes she will monitor BP and HR as recommended has been doing her own research to be educated.   Would like sent to [facility] in [place]

## 2022-02-20 NOTE — Addendum Note (Signed)
Addended by: Merri Ray R on: 02/20/2022 06:29 PM   Modules accepted: Orders

## 2022-03-23 ENCOUNTER — Other Ambulatory Visit: Payer: Self-pay | Admitting: Family Medicine

## 2022-03-23 DIAGNOSIS — E669 Obesity, unspecified: Secondary | ICD-10-CM

## 2022-03-23 NOTE — Telephone Encounter (Signed)
Patient is requesting a refill of the following medications: Requested Prescriptions   Pending Prescriptions Disp Refills   phentermine 15 MG capsule [Pharmacy Med Name: PHENTERMINE HCL 15 MG CAP] 30 capsule     Sig: TAKE ONE CAPSULE (15 MG TOTAL) BY MOUTH EVERY MORNING    Date of patient request: 03/23/22 Last office visit: 02/12/22 Date of last refill: 02/20/22 Last refill amount: 30

## 2022-03-24 NOTE — Telephone Encounter (Signed)
I agreed on temporary phentermine during a shortage of Ozempic.  Started September 20.  Controlled substance database reviewed.  Phentermine 15 mg #30 filled on 02/21/2022, refill ordered today.

## 2022-04-28 ENCOUNTER — Other Ambulatory Visit: Payer: Self-pay | Admitting: Family Medicine

## 2022-04-28 DIAGNOSIS — E669 Obesity, unspecified: Secondary | ICD-10-CM

## 2022-04-28 DIAGNOSIS — Z6830 Body mass index (BMI) 30.0-30.9, adult: Secondary | ICD-10-CM

## 2022-04-29 NOTE — Telephone Encounter (Signed)
Phentermine short-term discussed during shortage of Ozempic.  Started September 20.  Can refill 1 additional time, then will need to come off that medication as max 12 weeks.  Can check into availability of Ozempic during that time so we can change back to previous regimen.  Let me know if there are questions.

## 2022-04-29 NOTE — Telephone Encounter (Signed)
Phentermine 15 mg LOV: 02/12/22 Last Refill:03/24/22 Upcoming appt: 08/13/22

## 2022-06-08 ENCOUNTER — Other Ambulatory Visit: Payer: Self-pay | Admitting: Family Medicine

## 2022-06-08 DIAGNOSIS — E669 Obesity, unspecified: Secondary | ICD-10-CM

## 2022-06-08 MED ORDER — OZEMPIC (0.25 OR 0.5 MG/DOSE) 2 MG/3ML ~~LOC~~ SOPN: 0.5000 mg | PEN_INJECTOR | SUBCUTANEOUS | 1 refills | Status: DC

## 2022-06-08 NOTE — Telephone Encounter (Signed)
See last refill note. Phentermine short-term discussed during shortage of Ozempic. Started September 20th, planned max 12 weeks. Can check into availability of Ozempic at this time - I sent in 0.5mg  dose. Let me know if there are questions.

## 2022-06-08 NOTE — Telephone Encounter (Signed)
Phentermine 15 mg LOV: 02/12/22 Last Refill:04/29/22 Upcoming appt: none

## 2022-06-24 ENCOUNTER — Ambulatory Visit (HOSPITAL_COMMUNITY)
Admission: EM | Admit: 2022-06-24 | Discharge: 2022-06-24 | Disposition: A | Discharge status: Home / Self Care | Payer: Managed Care, Other (non HMO) | Attending: Emergency Medicine | Admitting: Emergency Medicine

## 2022-06-24 ENCOUNTER — Other Ambulatory Visit (HOSPITAL_COMMUNITY): Payer: Self-pay

## 2022-06-24 ENCOUNTER — Telehealth: Payer: Self-pay

## 2022-06-24 ENCOUNTER — Encounter (HOSPITAL_COMMUNITY): Payer: Self-pay | Admitting: *Deleted

## 2022-06-24 DIAGNOSIS — J0140 Acute pansinusitis, unspecified: Secondary | ICD-10-CM

## 2022-06-24 MED ORDER — AMOXICILLIN-POT CLAVULANATE 875-125 MG PO TABS: 1.0000 | ORAL_TABLET | Freq: Two times a day (BID) | ORAL | 0 refills | Status: DC

## 2022-06-24 MED ORDER — IPRATROPIUM BROMIDE 0.03 % NA SOLN: 2.0000 | Freq: Two times a day (BID) | NASAL | 12 refills | Status: AC

## 2022-06-24 NOTE — Telephone Encounter (Signed)
Patient Advocate Encounter   Received notification from [facility] that prior authorization for Ozempic 2mg /50ml is required.   PA submitted on 06/24/2022 Key [id number] Status is pending

## 2022-06-24 NOTE — Discharge Instructions (Signed)
Today you are being treated for a sinus infection  Begin Augmentin every morning and every evening for 7 days, ideally you will see improvement in about 48 hours  Begin nasal spray every morning and every evening to further help clear the sinuses which would reduce pressure  For your lymph nodes you may apply warm compresses over the affected area as well as massage if you are able to tolerate for comfort  You may continue use of Excedrin for management of your headaches    You can take Tylenol and/or Ibuprofen as needed for fever reduction and pain relief.   For cough: honey 1/2 to 1 teaspoon (you can dilute the honey in water or another fluid).  You can also use guaifenesin and dextromethorphan for cough. You can use a humidifier for chest congestion and cough.  If you don't have a humidifier, you can sit in the bathroom with the hot shower running.      For sore throat: try warm salt water gargles, cepacol lozenges, throat spray, warm tea or water with lemon/honey, popsicles or ice, or OTC cold relief medicine for throat discomfort.   For congestion: take a daily anti-histamine like Zyrtec, Claritin, and a oral decongestant, such as pseudoephedrine.  You can also use Flonase 1-2 sprays in each nostril daily.   It is important to stay hydrated: drink plenty of fluids (water, gatorade/powerade/pedialyte, juices, or teas) to keep your throat moisturized and help further relieve irritation/discomfort.

## 2022-06-24 NOTE — ED Provider Notes (Signed)
[place]    CSN: [id number] Arrival date & time: 06/24/22  0809      History   Chief Complaint Chief Complaint  Patient presents with   Headache    HPI ADDALINE PEPLINSKI is a 52 y.o. female.   Evaluation of nasal congestion, mild bilateral ear pain, scratchy throat, nausea without vomiting and frontal sinus headaches beginning 6 days ago.  Headache has been occurring daily, starting at the posterior and radiating to the front sitting directly behind the bilateral eyes, described as aching, managed with Excedrin.  Has been known to experience swollen glands 1 day ago.  Exposure to COVID, home COVID test negative x 3.  Tolerating food and liquids.  Has additionally attempted use of Aleve.  Denies fever, chills, body aches, cough.  Past Medical History:  Diagnosis Date   Cellulitis and abscess of face 01/29/2013   Essential hypertension, benign 05/15/2017   Generalized anxiety disorder 05/20/2017   Low back pain    Major depressive disorder     Patient Active Problem List   Diagnosis Date Noted   Major depressive disorder    Low back pain    Generalized anxiety disorder 05/20/2017   Essential hypertension, benign 05/15/2017    Past Surgical History:  Procedure Laterality Date   BREAST CYST EXCISION Right 03/05/2020   Procedure: Excision of right nipple cyst;  Surgeon: Donnie Mesa, MD;  Location: [facility];  Service: General;  Laterality: Right;  lma   TUBAL LIGATION      OB History   No obstetric history on file.      Home Medications    Prior to Admission medications   Medication Sig Start Date End Date Taking? Authorizing Provider  FLUoxetine (PROZAC) 40 MG capsule Take 1 capsule (40 mg total) by mouth daily. 02/12/22  Yes Wendie Agreste, MD  losartan (COZAAR) 25 MG tablet Take 1 tablet (25 mg total) by mouth daily. 02/12/22  Yes Wendie Agreste, MD  phentermine 15 MG capsule TAKE ONE CAPSULE (15 MG TOTAL) BY MOUTH EVERY  MORNING 04/29/22   Wendie Agreste, MD  Semaglutide,0.25 or 0.5MG /DOS, (OZEMPIC, 0.25 OR 0.5 MG/DOSE,) 2 MG/3ML SOPN Inject 0.5 mg into the skin once a week. 06/08/22   Wendie Agreste, MD    Family History Family History  Problem Relation Age of Onset   Heart disease Mother    Hypertension Mother    Bipolar disorder Brother    ADD / ADHD Daughter    Dementia Other        Grandmother's sister    Social History Social History   Tobacco Use   Smoking status: Former    Types: Cigarettes    Quit date: 11/30/2015    Years since quitting: 6.5   Smokeless tobacco: Never  Substance Use Topics   Alcohol use: Yes    Alcohol/week: 0.0 standard drinks of alcohol    Comment: socially   Drug use: No     Allergies   Azithromycin, Tizanidine, and Levaquin [levofloxacin]   Review of Systems Review of Systems  Constitutional: Negative.   HENT:  Positive for congestion, ear pain and sore throat. Negative for dental problem, drooling, ear discharge, facial swelling, hearing loss, mouth sores, nosebleeds, postnasal drip, rhinorrhea, sinus pressure, sinus pain, sneezing, tinnitus, trouble swallowing and voice change.   Respiratory: Negative.    Gastrointestinal:  Positive for nausea. Negative for abdominal distention, abdominal pain, anal bleeding, blood in stool, constipation, diarrhea, rectal pain  and vomiting.  Neurological:  Positive for headaches. Negative for dizziness, tremors, seizures, syncope, facial asymmetry, speech difficulty, weakness, light-headedness and numbness.     Physical Exam Triage Vital Signs ED Triage Vitals  Enc Vitals Group     BP 06/24/22 0844 (!) 155/82     Pulse Rate 06/24/22 0844 68     Resp 06/24/22 0844 18     Temp 06/24/22 0844 98.5 F (36.9 C)     Temp Source 06/24/22 0844 Oral     SpO2 06/24/22 0844 98 %     Weight --      Height --      Head Circumference --      Peak Flow --      Pain Score 06/24/22 0841 5     Pain Loc --      Pain Edu?  --      Excl. in GC? --    No data found.  Updated Vital Signs BP (!) 155/82 (BP Location: Right Arm)   Pulse 68   Temp 98.5 F (36.9 C) (Oral)   Resp 18   LMP 05/25/2022 (Exact Date)   SpO2 98%   Visual Acuity Right Eye Distance:   Left Eye Distance:   Bilateral Distance:    Right Eye Near:   Left Eye Near:    Bilateral Near:     Physical Exam Constitutional:      Appearance: Normal appearance.  HENT:     Right Ear: Tympanic membrane, ear canal and external ear normal.     Left Ear: Tympanic membrane, ear canal and external ear normal.     Nose: Congestion present. No rhinorrhea.     Right Turbinates: Swollen.     Left Turbinates: Swollen.     Right Sinus: Maxillary sinus tenderness and frontal sinus tenderness present.     Left Sinus: Maxillary sinus tenderness and frontal sinus tenderness present.     Mouth/Throat:     Mouth: Mucous membranes are moist.     Pharynx: No posterior oropharyngeal erythema.  Cardiovascular:     Rate and Rhythm: Normal rate and regular rhythm.     Pulses: Normal pulses.     Heart sounds: Normal heart sounds.  Pulmonary:     Effort: Pulmonary effort is normal.     Breath sounds: Normal breath sounds.  Skin:    General: Skin is warm and dry.  Neurological:     Mental Status: She is alert and oriented to person, place, and time. Mental status is at baseline.  Psychiatric:        Mood and Affect: Mood normal.        Behavior: Behavior normal.      UC Treatments / Results  Labs (all labs ordered are listed, but only abnormal results are displayed) Labs Reviewed - No data to display  EKG   Radiology No results found.  Procedures Procedures (including critical care time)  Medications Ordered in UC Medications - No data to display  Initial Impression / Assessment and Plan / UC Course  I have reviewed the triage vital signs and the nursing notes.  Pertinent labs & imaging results that were available during my care of the  patient were reviewed by me and considered in my medical decision making (see chart for details).  Acute nonrecurrent pansinusitis  Vital signs are stable patient is no signs of distress nontoxic-appearing, tenderness is present throughout the sinus and she endorses infrequent headache, consistent with above diagnoses, symptoms have  been present for 1 week without signs of resolution we will provide bacterial coverage, amoxicillin prescribed as well as ipratropium for additional support, recommended continued use of Excedrin as needed as it has been helpful, may use additional over-the-counter medications as needed with urgent care follow-up as needed, work note given Final Clinical Impressions(s) / UC Diagnoses   Final diagnoses:  None   Discharge Instructions   None    ED Prescriptions   None    PDMP not reviewed this encounter.   Hans [place], [place] 06/24/22 [phone]

## 2022-06-24 NOTE — ED Triage Notes (Signed)
Pt states that last Thursday she started having burning eyes, headache, swollen glands in her neck. She took a at home COVID test Monday and today they have been negative twice. She is Excedrin which helps for like an hour she took last dose at 6am.

## 2022-07-03 MED ORDER — WEGOVY 0.5 MG/0.5ML ~~LOC~~ SOAJ: 0.5000 mg | SUBCUTANEOUS | 2 refills | Status: DC

## 2022-07-03 NOTE — Telephone Encounter (Signed)
Pharmacy Patient Advocate Encounter  Received notification from [employer] that the request for prior authorization for Ozempic has been denied due to .    Please be advised we currently do not have a Pharmacist to review denials, therefore you will need to process appeals accordingly as needed. Thanks for your support at this time.   If an appeal is needed, please see below:

## 2022-07-03 NOTE — Telephone Encounter (Signed)
Reordered as KCLEXN as that does have weight loss indication.

## 2022-07-03 NOTE — Telephone Encounter (Signed)
Please advise how you would like to proceed 

## 2022-07-08 ENCOUNTER — Other Ambulatory Visit (HOSPITAL_COMMUNITY): Payer: Self-pay

## 2022-07-08 NOTE — Telephone Encounter (Signed)
Patient Advocate Encounter   Received notification from Christella Scheuermann that prior authorization for [facility] is required.   PA submitted on 07/08/2022 Key [id number] Status is pending

## 2022-08-01 IMAGING — MG DIGITAL SCREENING BILAT W/ TOMO W/ CAD
8 series · 8 of 24 positions shown · non-contrast
Comparison: Previous exam(s).

CLINICAL DATA: Screening.

EXAM:
DIGITAL SCREENING BILATERAL MAMMOGRAM WITH TOMO AND CAD

[L CC synth-2D]
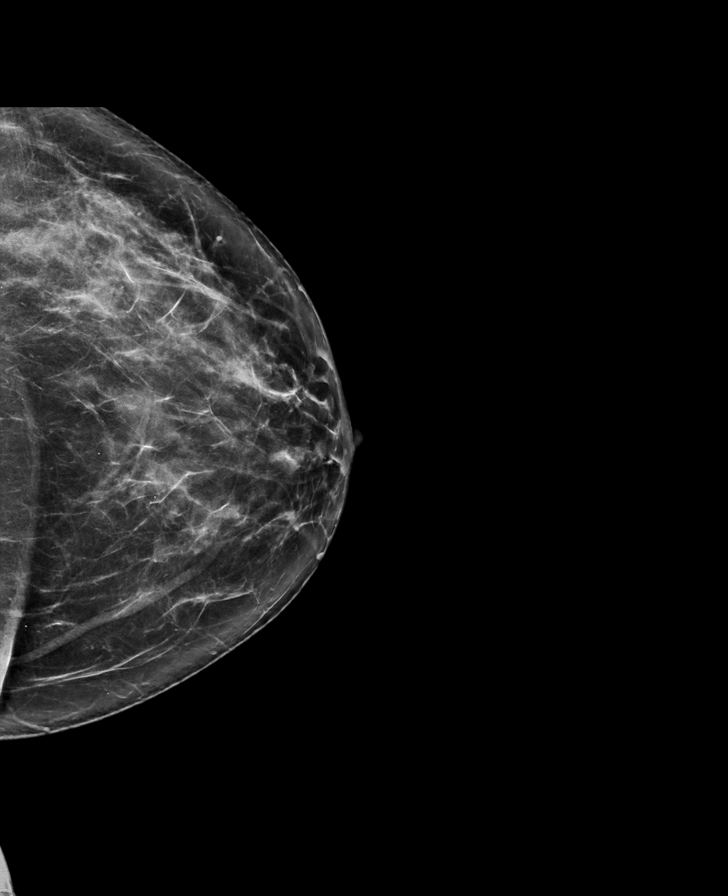

[L MLO synth-2D]
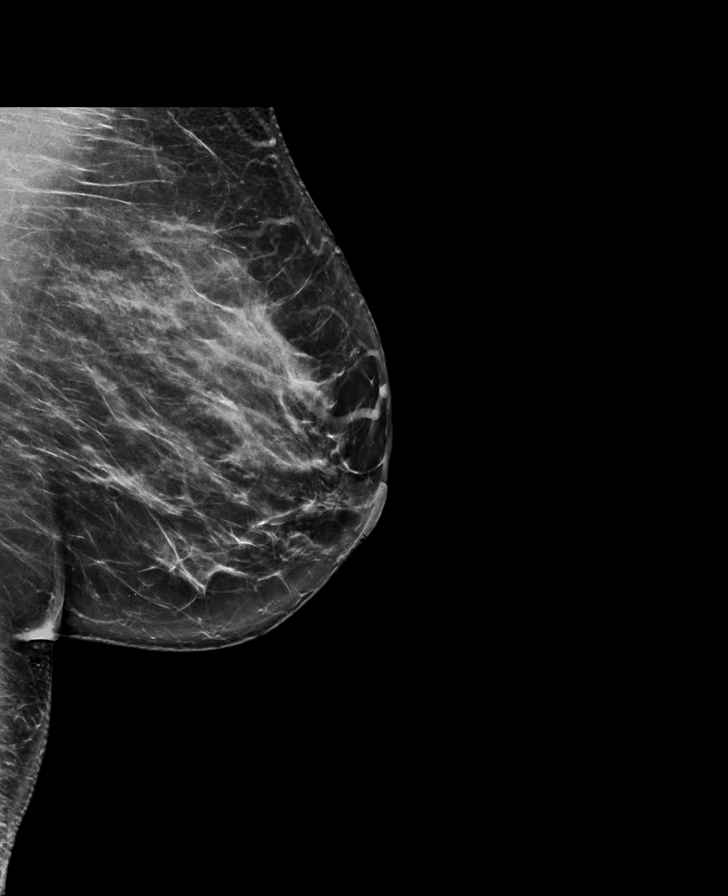

[R CC synth-2D]
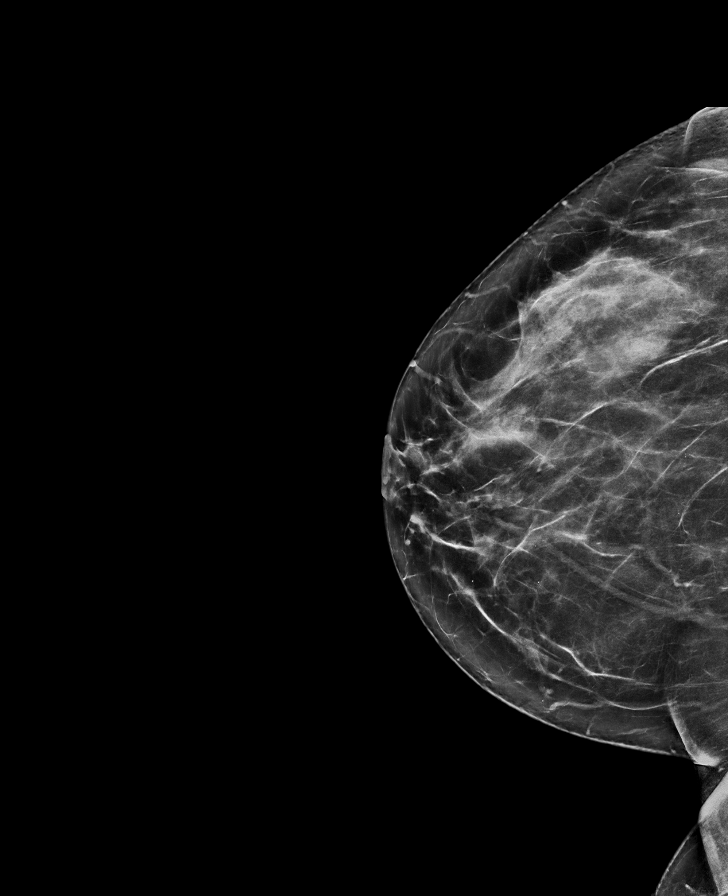

[R MLO synth-2D]
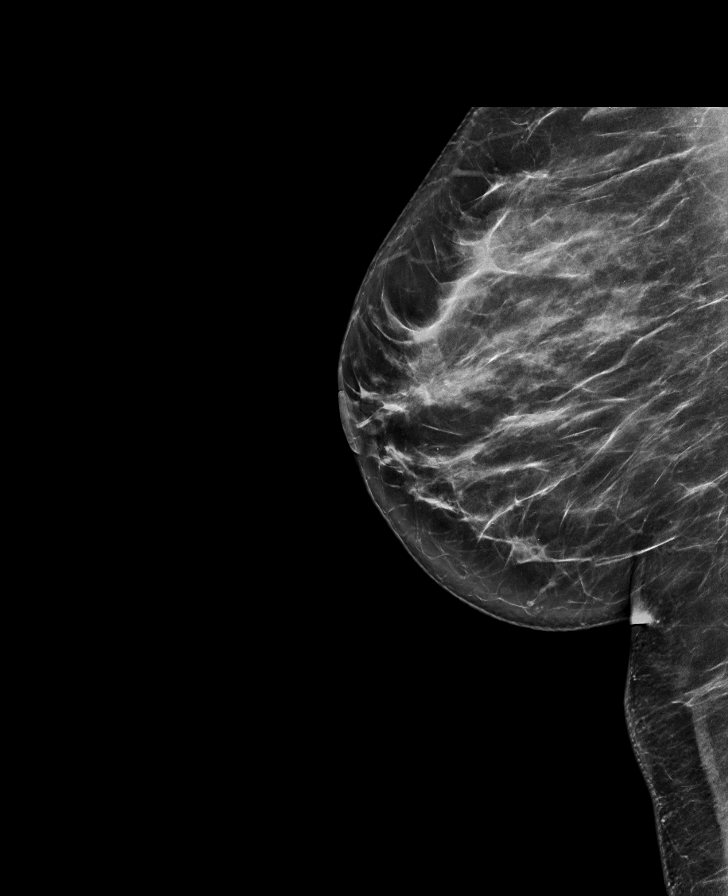

[R MLO tomo · tomo slice 39/77.0]
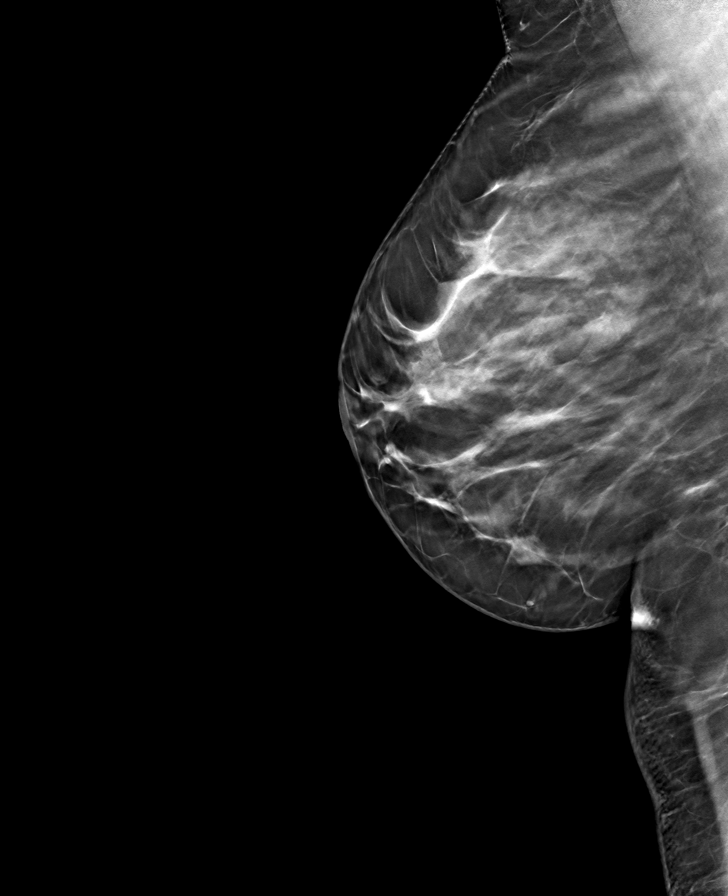

[R CC tomo · tomo slice 40/79.0]
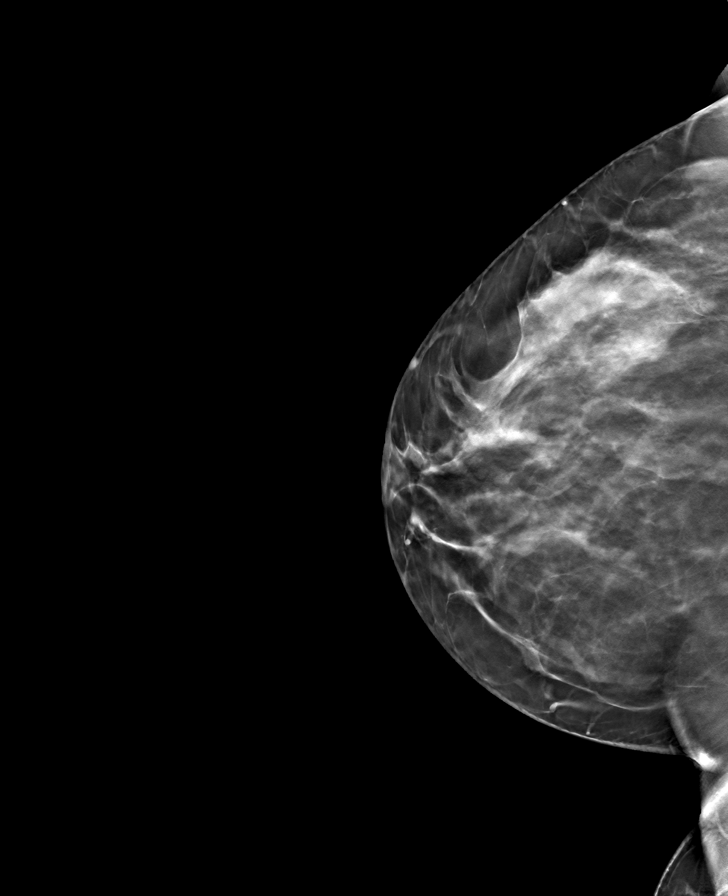

[L CC tomo · tomo slice 39/77.0]
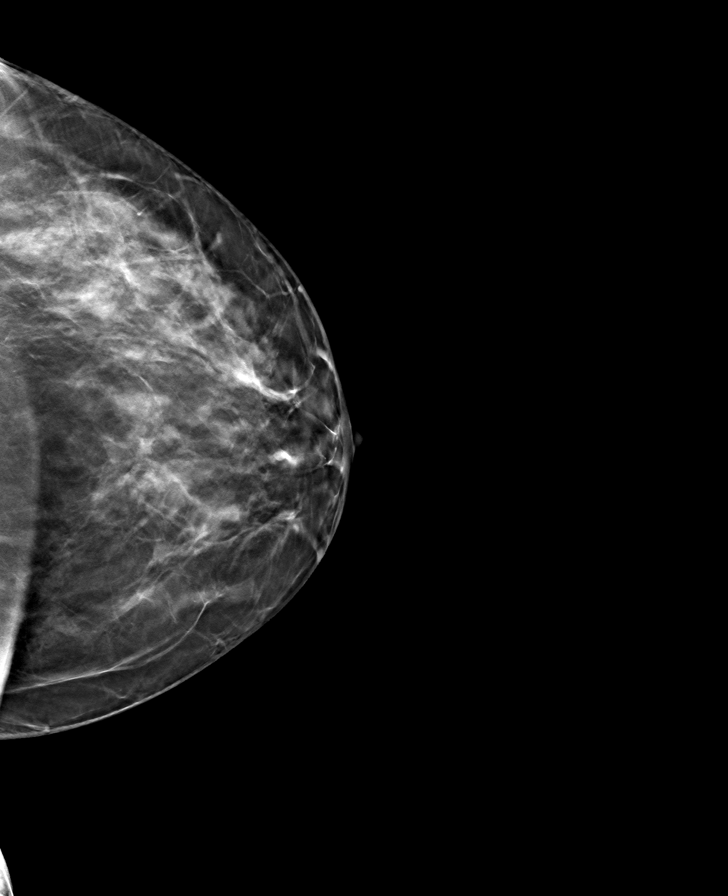

[L MLO tomo · tomo slice 41/82.0]
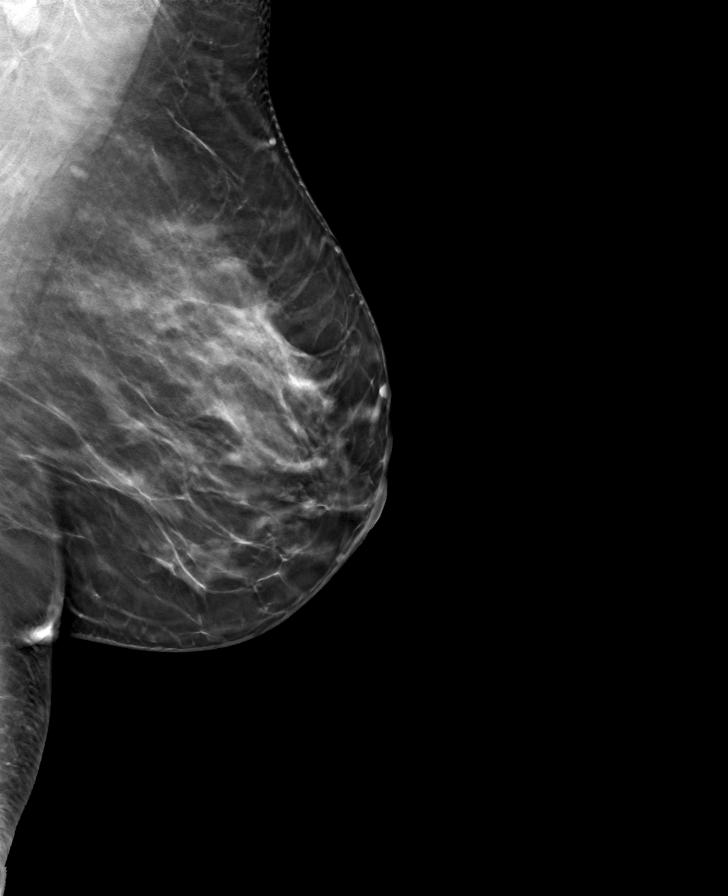

[8 of 24 positions shown; findings below may reference images not displayed]

ACR Breast Density Category c: The breast tissue is heterogeneously
dense, which may obscure small masses.
FINDINGS: There are no findings suspicious for malignancy. Images were
processed with CAD.
IMPRESSION: No mammographic evidence of malignancy. A result letter of this
screening mammogram will be mailed directly to the patient.

RECOMMENDATION:
Screening mammogram in one year. (Code:FT-U-LHB)

BI-RADS CATEGORY  1: Negative.

## 2022-08-13 ENCOUNTER — Ambulatory Visit: Payer: Managed Care, Other (non HMO) | Admitting: Family Medicine

## 2022-11-16 ENCOUNTER — Telehealth: Payer: Self-pay

## 2022-11-16 DIAGNOSIS — I10 Essential (primary) hypertension: Secondary | ICD-10-CM

## 2022-11-16 NOTE — Telephone Encounter (Addendum)
Pt called back to let us know she contacted [facility] because she saw she had 1 refill remaining and transferred it to [facility]. She will follow up with [place] if anything else needed.  Pt called back and is in of refill.

## 2022-11-16 NOTE — Telephone Encounter (Signed)
Pt is calling to request refill for BP medication, Losartan. Her BP has been high, last night systolic was 160's. Her next OV is 7/11.   Please confirm if pt needs to come in sooner for appt or refill medication is appropriate. Med pending

## 2022-11-18 ENCOUNTER — Other Ambulatory Visit: Payer: Self-pay

## 2022-11-18 DIAGNOSIS — I10 Essential (primary) hypertension: Secondary | ICD-10-CM

## 2022-11-18 MED ORDER — LOSARTAN POTASSIUM 25 MG PO TABS: 25.0000 mg | ORAL_TABLET | Freq: Every day | ORAL | 1 refills | Status: DC

## 2022-11-18 NOTE — Telephone Encounter (Signed)
Called patient and let her know we were sorry for the delay and that this has been sent in  Looks like it was routed directly to [facility] and not clinical and that may be the reason it had not yet been completed.

## 2022-11-18 NOTE — Telephone Encounter (Signed)
Patient still needing Losartan refill. Please advise   Pharmacy: [id number] - [place], [place] - [address]

## 2022-12-10 ENCOUNTER — Ambulatory Visit: Payer: Managed Care, Other (non HMO) | Admitting: Family Medicine

## 2022-12-14 ENCOUNTER — Ambulatory Visit: Payer: Self-pay | Admitting: Family Medicine

## 2023-02-22 ENCOUNTER — Other Ambulatory Visit: Payer: Self-pay

## 2023-02-22 ENCOUNTER — Emergency Department: Payer: Self-pay

## 2023-02-22 DIAGNOSIS — R0789 Other chest pain: Secondary | ICD-10-CM | POA: Diagnosis not present

## 2023-02-22 DIAGNOSIS — I10 Essential (primary) hypertension: Secondary | ICD-10-CM | POA: Diagnosis not present

## 2023-02-22 DIAGNOSIS — R42 Dizziness and giddiness: Secondary | ICD-10-CM | POA: Diagnosis not present

## 2023-02-22 DIAGNOSIS — R079 Chest pain, unspecified: Secondary | ICD-10-CM | POA: Diagnosis not present

## 2023-02-22 LAB — BASIC METABOLIC PANEL
Anion gap: 13 (ref 5–15)
BUN: 10 mg/dL (ref 6–20)
CO2: 23 mmol/L (ref 22–32)
Calcium: 9.2 mg/dL (ref 8.9–10.3)
Chloride: 101 mmol/L (ref 98–111)
Creatinine, Ser: 0.82 mg/dL (ref 0.44–1.00)
GFR, Estimated: >60 mL/min (ref >60)
Glucose, Bld: 119 mg/dL — ABNORMAL HIGH (ref 70–99)
Potassium: 3.3 mmol/L — ABNORMAL LOW (ref 3.5–5.1)
Sodium: 137 mmol/L (ref 135–145)

## 2023-02-22 LAB — CBC
HCT: 39.1 % (ref 36.0–46.0)
Hemoglobin: 13.3 g/dL (ref 12.0–15.0)
MCH: 31.1 pg (ref 26.0–34.0)
MCHC: 34 g/dL (ref 30.0–36.0)
MCV: 91.6 fL (ref 80.0–100.0)
Platelets: 304 10*3/uL (ref 150–400)
RBC: 4.27 MIL/uL (ref 3.87–5.11)
RDW: 11.6 % (ref 11.5–15.5)
WBC: 8.3 10*3/uL (ref 4.0–10.5)
nRBC: 0 % (ref 0.0–0.2)

## 2023-02-22 LAB — TROPONIN I (HIGH SENSITIVITY): Troponin I (High Sensitivity): <2 ng/L (ref <18)

## 2023-02-22 NOTE — ED Triage Notes (Addendum)
Pt presents to ER with c/o HTN and feeling light headed that started around one hour ago.  Pt states she was standing up, talking on the phone, and began to feel light headed.  Pt states she checked her BP when she started to feel light headed and it was 188/90.  Pt has hx of HTN and takes losartan.  States she took her last dose around 2000.  Pt denies hx od heart disease.  Pt is otherwise A&O x4, and in NAD at this time.  Denies any chest pain, but states her chest feels "weird."

## 2023-02-23 ENCOUNTER — Emergency Department
Admission: EM | Admit: 2023-02-23 | Discharge: 2023-02-23 | Disposition: A | Discharge status: Home / Self Care | Payer: BC Managed Care – PPO | Attending: Emergency Medicine | Admitting: Emergency Medicine

## 2023-02-23 DIAGNOSIS — R0789 Other chest pain: Secondary | ICD-10-CM

## 2023-02-23 DIAGNOSIS — R42 Dizziness and giddiness: Secondary | ICD-10-CM

## 2023-02-23 LAB — POC URINE PREG, ED: Preg Test, Ur: NEGATIVE

## 2023-02-23 LAB — TROPONIN I (HIGH SENSITIVITY): Troponin I (High Sensitivity): 3 ng/L (ref <18)

## 2023-02-23 NOTE — ED Provider Notes (Signed)
[facility] Provider Note    Event Date/Time   First MD Initiated Contact with Patient 02/23/23 0155     (approximate)   History   Chief Complaint Hypertension   HPI  Brittney Bell is a 52 y.o. female with past medical history of hypertension and anxiety who presents to the ED complaining of hypertension.  Patient reports that she had multiple episodes of lightheadedness earlier in the evening and feeling like she was going to pass out.  Episodes occurred while she was standing still, denies any associated difficulty breathing but has had a "weird" feeling in her chest.  She checked her blood pressure at home and found it to be significantly elevated, subsequently took her usual evening dose of losartan.  She now states that she feels better with no ongoing lightheadedness.     Physical Exam   Triage Vital Signs: ED Triage Vitals  Encounter Vitals Group     BP 02/22/23 2021 (!) 164/87     Systolic BP Percentile --      Diastolic BP Percentile --      Pulse Rate 02/22/23 2021 98     Resp 02/22/23 2021 18     Temp 02/22/23 2021 97.9 F (36.6 C)     Temp Source 02/22/23 2021 Oral     SpO2 02/22/23 2021 99 %     Weight 02/22/23 2022 209 lb (94.8 kg)     Height 02/22/23 2022 5\' 7"  (1.702 m)     Head Circumference --      Peak Flow --      Pain Score 02/22/23 2036 2     Pain Loc --      Pain Education --      Exclude from Growth Chart --     Most recent vital signs: Vitals:   02/22/23 2351 02/23/23 0249  BP: 129/73 114/77  Pulse: 69 61  Resp: 18 18  Temp: 98.2 F (36.8 C)   SpO2: 99% 96%    Constitutional: Alert and oriented. Eyes: Conjunctivae are normal. Head: Atraumatic. Nose: No congestion/rhinnorhea. Mouth/Throat: Mucous membranes are moist.  Cardiovascular: Normal rate, regular rhythm. Grossly normal heart sounds.  2+ radial pulses bilaterally. Respiratory: Normal respiratory effort.  No retractions. Lungs  CTAB. Gastrointestinal: Soft and nontender. No distention. Musculoskeletal: No lower extremity tenderness nor edema.  Neurologic:  Normal speech and language. No gross focal neurologic deficits are appreciated.    ED Results / Procedures / Treatments   Labs (all labs ordered are listed, but only abnormal results are displayed) Labs Reviewed  BASIC METABOLIC PANEL - Abnormal; Notable for the following components:      Result Value   Potassium 3.3 (*)    Glucose, Bld 119 (*)    All other components within normal limits  CBC  POC URINE PREG, ED  TROPONIN I (HIGH SENSITIVITY)  TROPONIN I (HIGH SENSITIVITY)     EKG  ED ECG REPORT I, Chesley Noon, the attending physician, personally viewed and interpreted this ECG.   Date: 02/23/2023  EKG Time: 20:26  Rate: 94  Rhythm: normal sinus rhythm  Axis: LAD  Intervals:none  ST&T Change: None  RADIOLOGY CXR reviewed and interpreted by me with no infiltrate, edema, or effusion.  PROCEDURES:  Critical Care performed: No  Procedures   MEDICATIONS ORDERED IN ED: Medications - No data to display   IMPRESSION / MDM / ASSESSMENT AND PLAN / ED COURSE  I reviewed the triage vital signs and the  nursing notes.                              53 y.o. female with past medical history of hypertension and anxiety who presents to the ED complaining of 2 episodes of dizziness and lightheadedness associated with elevated blood pressure.  Patient's presentation is most consistent with acute presentation with potential threat to life or bodily function.  Differential diagnosis includes, but is not limited to, ACS, arrhythmia, vasovagal episode, anemia, electrolyte abnormality, AKI.  Patient well-appearing and in no acute distress, vital signs initially with hypertension however blood pressure improved to normal range without intervention.  EKG shows no evidence of arrhythmia or ischemia and 2 sets of troponin are within normal limits, doubt  ACS.  Additional labs show no significant anemia, leukocytosis, tract abnormality, or AKI.  Chest x-ray is also unremarkable.  No evidence of hypertensive emergency and cardiac workup has been unremarkable, patient appropriate for outpatient management.  She was counseled to return to the ED for new or worsening symptoms, patient agrees with plan.      FINAL CLINICAL IMPRESSION(S) / ED DIAGNOSES   Final diagnoses:  Lightheadedness  Atypical chest pain     Rx / DC Orders   ED Discharge Orders     None        Note:  This document was prepared using Dragon voice recognition software and may include unintentional dictation errors.   Chesley Noon, MD 02/23/23 [phone]

## 2023-02-23 NOTE — ED Notes (Signed)
..  The patient is A&OX4, ambulatory at d/c with independent steady gait, NAD. Pt verbalized understanding of d/c instructions and follow up care.

## 2023-02-25 ENCOUNTER — Ambulatory Visit: Payer: BC Managed Care – PPO | Admitting: Family Medicine

## 2023-02-25 VITALS — BP 118/74 | HR 81 | Temp 98.1°F | Wt 208.4 lb

## 2023-02-25 DIAGNOSIS — F411 Generalized anxiety disorder: Secondary | ICD-10-CM | POA: Diagnosis not present

## 2023-02-25 DIAGNOSIS — E876 Hypokalemia: Secondary | ICD-10-CM | POA: Diagnosis not present

## 2023-02-25 DIAGNOSIS — I10 Essential (primary) hypertension: Secondary | ICD-10-CM

## 2023-02-25 DIAGNOSIS — R739 Hyperglycemia, unspecified: Secondary | ICD-10-CM

## 2023-02-25 DIAGNOSIS — G4700 Insomnia, unspecified: Secondary | ICD-10-CM | POA: Diagnosis not present

## 2023-02-25 DIAGNOSIS — Z6832 Body mass index (BMI) 32.0-32.9, adult: Secondary | ICD-10-CM

## 2023-02-25 MED ORDER — FLUOXETINE HCL 40 MG PO CAPS
40.0000 mg | ORAL_CAPSULE | Freq: Every day | ORAL | 1 refills | Status: DC
Start: 2023-02-25 — End: 2023-07-29

## 2023-02-25 MED ORDER — WEGOVY 0.25 MG/0.5ML ~~LOC~~ SOAJ
0.2500 mg | SUBCUTANEOUS | 1 refills | Status: DC
Start: 2023-02-25 — End: 2023-03-29

## 2023-02-25 MED ORDER — LOSARTAN POTASSIUM 25 MG PO TABS
25.0000 mg | ORAL_TABLET | Freq: Every day | ORAL | 1 refills | Status: DC
Start: 2023-02-25 — End: 2023-07-29

## 2023-02-25 MED ORDER — HYDROXYZINE HCL 10 MG PO TABS
10.0000 mg | ORAL_TABLET | Freq: Three times a day (TID) | ORAL | 0 refills | Status: DC | PRN
Start: 2023-02-25 — End: 2023-05-28

## 2023-02-25 NOTE — Patient Instructions (Addendum)
Thank you for coming in today.  I will recheck some of the labs from the ER.  Blood pressure looks okay on recheck today.  If you are checking your blood pressure at home, make sure you are using the technique below to make sure you are obtaining the correct data.  No change in losartan dose for now.  Will recheck in 1 month.  Will continue fluoxetine same dose for now, but I did write for hydroxyzine if needed for breakthrough anxiety or difficulty sleeping.  I would like to discuss that further in 1 month.  If any worsening symptoms be seen sooner.  As we discussed I do think it would be very helpful for you to meet with her therapist to discuss your current stressors and management of the stressors.  See information below on managing stress and anxiety as well.  If we do need to complete FMLA paperwork for your medical visits, that is certainly reasonable.  Let us know.  I did restart the Wegovy low-dose, and if that needs to have Prothero sedation, we will work through those steps to see if we can get it covered.  Again recheck in 1 month.  Hang in there.  Return to the clinic or go to the nearest emergency room if any of your symptoms worsen or new symptoms occur.   How to Take Your Blood Pressure Blood pressure is a measurement of how strongly your blood is pressing against the walls of your arteries. Arteries are blood vessels that carry blood from your heart throughout your body. Your health care provider takes your blood pressure at each office visit. You can also take your own blood pressure at home with a blood pressure monitor. You may need to take your own blood pressure to: Confirm a diagnosis of high blood pressure (hypertension). Monitor your blood pressure over time. Make sure your blood pressure medicine is working. Supplies needed: Blood pressure monitor. A chair to sit in. This should be a chair where you can sit upright with your back supported. Do not sit on a soft couch or  an armchair. Table or desk. Small notebook and pencil or pen. How to prepare To get the most accurate reading, avoid the following for 30 minutes before you check your blood pressure: Drinking caffeine. Drinking alcohol. Eating. Smoking. Exercising. Five minutes before you check your blood pressure: Use the bathroom and urinate so that you have an empty bladder. Sit quietly in a chair. Do not talk. How to take your blood pressure To check your blood pressure, follow the instructions in the manual that came with your blood pressure monitor. If you have a digital blood pressure monitor, the instructions may be as follows: Sit up straight in a chair. Place your feet on the floor. Do not cross your ankles or legs. Rest your left arm at the level of your heart on a table or desk or on the arm of a chair. Pull up your shirt sleeve. Wrap the blood pressure cuff around the upper part of your left arm, 1 inch (2.5 cm) above your elbow. It is best to wrap the cuff around bare skin. Fit the cuff snugly, but not too tightly, around your arm. You should be able to place only one finger between the cuff and your arm. Position the cord so that it rests in the bend of your elbow. Press the power button. Sit quietly while the cuff inflates and deflates. Read the digital reading on the monitor screen  and write the numbers down (record them) in a notebook. Wait 2-3 minutes, then repeat the steps, starting at step 1. What does my blood pressure reading mean? A blood pressure reading consists of a higher number over a lower number. Ideally, your blood pressure should be below 120/80. The first ("top") number is called the systolic pressure. It is a measure of the pressure in your arteries as your heart beats. The second ("bottom") number is called the diastolic pressure. It is a measure of the pressure in your arteries as the heart relaxes. Blood pressure is classified into four stages. The following are the  stages for adults who do not have a short-term serious illness or a chronic condition. Systolic pressure and diastolic pressure are measured in a unit called mm Hg (millimeters of mercury).  Normal Systolic pressure: below 120. Diastolic pressure: below 80. Elevated Systolic pressure: 120-129. Diastolic pressure: below 80. Hypertension stage 1 Systolic pressure: 130-139. Diastolic pressure: 80-89. Hypertension stage 2 Systolic pressure: 140 or above. Diastolic pressure: 90 or above. You can have elevated blood pressure or hypertension even if only the systolic or only the diastolic number in your reading is higher than normal. Follow these instructions at home: Medicines Take over-the-counter and prescription medicines only as told by your health care provider. Tell your health care provider if you are having any side effects from blood pressure medicine. General instructions Check your blood pressure as often as recommended by your health care provider. Check your blood pressure at the same time every day. Take your monitor to the next appointment with your health care provider to make sure that: You are using it correctly. It provides accurate readings. Understand what your goal blood pressure numbers are. Keep all follow-up visits. This is important. General tips Your health care provider can suggest a reliable monitor that will meet your needs. There are several types of home blood pressure monitors. Choose a monitor that has an arm cuff. Do not choose a monitor that measures your blood pressure from your wrist or finger. Choose a cuff that wraps snugly, not too tight or too loose, around your upper arm. You should be able to fit only one finger between your arm and the cuff. You can buy a blood pressure monitor at most drugstores or online. Where to find more information American Heart Association: www.heart.org Contact a health care provider if: Your blood pressure is  consistently high. Your blood pressure is suddenly low. Get help right away if: Your systolic blood pressure is higher than 180. Your diastolic blood pressure is higher than 120. These symptoms may be an emergency. Get help right away. Call 911. Do not wait to see if the symptoms will go away. Do not drive yourself to the hospital. Summary Blood pressure is a measurement of how strongly your blood is pressing against the walls of your arteries. A blood pressure reading consists of a higher number over a lower number. Ideally, your blood pressure should be below 120/80. Check your blood pressure at the same time every day. Avoid caffeine, alcohol, smoking, and exercise for 30 minutes prior to checking your blood pressure. These agents can affect the accuracy of the blood pressure reading. This information is not intended to replace advice given to you by your health care provider. Make sure you discuss any questions you have with your health care provider. Document Revised: 01/30/2021 Document Reviewed: 01/30/2021 Elsevier Patient Education  2024 Elsevier Inc.  Managing Anxiety, Adult After being diagnosed with anxiety,  you may be relieved to know why you have felt or behaved a certain way. You may also feel overwhelmed about the treatment ahead and what it will mean for your life. With care and support, you can manage your anxiety. How to manage lifestyle changes Understanding the difference between stress and anxiety Although stress can play a role in anxiety, it is not the same as anxiety. Stress is your body's reaction to life changes and events, both good and bad. Stress is often caused by something external, such as a deadline, test, or competition. It normally goes away after the event has ended and will last just a few hours. But, stress can be ongoing and can lead to more than just stress. Anxiety is caused by something internal, such as imagining a terrible outcome or worrying that  something will go wrong that will greatly upset you. Anxiety often does not go away even after the event is over, and it can become a long-term (chronic) worry. Lowering stress and anxiety Talk with your health care provider or a counselor to learn more about lowering anxiety and stress. They may suggest tension-reduction techniques, such as: Music. Spend time creating or listening to music that you enjoy and that inspires you. Mindfulness-based meditation. Practice being aware of your normal breaths while not trying to control your breathing. It can be done while sitting or walking. Centering prayer. Focus on a word, phrase, or sacred image that means something to you and brings you peace. Deep breathing. Expand your stomach and inhale slowly through your nose. Hold your breath for 3-5 seconds. Then breathe out slowly, letting your stomach muscles relax. Self-talk. Learn to notice and spot thought patterns that lead to anxiety reactions. Change those patterns to thoughts that feel peaceful. Muscle relaxation. Take time to tense muscles and then relax them. Choose a tension-reduction technique that fits your lifestyle and personality. These techniques take time and practice. Set aside 5-15 minutes a day to do them. Specialized therapists can offer counseling and training in these techniques. The training to help with anxiety may be covered by some insurance plans. Other things you can do to manage stress and anxiety include: Keeping a stress diary. This can help you learn what triggers your reaction and then learn ways to manage your response. Thinking about how you react to certain situations. You may not be able to control everything, but you can control your response. Making time for activities that help you relax and not feeling guilty about spending your time in this way. Doing visual imagery. This involves imagining or creating mental pictures to help you relax. Practicing yoga. Through yoga  poses, you can lower tension and relax.  Medicines Medicines for anxiety include: Antidepressant medicines. These are usually prescribed for long-term daily control. Anti-anxiety medicines. These may be added in severe cases, especially when panic attacks occur. When used together, medicines, psychotherapy, and tension-reduction techniques may be the most effective treatment. Relationships Relationships can play a big part in helping you recover. Spend more time connecting with trusted friends and family members. Think about going to couples counseling if you have a partner, taking family education classes, or going to family therapy. Therapy can help you and others better understand your anxiety. How to recognize changes in your anxiety Everyone responds differently to treatment for anxiety. Recovery from anxiety happens when symptoms lessen and stop interfering with your daily life at home or work. This may mean that you will start to: Have better concentration and  focus. Worry will interfere less in your daily thinking. Sleep better. Be less irritable. Have more energy. Have improved memory. Try to recognize when your condition is getting worse. Contact your provider if your symptoms interfere with home or work and you feel like your condition is not improving. Follow these instructions at home: Activity Exercise. Adults should: Exercise for at least 150 minutes each week. The exercise should increase your heart rate and make you sweat (moderate-intensity exercise). Do strengthening exercises at least twice a week. Get the right amount and quality of sleep. Most adults need 7-9 hours of sleep each night. Lifestyle  Eat a healthy diet that includes plenty of vegetables, fruits, whole grains, low-fat dairy products, and lean protein. Do not eat a lot of foods that are high in fats, added sugars, or salt (sodium). Make choices that simplify your life. Do not use any products that contain  nicotine or tobacco. These products include cigarettes, chewing tobacco, and vaping devices, such as e-cigarettes. If you need help quitting, ask your provider. Avoid caffeine, alcohol, and certain over-the-counter cold medicines. These may make you feel worse. Ask your pharmacist which medicines to avoid. General instructions Take over-the-counter and prescription medicines only as told by your provider. Keep all follow-up visits. This is to make sure you are managing your anxiety well or if you need more support. Where to find support You can get help and support from: Self-help groups. Online and [employer]. A trusted spiritual leader. Couples counseling. Family education classes. Family therapy. Where to find more information You may find that joining a support group helps you deal with your anxiety. The following sources can help you find counselors or support groups near you: Mental Health America: mentalhealthamerica.net Anxiety and Depression Association of [place] (ADAA): adaa.org The [employer] on Mental Illness (NAMI): nami.org Contact a health care provider if: You have a hard time staying focused or finishing tasks. You spend many hours a day feeling worried about everyday life. You are very tired because you cannot stop worrying. You start to have headaches or often feel tense. You have chronic nausea or diarrhea. Get help right away if: Your heart feels like it is racing. You have shortness of breath. You have thoughts of hurting yourself or others. Get help right away if you feel like you may hurt yourself or others, or have thoughts about taking your own life. Go to your nearest emergency room or: Call 911. Call the National Suicide Prevention Lifeline at (201)203-4405 or 988. This is open 24 hours a day. Text the Crisis Text Line at 239-432-6806. This information is not intended to replace advice given to you by your health care provider. Make sure you  discuss any questions you have with your health care provider. Document Revised: 02/24/2022 Document Reviewed: 09/08/2020 Elsevier Patient Education  2024 Elsevier Inc.  Insomnia Insomnia is a sleep disorder that makes it difficult to fall asleep or stay asleep. Insomnia can cause fatigue, low energy, difficulty concentrating, mood swings, and poor performance at work or school. There are three different ways to classify insomnia: Difficulty falling asleep. Difficulty staying asleep. Waking up too early in the morning. Any type of insomnia can be long-term (chronic) or short-term (acute). Both are common. Short-term insomnia usually lasts for 3 months or less. Chronic insomnia occurs at least three times a week for longer than 3 months. What are the causes? Insomnia may be caused by another condition, situation, or substance, such as: Having certain mental health conditions, such  as anxiety and depression. Using caffeine, alcohol, tobacco, or drugs. Having gastrointestinal conditions, such as gastroesophageal reflux disease (GERD). Having certain medical conditions. These include: Asthma. Alzheimer's disease. Stroke. Chronic pain. An overactive thyroid gland (hyperthyroidism). Other sleep disorders, such as restless legs syndrome and sleep apnea. Menopause. Sometimes, the cause of insomnia may not be known. What increases the risk? Risk factors for insomnia include: Gender. Females are affected more often than males. Age. Insomnia is more common as people get older. Stress and certain medical and mental health conditions. Lack of exercise. Having an irregular work schedule. This may include working night shifts and traveling between different time zones. What are the signs or symptoms? If you have insomnia, the main symptom is having trouble falling asleep or having trouble staying asleep. This may lead to other symptoms, such as: Feeling tired or having low energy. Feeling  nervous about going to sleep. Not feeling rested in the morning. Having trouble concentrating. Feeling irritable, anxious, or depressed. How is this diagnosed? This condition may be diagnosed based on: Your symptoms and medical history. Your health care provider may ask about: Your sleep habits. Any medical conditions you have. Your mental health. A physical exam. How is this treated? Treatment for insomnia depends on the cause. Treatment may focus on treating an underlying condition that is causing the insomnia. Treatment may also include: Medicines to help you sleep. Counseling or therapy. Lifestyle adjustments to help you sleep better. Follow these instructions at home: Eating and drinking  Limit or avoid alcohol, caffeinated beverages, and products that contain nicotine and tobacco, especially close to bedtime. These can disrupt your sleep. Do not eat a large meal or eat spicy foods right before bedtime. This can lead to digestive discomfort that can make it hard for you to sleep. Sleep habits  Keep a sleep diary to help you and your health care provider figure out what could be causing your insomnia. Write down: When you sleep. When you wake up during the night. How well you sleep and how rested you feel the next day. Any side effects of medicines you are taking. What you eat and drink. Make your bedroom a dark, comfortable place where it is easy to fall asleep. Put up shades or blackout curtains to block light from outside. Use a white noise machine to block noise. Keep the temperature cool. Limit screen use before bedtime. This includes: Not watching TV. Not using your smartphone, tablet, or computer. Stick to a routine that includes going to bed and waking up at the same times every day and night. This can help you fall asleep faster. Consider making a quiet activity, such as reading, part of your nighttime routine. Try to avoid taking naps during the day so that you  sleep better at night. Get out of bed if you are still awake after 15 minutes of trying to sleep. Keep the lights down, but try reading or doing a quiet activity. When you feel sleepy, go back to bed. General instructions Take over-the-counter and prescription medicines only as told by your health care provider. Exercise regularly as told by your health care provider. However, avoid exercising in the hours right before bedtime. Use relaxation techniques to manage stress. Ask your health care provider to suggest some techniques that may work well for you. These may include: Breathing exercises. Routines to release muscle tension. Visualizing peaceful scenes. Make sure that you drive carefully. Do not drive if you feel very sleepy. Keep all follow-up visits. This  is important. Contact a health care provider if: You are tired throughout the day. You have trouble in your daily routine due to sleepiness. You continue to have sleep problems, or your sleep problems get worse. Get help right away if: You have thoughts about hurting yourself or someone else. Get help right away if you feel like you may hurt yourself or others, or have thoughts about taking your own life. Go to your nearest emergency room or: Call 911. Call the National Suicide Prevention Lifeline at (805)236-9290 or 988. This is open 24 hours a day. Text the Crisis Text Line at 6827705065. Summary Insomnia is a sleep disorder that makes it difficult to fall asleep or stay asleep. Insomnia can be long-term (chronic) or short-term (acute). Treatment for insomnia depends on the cause. Treatment may focus on treating an underlying condition that is causing the insomnia. Keep a sleep diary to help you and your health care provider figure out what could be causing your insomnia. This information is not intended to replace advice given to you by your health care provider. Make sure you discuss any questions you have with your health care  provider. Document Revised: 04/28/2021 Document Reviewed: 04/28/2021 Elsevier Patient Education  2024 Elsevier Inc.  Managing Stress, Adult Feeling a certain amount of stress is normal. Stress helps our body and mind get ready to deal with the demands of life. Stress hormones can motivate you to do well at work and meet your responsibilities. But severe or long-term (chronic) stress can affect your mental and physical health. Chronic stress puts you at higher risk for: Anxiety and depression. Other health problems such as digestive problems, muscle aches, heart disease, high blood pressure, and stroke. What are the causes? Common causes of stress include: Demands from work, such as deadlines, feeling overworked, or having long hours. Pressures at home, such as money issues, disagreements with a spouse, or parenting issues. Pressures from major life changes, such as divorce, moving, loss of a loved one, or chronic illness. You may be at higher risk for stress-related problems if you: Do not get enough sleep. Are in poor health. Do not have emotional support. Have a mental health disorder such as anxiety or depression. How to recognize stress Stress can make you: Have trouble sleeping. Feel sad, anxious, irritable, or overwhelmed. Lose your appetite. Overeat or want to eat unhealthy foods. Want to use drugs or alcohol. Stress can also cause physical symptoms, such as: Sore, tense muscles, especially in the shoulders and neck. Headaches. Trouble breathing. A faster heart rate. Stomach pain, nausea, or vomiting. Diarrhea or constipation. Trouble concentrating. Follow these instructions at home: Eating and drinking Eat a healthy diet. This includes: Eating foods that are high in fiber, such as beans, whole grains, and fresh fruits and vegetables. Limiting foods that are high in fat and processed sugars, such as fried or sweet foods. Do not skip meals or overeat. Drink enough fluid  to keep your urine pale yellow. Alcohol use Do not drink alcohol if: Your health care provider tells you not to drink. You are pregnant, may be pregnant, or are planning to become pregnant. Drinking alcohol is a way some people try to ease their stress. This can be dangerous, so if you drink alcohol: Limit how much you have to: 0-1 drink a day for women. 0-2 drinks a day for men. Know how much alcohol is in your drink. In the [place] one drink equals one 12 oz bottle of beer (355 mL), one  5 oz glass of wine (148 mL), or one 1 oz glass of hard liquor (44 mL). Activity  Include 30 minutes of exercise in your daily schedule. Exercise is a good stress reducer. Include time in your day for an activity that you find relaxing. Try taking a walk, going on a bike ride, reading a book, or listening to music. Schedule your time in a way that lowers stress, and keep a regular schedule. Focus on doing what is most important to get done. Lifestyle Identify the source of your stress and your reaction to it. See a therapist who can help you change unhelpful reactions. When there are stressful events: Talk about them with family, friends, or coworkers. Try to think realistically about stressful events and not ignore them or overreact. Try to find the positives in a stressful situation and not focus on the negatives. Cut back on responsibilities at work and home, if possible. Ask for help from friends or family members if you need it. Find ways to manage stress, such as: Mindfulness, meditation, or deep breathing. Yoga or tai chi. Progressive muscle relaxation. Spending time in nature. Doing art, playing music, or reading. Making time for fun activities. Spending time with family and friends. Get support from family, friends, or spiritual resources. General instructions Get enough sleep. Try to go to sleep and get up at about the same time every day. Take over-the-counter and prescription medicines  only as told by your health care provider. Do not use any products that contain nicotine or tobacco. These products include cigarettes, chewing tobacco, and vaping devices, such as e-cigarettes. If you need help quitting, ask your health care provider. Do not use drugs or smoke to deal with stress. Keep all follow-up visits. This is important. Where to find support Talk with your health care provider about stress management or finding a support group. Find a therapist to work with you on your stress management techniques. Where to find more information The [employer] on Mental Illness: www.nami.org American Psychological Association: D[employer].ca Contact a health care provider if: Your stress symptoms get worse. You are unable to manage your stress at home. You are struggling to stop using drugs or alcohol. Get help right away if: You may be a danger to yourself or others. You have any thoughts of death or suicide. Get help right awayif you feel like you may hurt yourself or others, or have thoughts about taking your own life. Go to your nearest emergency room or: Call 911. Call the National Suicide Prevention Lifeline at 650-627-7738 or 988 in the [place] This is open 24 hours a day. Text the Crisis Text Line at 970-004-4770. Summary Feeling a certain amount of stress is normal, but severe or long-term (chronic) stress can affect your mental and physical health. Chronic stress can put you at higher risk for anxiety, depression, and other health problems such as digestive problems, muscle aches, heart disease, high blood pressure, and stroke. You may be at higher risk for stress-related problems if you do not get enough sleep, are in poor health, lack emotional support, or have a mental health disorder such as anxiety or depression. Identify the source of your stress and your reaction to it. Try talking about stressful events with family, friends, or coworkers, finding a coping method, or  getting support from spiritual resources. If you need more help, talk with your health care provider about finding a support group or a mental health therapist. This information is not intended to replace advice  given to you by your health care provider. Make sure you discuss any questions you have with your health care provider. Document Revised: 12/12/2020 Document Reviewed: 12/10/2020 Elsevier Patient Education  2024 [employer].

## 2023-02-26 ENCOUNTER — Telehealth: Payer: Self-pay | Admitting: Family Medicine

## 2023-02-26 ENCOUNTER — Telehealth: Payer: Self-pay

## 2023-02-26 LAB — BASIC METABOLIC PANEL
BUN: 10 mg/dL (ref 6–23)
CO2: 25 meq/L (ref 19–32)
Calcium: 9.5 mg/dL (ref 8.4–10.5)
Chloride: 102 meq/L (ref 96–112)
Creatinine, Ser: 0.7 mg/dL (ref 0.40–1.20)
GFR: 98.75 mL/min (ref >60.00)
Glucose, Bld: 82 mg/dL (ref 70–99)
Potassium: 4.2 meq/L (ref 3.5–5.1)
Sodium: 136 meq/L (ref 135–145)

## 2023-02-26 LAB — HEMOGLOBIN A1C: Hgb A1c MFr Bld: 5.1 % (ref 4.6–6.5)

## 2023-02-26 NOTE — Telephone Encounter (Signed)
PA request has been Submitted. New Encounter created for follow up. For additional info see Pharmacy Prior Auth telephone encounter from 02/26/23.

## 2023-02-26 NOTE — Telephone Encounter (Signed)
Pt wanted to let you know that Reginal Lutes has to go through a PA before she can pick it up.

## 2023-02-26 NOTE — Telephone Encounter (Signed)
Patietn called and let us know she is in need of a PA for wegovy if you could start that please and thank you.

## 2023-02-26 NOTE — Telephone Encounter (Signed)
Pharmacy Patient Advocate Encounter   Received notification from Pt Calls Messages that prior authorization for Wegovy 0.25MG /0.5ML auto-injectors is required/requested.   Insurance verification completed.   The patient is insured through [facility] .   Per test claim: PA required; PA submitted to [employer] via CoverMyMeds Key/confirmation #/EOC [facility] Status is pending

## 2023-02-27 ENCOUNTER — Encounter: Payer: Self-pay | Admitting: Family Medicine

## 2023-03-01 ENCOUNTER — Other Ambulatory Visit (HOSPITAL_COMMUNITY): Payer: Self-pay

## 2023-03-01 NOTE — Telephone Encounter (Signed)
Pharmacy Patient Advocate Encounter  Received notification from [facility] that Prior Authorization for [facility] 0.25MG /0.5ML auto-injector has been APPROVED from 02/26/23 to 07/02/23. Ran test claim, Copay is $24.99. This test claim was processed through [facility]- copay amounts may vary at other pharmacies due to pharmacy/plan contracts, or as the patient moves through the different stages of their insurance plan.   PA #/Case ID/Reference #: [id number]

## 2023-03-01 NOTE — Telephone Encounter (Signed)
Pt has been notified.

## 2023-03-29 ENCOUNTER — Other Ambulatory Visit (HOSPITAL_COMMUNITY): Payer: Self-pay

## 2023-03-29 ENCOUNTER — Ambulatory Visit: Payer: BC Managed Care – PPO | Admitting: Family Medicine

## 2023-03-29 VITALS — BP 128/78 | HR 83 | Temp 97.6°F | Ht 67.0 in | Wt 201.8 lb

## 2023-03-29 DIAGNOSIS — F411 Generalized anxiety disorder: Secondary | ICD-10-CM

## 2023-03-29 DIAGNOSIS — G4700 Insomnia, unspecified: Secondary | ICD-10-CM

## 2023-03-29 DIAGNOSIS — Z6831 Body mass index (BMI) 31.0-31.9, adult: Secondary | ICD-10-CM

## 2023-03-29 DIAGNOSIS — I10 Essential (primary) hypertension: Secondary | ICD-10-CM

## 2023-03-29 DIAGNOSIS — E66811 Obesity, class 1: Secondary | ICD-10-CM | POA: Diagnosis not present

## 2023-03-29 MED ORDER — WEGOVY 0.5 MG/0.5ML ~~LOC~~ SOAJ
0.5000 mg | SUBCUTANEOUS | 1 refills | Status: DC
Start: 2023-03-29 — End: 2023-05-28
  Filled 2023-03-29 – 2023-04-23 (×5): qty 2, 28d supply, fill #0
  Filled 2023-05-16: qty 2, 28d supply, fill #1

## 2023-03-29 NOTE — Progress Notes (Unsigned)
Subjective:  Patient ID: Brittney Bell, female    DOB: 26-Oct-1970  Age: 52 y.o. MRN: [id number]  CC:  Chief Complaint  Patient presents with   Weight Loss    Notes she is due for a refill after this next dose and pt doing well no side effect noted    Hypertension    Pt doing okay no physical sxs.     HPI Brittney Bell presents for    Hypertension with anxiety. Follow-up from September 26 visit.  Had been seen in the ER 2 days prior, anxious at that time, elevated blood pressure reportedly 188/100 prior to her usual evening dose of losartan.  Improved during ER notes with no ongoing lightheadedness, borderline hypokalemia at 3.3, negative troponin x 2 and normal CBC.  Was continued on losartan 25 mg daily.  Increased anxiety past few months with stressors as well as work.  Had not met with a therapist recently, recommended to pursue that route, phone numbers provided if she chose to pursue counseling.  She decided to remain on same dose of fluoxetine at this time, low-dose hydroxyzine was provided for sleep or breakthrough anxiety.  Blood pressure was controlled at last office visit.  Continued on same dose losartan.  Handout given on management of anxiety.  Home readings: 120's.  Has been managing stress better, managing boundaries with work. Few days off in November for her partner's surgery. Has one of the house sold that was a stressor.  Only used hydroxyzine one weekend to sleep. Worked well.  Plans to reach out to counseling.   BP Readings from Last 3 Encounters:  03/29/23 128/78  02/25/23 118/74  02/23/23 114/77   Lab Results  Component Value Date   CREATININE 0.70 02/25/2023   Lab Results  Component Value Date   NA 136 02/25/2023   K 4.2 02/25/2023   CL 102 02/25/2023   CO2 25 02/25/2023   Obesity Previously had been treated with semaglutide, weight had been up to 224 previously then down to 183 when on medication weaned off, cost prohibitive previously.   Reported that Reginal Lutes was now approved with her insurance at her September 26 visit.  Stress eating discussed. Weight 208 as September 26 visit. No n/v.abd pain with Wegovy- on 4th week of 0.25mg , no new side effects. Has lost 7#. Would like to increase to 0.5mg  dose.  Wt Readings from Last 3 Encounters:  03/29/23 201 lb 12.8 oz (91.5 kg)  02/25/23 208 lb 6.4 oz (94.5 kg)  02/22/23 209 lb (94.8 kg)        History Patient Active Problem List   Diagnosis Date Noted   Major depressive disorder    Low back pain    Generalized anxiety disorder 05/20/2017   Essential hypertension, benign 05/15/2017   Past Medical History:  Diagnosis Date   Cellulitis and abscess of face 01/29/2013   Essential hypertension, benign 05/15/2017   Generalized anxiety disorder 05/20/2017   Low back pain    Major depressive disorder    Past Surgical History:  Procedure Laterality Date   BREAST CYST EXCISION Right 03/05/2020   Procedure: Excision of right nipple cyst;  Surgeon: Manus Rudd, MD;  Location: [facility];  Service: General;  Laterality: Right;  lma   TUBAL LIGATION     Allergies  Allergen Reactions   Azithromycin Hives   Tizanidine Other (See Comments)    Hypotension, severe 80/50s   Levaquin [Levofloxacin] Palpitations   Prior to Admission medications  Medication Sig Start Date End Date Taking? Authorizing Provider  FLUoxetine (PROZAC) 40 MG capsule Take 1 capsule (40 mg total) by mouth daily. 02/25/23   Shade Flood, MD  hydrOXYzine (ATARAX) 10 MG tablet Take 1 tablet (10 mg total) by mouth 3 (three) times daily as needed for anxiety. Or sleep 02/25/23   Shade Flood, MD  ipratropium (ATROVENT) 0.03 % nasal spray Place 2 sprays into both nostrils every 12 (twelve) hours. 06/24/22   White, Elita Boone, NP  losartan (COZAAR) 25 MG tablet Take 1 tablet (25 mg total) by mouth daily. 02/25/23   Shade Flood, MD  Semaglutide-Weight Management (WEGOVY) 0.25 MG/0.5ML  SOAJ Inject 0.25 mg into the skin once a week. 02/25/23   Shade Flood, MD   Social History   Socioeconomic History   Marital status: Widowed    Spouse name: Not on file   Number of children: Not on file   Years of education: 13   Highest education level: GED or equivalent  Occupational History   Not on file  Tobacco Use   Smoking status: Former    Current packs/day: 0.00    Types: Cigarettes    Quit date: 11/30/2015    Years since quitting: 7.3   Smokeless tobacco: Never  Substance and Sexual Activity   Alcohol use: Yes    Alcohol/week: 0.0 standard drinks of alcohol    Comment: socially   Drug use: No   Sexual activity: Yes  Other Topics Concern   Not on file  Social History Narrative   Right handed   Lives alone   Social Determinants of Health   Financial Resource Strain: Low Risk  (02/25/2023)   Overall Financial Resource Strain (CARDIA)    Difficulty of Paying Living Expenses: Not very hard  Food Insecurity: Unknown (02/25/2023)   Hunger Vital Sign    Worried About Running Out of Food in the Last Year: Patient declined    Ran Out of Food in the Last Year: Never true  Transportation Needs: No Transportation Needs (02/25/2023)   PRAPARE - Administrator, Civil Service (Medical): No    Lack of Transportation (Non-Medical): No  Physical Activity: Insufficiently Active (02/25/2023)   Exercise Vital Sign    Days of Exercise per Week: 3 days    Minutes of Exercise per Session: 10 min  Stress: Stress Concern Present (02/25/2023)   [employer] of Occupational Health - Occupational Stress Questionnaire    Feeling of Stress : To some extent  Social Connections: Socially Integrated (02/25/2023)   Social Connection and Isolation Panel [NHANES]    Frequency of Communication with Friends and Family: More than three times a week    Frequency of Social Gatherings with Friends and Family: Once a week    Attends Religious Services: More than 4 times per year     Active Member of [employer] or Organizations: Yes    Attends Banker Meetings: 1 to 4 times per year    Marital Status: Living with partner  Intimate Partner Violence: Not on file    Review of Systems  Constitutional:  Negative for fatigue and unexpected weight change.  Respiratory:  Negative for chest tightness and shortness of breath.   Cardiovascular:  Negative for chest pain, palpitations and leg swelling.  Gastrointestinal:  Negative for abdominal pain and blood in stool.  Neurological:  Negative for dizziness, syncope, light-headedness and headaches.     Objective:   Vitals:   03/29/23 1456  BP: 128/78  Pulse: 83  Temp: 97.6 F (36.4 C)  TempSrc: Temporal  SpO2: 97%  Weight: 201 lb 12.8 oz (91.5 kg)  Height: 5\' 7"  (1.702 m)     Physical Exam Vitals reviewed.  Constitutional:      Appearance: Normal appearance. She is well-developed.  HENT:     Head: Normocephalic and atraumatic.  Eyes:     Conjunctiva/sclera: Conjunctivae normal.     Pupils: Pupils are equal, round, and reactive to light.  Neck:     Vascular: No carotid bruit.  Cardiovascular:     Rate and Rhythm: Normal rate and regular rhythm.     Heart sounds: Normal heart sounds.  Pulmonary:     Effort: Pulmonary effort is normal.     Breath sounds: Normal breath sounds.  Abdominal:     Palpations: Abdomen is soft. There is no pulsatile mass.     Tenderness: There is no abdominal tenderness.  Musculoskeletal:     Right lower leg: No edema.     Left lower leg: No edema.  Skin:    General: Skin is warm and dry.  Neurological:     Mental Status: She is alert and oriented to person, place, and time.  Psychiatric:        Mood and Affect: Mood normal.        Behavior: Behavior normal.        Assessment & Plan:  Brittney Bell is a 52 y.o. female . Class 1 obesity with body mass index (BMI) of 31.0 to 31.9 in adult, unspecified obesity type, unspecified whether serious comorbidity  present - Plan: Semaglutide-Weight Management (WEGOVY) 0.5 MG/0.5ML SOAJ  Generalized anxiety disorder  Insomnia, unspecified type  Essential hypertension, benign   Meds ordered this encounter  Medications   Semaglutide-Weight Management (WEGOVY) 0.5 MG/0.5ML SOAJ    Sig: Inject 0.5 mg into the skin once a week.    Dispense:  2 mL    Refill:  1    New dose.   Patient Instructions  Congratulations on the weight loss.  We can try higher dose of 0.5 mg Wegovy and recheck in 1 month.  If any nausea, or side effects at the higher dose return to the 0.25 mg.  Blood pressure looks good today, no change in medicine. Glad to hear that stress management has improved.  Hydroxyzine if needed for sleeping and I do recommend follow-up with therapist as we discussed.  I do think that will be helpful.  I will see you in 1 month, let me know if there are questions in the meantime and take care!    Signed,   Meredith Staggers, MD  Primary Care, [facility] 03/29/23 3:37 PM

## 2023-03-29 NOTE — Patient Instructions (Signed)
Congratulations on the weight loss.  We can try higher dose of 0.5 mg Wegovy and recheck in 1 month.  If any nausea, or side effects at the higher dose return to the 0.25 mg.  Blood pressure looks good today, no change in medicine. Glad to hear that stress management has improved.  Hydroxyzine if needed for sleeping and I do recommend follow-up with therapist as we discussed.  I do think that will be helpful.  I will see you in 1 month, let me know if there are questions in the meantime and take care!

## 2023-04-21 ENCOUNTER — Other Ambulatory Visit (HOSPITAL_COMMUNITY): Payer: Self-pay

## 2023-04-22 ENCOUNTER — Other Ambulatory Visit (HOSPITAL_COMMUNITY): Payer: Self-pay

## 2023-04-23 ENCOUNTER — Other Ambulatory Visit: Payer: Self-pay

## 2023-04-23 ENCOUNTER — Other Ambulatory Visit (HOSPITAL_COMMUNITY): Payer: Self-pay

## 2023-04-28 ENCOUNTER — Ambulatory Visit: Payer: BC Managed Care – PPO | Admitting: Family Medicine

## 2023-05-17 ENCOUNTER — Other Ambulatory Visit: Payer: Self-pay

## 2023-05-18 ENCOUNTER — Other Ambulatory Visit: Payer: Self-pay

## 2023-05-21 ENCOUNTER — Other Ambulatory Visit: Payer: Self-pay

## 2023-05-28 ENCOUNTER — Ambulatory Visit: Payer: BC Managed Care – PPO | Admitting: Family Medicine

## 2023-05-28 ENCOUNTER — Other Ambulatory Visit (HOSPITAL_COMMUNITY): Payer: Self-pay

## 2023-05-28 VITALS — BP 122/70 | HR 69 | Temp 98.1°F | Ht 67.0 in | Wt 192.4 lb

## 2023-05-28 DIAGNOSIS — E66811 Obesity, class 1: Secondary | ICD-10-CM | POA: Diagnosis not present

## 2023-05-28 DIAGNOSIS — Z23 Encounter for immunization: Secondary | ICD-10-CM | POA: Diagnosis not present

## 2023-05-28 DIAGNOSIS — Z6831 Body mass index (BMI) 31.0-31.9, adult: Secondary | ICD-10-CM

## 2023-05-28 DIAGNOSIS — F411 Generalized anxiety disorder: Secondary | ICD-10-CM | POA: Diagnosis not present

## 2023-05-28 DIAGNOSIS — G4700 Insomnia, unspecified: Secondary | ICD-10-CM

## 2023-05-28 MED ORDER — WEGOVY 0.5 MG/0.5ML ~~LOC~~ SOAJ
0.5000 mg | SUBCUTANEOUS | 1 refills | Status: DC
  Filled 2023-05-28: qty 2, 28d supply, fill #0

## 2023-05-28 MED ORDER — HYDROXYZINE HCL 10 MG PO TABS
10.0000 mg | ORAL_TABLET | Freq: Every evening | ORAL | 0 refills | Status: DC | PRN
  Filled 2023-05-28: qty 30, 15d supply, fill #0

## 2023-05-28 NOTE — Patient Instructions (Addendum)
Try taking hydroxyzine closer to bedtime to see if that provides better sleep during the night. You also have the option of 2 pills if needed (20mg ).  Let me know if I can help further.   Continue same dose Wegovy for now, the option of increasing dose at any point.  Let me know if weight loss plateaus or if you would like to try the higher dose prior to your next visit.  Otherwise follow-up with me in 2 months and we can recheck weight discussed options at that time.  Let me know if there are questions or concerns in the interim and take care!

## 2023-05-28 NOTE — Progress Notes (Signed)
Subjective:  Patient ID: Brittney Bell, female    DOB: 1970/10/15  Age: 52 y.o. MRN: [id number]  CC:  Chief Complaint  Patient presents with   Weight Loss    Pt here for check in on weight loss and notes no concerns     HPI Brittney Bell presents for      Obesity Peak weight of 224, that improved to 183 with semaglutide treatment, but that medication was weaned off, cost prohibitive previously.  Eventually medication was covered and discussed at her September 26 visit.  Started on Wegovy 0.25 mg.  Doing well in October 28 visit without new side effects and 7 pound weight loss at that time, decided to increase to 0.5 mg dose.  Still doing well at that medication dosage without new nausea, vomiting, abdominal pain or neck swelling.  No neck swelling. Working well, less stress eating/food noise. Weight has decreased 9 pounds since October 28.would like to stay at 0.5mg  dose.   Body mass index is 30.13 kg/m. Wt Readings from Last 3 Encounters:  05/28/23 192 lb 6.4 oz (87.3 kg)  03/29/23 201 lb 12.8 oz (91.5 kg)  02/25/23 208 lb 6.4 oz (94.5 kg)   Generalized anxiety disorder With situational stressors, discussed last visit.  Discussed counseling with September, continuing on Prozac same dose and low-dose hydroxyzine as needed.  Improved at her October 28 visit with improved management of stress and planned on meeting with counselor. Boyfriend Brittney Bell has stage 4 colorectal cancer, some areas noted in lungs.  Has appt on 12/31. On chemotherapy.  She is doing ok. Still not sleeping well at times. Has tried hydroxyzine 10mg  at bedtime - few hours before - trouble staying asleep. Getting to sleep better than staying asleep.  Has not met with counselor yet - had to cancel d/t other obligations. Has been better with work balance.   HM: Flu vaccine today.   History but we have Patient Active Problem List   Diagnosis Date Noted   Major depressive disorder    Low back pain     Generalized anxiety disorder 05/20/2017   Essential hypertension, benign 05/15/2017   Past Medical History:  Diagnosis Date   Cellulitis and abscess of face 01/29/2013   Essential hypertension, benign 05/15/2017   Generalized anxiety disorder 05/20/2017   Low back pain    Major depressive disorder    Past Surgical History:  Procedure Laterality Date   BREAST CYST EXCISION Right 03/05/2020   Procedure: Excision of right nipple cyst;  Surgeon: Manus Rudd, MD;  Location: [facility];  Service: General;  Laterality: Right;  lma   TUBAL LIGATION     Allergies  Allergen Reactions   Azithromycin Hives   Tizanidine Other (See Comments)    Hypotension, severe 80/50s   Levaquin [Levofloxacin] Palpitations   Prior to Admission medications   Medication Sig Start Date End Date Taking? Authorizing Provider  FLUoxetine (PROZAC) 40 MG capsule Take 1 capsule (40 mg total) by mouth daily. 02/25/23  Yes Shade Flood, MD  hydrOXYzine (ATARAX) 10 MG tablet Take 1 tablet (10 mg total) by mouth 3 (three) times daily as needed for anxiety. Or sleep 02/25/23  Yes Shade Flood, MD  ipratropium (ATROVENT) 0.03 % nasal spray Place 2 sprays into both nostrils every 12 (twelve) hours. 06/24/22  Yes White, Adrienne R, NP  losartan (COZAAR) 25 MG tablet Take 1 tablet (25 mg total) by mouth daily. 02/25/23  Yes Shade Flood, MD  Semaglutide-Weight Management (WEGOVY) 0.5 MG/0.5ML SOAJ Inject 0.5 mg into the skin once a week. 03/29/23  Yes Shade Flood, MD   Social History   Socioeconomic History   Marital status: Widowed    Spouse name: Not on file   Number of children: Not on file   Years of education: 13   Highest education level: GED or equivalent  Occupational History   Not on file  Tobacco Use   Smoking status: Former    Current packs/day: 0.00    Types: Cigarettes    Quit date: 11/30/2015    Years since quitting: 7.4   Smokeless tobacco: Never  Substance and Sexual  Activity   Alcohol use: Yes    Alcohol/week: 0.0 standard drinks of alcohol    Comment: socially   Drug use: No   Sexual activity: Yes  Other Topics Concern   Not on file  Social History Narrative   Right handed   Lives alone   Social Drivers of Health   Financial Resource Strain: Low Risk  (05/28/2023)   Overall Financial Resource Strain (CARDIA)    Difficulty of Paying Living Expenses: Not hard at all  Food Insecurity: No Food Insecurity (05/28/2023)   Hunger Vital Sign    Worried About Running Out of Food in the Last Year: Never true    Ran Out of Food in the Last Year: Never true  Transportation Needs: No Transportation Needs (05/28/2023)   PRAPARE - Administrator, Civil Service (Medical): No    Lack of Transportation (Non-Medical): No  Physical Activity: Insufficiently Active (05/28/2023)   Exercise Vital Sign    Days of Exercise per Week: 3 days    Minutes of Exercise per Session: 20 min  Stress: No Stress Concern Present (05/28/2023)   [employer] of Occupational Health - Occupational Stress Questionnaire    Feeling of Stress : Only a little  Social Connections: Moderately Integrated (05/28/2023)   Social Connection and Isolation Panel [NHANES]    Frequency of Communication with Friends and Family: More than three times a week    Frequency of Social Gatherings with Friends and Family: Once a week    Attends Religious Services: More than 4 times per year    Active Member of [employer] or Organizations: Yes    Attends Banker Meetings: 1 to 4 times per year    Marital Status: Divorced  Catering manager Violence: Not on file    Review of Systems Per HPI.   Objective:   Vitals:   05/28/23 1016  BP: 122/70  Pulse: 69  Temp: 98.1 F (36.7 C)  TempSrc: Temporal  SpO2: 100%  Weight: 192 lb 6.4 oz (87.3 kg)  Height: 5\' 7"  (1.702 m)     Physical Exam Vitals reviewed.  Constitutional:      Appearance: Normal appearance. She is  well-developed.  HENT:     Head: Normocephalic and atraumatic.  Eyes:     Conjunctiva/sclera: Conjunctivae normal.     Pupils: Pupils are equal, round, and reactive to light.  Neck:     Vascular: No carotid bruit.  Cardiovascular:     Rate and Rhythm: Normal rate and regular rhythm.     Heart sounds: Normal heart sounds.  Pulmonary:     Effort: Pulmonary effort is normal.     Breath sounds: Normal breath sounds.  Abdominal:     Palpations: Abdomen is soft. There is no pulsatile mass.     Tenderness: There is no  abdominal tenderness.  Musculoskeletal:     Right lower leg: No edema.     Left lower leg: No edema.  Skin:    General: Skin is warm and dry.  Neurological:     Mental Status: She is alert and oriented to person, place, and time.  Psychiatric:        Mood and Affect: Mood normal.        Behavior: Behavior normal.     Assessment & Plan:  Brittney Bell is a 52 y.o. female . Class 1 obesity with body mass index (BMI) of 31.0 to 31.9 in adult, unspecified obesity type, unspecified whether serious comorbidity present  -Continued improvement and tolerating current dose Wegovy, option of increased dose but would like to remain at 0.5 mg.  Refilled.  Recheck in 2 months.  Can send me a message sooner if plateau in weight loss with option of higher dosing.  Insomnia, unspecified type Generalized anxiety disorder  -Situational stressors as above, anxiety overall managed well at this time.  Still option of counseling if needed but she is doing well.  Did recommend hydroxyzine closer to bedtime to see if that has more sustained benefit, or slight higher dose of 20 mg if needed.  Needs flu shot - Plan: Flu vaccine trivalent PF, 6mos and older(Flulaval,Afluria,Fluarix,Fluzone)   No orders of the defined types were placed in this encounter.  Patient Instructions  Try taking hydroxyzine closer to bedtime to see if that provides better sleep during the night. You also have the  option of 2 pills if needed (20mg ).  Let me know if I can help further.   Continue same dose Wegovy for now, the option of increasing dose at any point.  Let me know if weight loss plateaus or if you would like to try the higher dose prior to your next visit.  Otherwise follow-up with me in 2 months and we can recheck weight discussed options at that time.  Let me know if there are questions or concerns in the interim and take care!       Signed,   Meredith Staggers, MD [facility], [facility] 05/28/23 10:48 AM

## 2023-05-31 ENCOUNTER — Other Ambulatory Visit: Payer: Self-pay

## 2023-06-11 ENCOUNTER — Telehealth: Payer: Self-pay

## 2023-06-11 ENCOUNTER — Other Ambulatory Visit (HOSPITAL_COMMUNITY): Payer: Self-pay

## 2023-06-11 DIAGNOSIS — Z6831 Body mass index (BMI) 31.0-31.9, adult: Secondary | ICD-10-CM

## 2023-06-11 MED ORDER — WEGOVY 1 MG/0.5ML ~~LOC~~ SOAJ
1.0000 mg | SUBCUTANEOUS | 1 refills | Status: DC
Start: 2023-06-11 — End: 2023-07-29
  Filled 2023-06-11: qty 2, 28d supply, fill #0
  Filled 2023-07-19 – 2023-07-22 (×3): qty 2, 28d supply, fill #1

## 2023-06-11 NOTE — Telephone Encounter (Signed)
 Copied from CRM [id number]. Topic: Clinical - Medication Question >> Jun 11, 2023  9:01 AM Adelina Mings wrote: Reason for CRM: Wants to know can up dosage on Semaglutide-Weight Management (WEGOVY) 0.5 MG/0.5ML SOAJ

## 2023-06-11 NOTE — Addendum Note (Signed)
 Addended by: Meredith Staggers R on: 06/11/2023 12:18 PM   Modules accepted: Orders

## 2023-06-11 NOTE — Telephone Encounter (Signed)
 Pt has been informed.

## 2023-06-11 NOTE — Telephone Encounter (Signed)
 Office visit December 27.  At that time she wanted to stay at the 0.5 mg dose, but if she has had a plateau in weight loss and would like to try higher dose, I will send in the 1 mg dose.  If any nausea, vomiting, abdominal pain or new side effects on that dose let me know right away.  Otherwise follow-up within 2 months as planned.

## 2023-07-19 ENCOUNTER — Other Ambulatory Visit (HOSPITAL_COMMUNITY): Payer: Self-pay

## 2023-07-20 ENCOUNTER — Encounter (HOSPITAL_COMMUNITY): Payer: Self-pay

## 2023-07-20 ENCOUNTER — Other Ambulatory Visit (HOSPITAL_COMMUNITY): Payer: Self-pay

## 2023-07-21 ENCOUNTER — Telehealth: Payer: Self-pay

## 2023-07-21 ENCOUNTER — Telehealth: Payer: Self-pay | Admitting: Family Medicine

## 2023-07-21 NOTE — Telephone Encounter (Signed)
Routing to provider for review.  Copied from CRM [id number]. Topic: Clinical - Prescription Issue >> Jul 21, 2023  4:36 PM Alvino Blood C wrote: Reason for CRM: Patient states she was advised by the pharmacy that the following medication: Semaglutide-Weight Management (WEGOVY) 1 MG/0.5ML SOAJ would need a prior auth.

## 2023-07-21 NOTE — Telephone Encounter (Signed)
Pharmacy Patient Advocate Encounter   Received notification from  [employer]  that prior authorization for [facility] 1MG /0.5ML auto-injectors is required/requested.   Insurance verification completed.   The patient is insured through [facility] .   Per test claim: PA required; PA submitted to above mentioned insurance via CoverMyMeds Key/confirmation #/EOC [id number] Status is pending

## 2023-07-22 ENCOUNTER — Other Ambulatory Visit (HOSPITAL_COMMUNITY): Payer: Self-pay

## 2023-07-22 ENCOUNTER — Telehealth: Payer: Self-pay

## 2023-07-22 NOTE — Telephone Encounter (Signed)
 Sent to PA team.

## 2023-07-22 NOTE — Telephone Encounter (Signed)
Called pt to confirm approval of PA, Pt verbalized understanding.

## 2023-07-22 NOTE — Telephone Encounter (Signed)
 Pt has been notified.

## 2023-07-22 NOTE — Telephone Encounter (Signed)
Copied from CRM [id number]. Topic: General - Other >> Jul 22, 2023  2:47 PM Almira Coaster wrote: Reason for CRM: Patient is returning a call she received from [place].

## 2023-07-22 NOTE — Telephone Encounter (Signed)
 Noted

## 2023-07-22 NOTE — Telephone Encounter (Signed)
Pharmacy Patient Advocate Encounter  Received notification from [employer] that Prior Authorization for Wegovy 1mg /0.61ml has been APPROVED from 07/21/23 to 07/20/24

## 2023-07-29 ENCOUNTER — Other Ambulatory Visit (HOSPITAL_COMMUNITY): Payer: Self-pay

## 2023-07-29 ENCOUNTER — Ambulatory Visit: Payer: BC Managed Care – PPO | Admitting: Family Medicine

## 2023-07-29 ENCOUNTER — Encounter: Payer: Self-pay | Admitting: Family Medicine

## 2023-07-29 VITALS — BP 114/70 | HR 69 | Temp 98.0°F | Ht 67.0 in | Wt 188.8 lb

## 2023-07-29 DIAGNOSIS — Z20822 Contact with and (suspected) exposure to covid-19: Secondary | ICD-10-CM

## 2023-07-29 DIAGNOSIS — G4700 Insomnia, unspecified: Secondary | ICD-10-CM | POA: Diagnosis not present

## 2023-07-29 DIAGNOSIS — I10 Essential (primary) hypertension: Secondary | ICD-10-CM

## 2023-07-29 DIAGNOSIS — E66811 Obesity, class 1: Secondary | ICD-10-CM

## 2023-07-29 DIAGNOSIS — H938X3 Other specified disorders of ear, bilateral: Secondary | ICD-10-CM

## 2023-07-29 DIAGNOSIS — R0981 Nasal congestion: Secondary | ICD-10-CM | POA: Diagnosis not present

## 2023-07-29 DIAGNOSIS — E663 Overweight: Secondary | ICD-10-CM | POA: Diagnosis not present

## 2023-07-29 DIAGNOSIS — Z6831 Body mass index (BMI) 31.0-31.9, adult: Secondary | ICD-10-CM

## 2023-07-29 DIAGNOSIS — F411 Generalized anxiety disorder: Secondary | ICD-10-CM | POA: Diagnosis not present

## 2023-07-29 LAB — POC COVID19 BINAXNOW: SARS Coronavirus 2 Ag: NEGATIVE

## 2023-07-29 MED ORDER — LOSARTAN POTASSIUM 25 MG PO TABS
25.0000 mg | ORAL_TABLET | Freq: Every day | ORAL | 1 refills | Status: DC
  Filled 2023-07-29: qty 90, 90d supply, fill #0
  Filled 2023-11-07: qty 90, 90d supply, fill #1

## 2023-07-29 MED ORDER — WEGOVY 1 MG/0.5ML ~~LOC~~ SOAJ
1.0000 mg | SUBCUTANEOUS | 3 refills | Status: AC
  Filled 2023-07-29 – 2023-08-15 (×2): qty 2, 28d supply, fill #0
  Filled 2023-09-12: qty 2, 28d supply, fill #1
  Filled 2023-10-10: qty 2, 28d supply, fill #2
  Filled 2023-11-07: qty 2, 28d supply, fill #3

## 2023-07-29 MED ORDER — HYDROXYZINE HCL 10 MG PO TABS
10.0000 mg | ORAL_TABLET | Freq: Every evening | ORAL | 0 refills | Status: DC | PRN
Start: 2023-07-29 — End: 2024-02-25
  Filled 2023-07-29: qty 30, 15d supply, fill #0

## 2023-07-29 MED ORDER — FLUOXETINE HCL 40 MG PO CAPS
40.0000 mg | ORAL_CAPSULE | Freq: Every day | ORAL | 1 refills | Status: DC
  Filled 2023-07-29: qty 90, 90d supply, fill #0
  Filled 2023-11-07: qty 90, 90d supply, fill #1

## 2023-07-29 NOTE — Progress Notes (Signed)
 Subjective:  Patient ID: Brittney Bell, female    DOB: September 09, 1970  Age: 53 y.o. MRN: [id number]  CC:  Chief Complaint  Patient presents with   Obesity    Patient is here to recheck weight, she has lost some since her last visit. Everything has been going well.    HPI Brittney Bell presents for   Obesity Follow-up from December 27 visit.  See previous notes.  Peak weight of 224 and improved to 183 with semaglutide treatment but that medication was weaned off and cost prohibitive previously.  Medication eventually covered and started back on Wegovy 0.25 mg in September.  Improved 1 month later, dosage increased to 0.5 mg.  She decided to remain at 0.5 mg dose at her December visit, with 9 pound weight loss from prior visit.  Still losing weight.  Dosage has been increased to 1 mg and doing well with that dose. no side effects with current dose. Normal BM's. No n/v/abd pain/neck swelling.  Weight down 4 more pounds.  Gym on M/W/F past few weeks.  Still watching diet. Drinks water, no sodas. Unsweet tea.   Wt Readings from Last 3 Encounters:  07/29/23 188 lb 12.8 oz (85.6 kg)  05/28/23 192 lb 6.4 oz (87.3 kg)  03/29/23 201 lb 12.8 oz (91.5 kg)   Anxiety with insomnia Improved at December visit, we discussed taking hydroxyzine closer to bedtime to see if that better treated her sleep, with option of higher dosing of 20 mg if needed.  Option of counseling discussed if needed.  She was doing better with worklife balance. Continued on Prozac same dose.  Situational stressors with her boyfriend's health. She has been doing better - sleeping better with 20mg  dose, no side effects next day.   Hypertension: Off meds for 2 weeks, losartan 25mg . BP stable off meds - 120/72 range.   BP Readings from Last 3 Encounters:  07/29/23 114/70  05/28/23 122/70  03/29/23 128/78   Lab Results  Component Value Date   CREATININE 0.70 02/25/2023   Ear fullness: Past few days, feels like fluid  in ears, some nasal congestion. No treatments. no pain, no discharge. No cough/congestion, no fevers, feels well otherwise. exposure to covid last weekend - road trip with BF's sister who did not feel well on last day.    History Patient Active Problem List   Diagnosis Date Noted   Major depressive disorder    Low back pain    Generalized anxiety disorder 05/20/2017   Essential hypertension, benign 05/15/2017   Past Medical History:  Diagnosis Date   Cellulitis and abscess of face 01/29/2013   Essential hypertension, benign 05/15/2017   Generalized anxiety disorder 05/20/2017   Low back pain    Major depressive disorder    Past Surgical History:  Procedure Laterality Date   BREAST CYST EXCISION Right 03/05/2020   Procedure: Excision of right nipple cyst;  Surgeon: Manus Rudd, MD;  Location: [facility];  Service: General;  Laterality: Right;  lma   TUBAL LIGATION     Allergies  Allergen Reactions   Azithromycin Hives   Tizanidine Other (See Comments)    Hypotension, severe 80/50s   Levaquin [Levofloxacin] Palpitations   Prior to Admission medications   Medication Sig Start Date End Date Taking? Authorizing Provider  FLUoxetine (PROZAC) 40 MG capsule Take 1 capsule (40 mg total) by mouth daily. 02/25/23  Yes Shade Flood, MD  hydrOXYzine (ATARAX) 10 MG tablet Take 1-2 tablets (10-20  mg total) by mouth at bedtime as needed for anxiety. Or sleep 05/28/23  Yes Shade Flood, MD  ipratropium (ATROVENT) 0.03 % nasal spray Place 2 sprays into both nostrils every 12 (twelve) hours. 06/24/22  Yes White, Adrienne R, NP  losartan (COZAAR) 25 MG tablet Take 1 tablet (25 mg total) by mouth daily. 02/25/23  Yes Shade Flood, MD  Semaglutide-Weight Management [facility]) 1 MG/0.5ML SOAJ Inject 1 mg into the skin once a week. 06/11/23  Yes Shade Flood, MD   Social History   Socioeconomic History   Marital status: Widowed    Spouse name: Not on file   Number  of children: Not on file   Years of education: 13   Highest education level: GED or equivalent  Occupational History   Not on file  Tobacco Use   Smoking status: Former    Current packs/day: 0.00    Types: Cigarettes    Quit date: 11/30/2015    Years since quitting: 7.6   Smokeless tobacco: Never  Substance and Sexual Activity   Alcohol use: Yes    Alcohol/week: 0.0 standard drinks of alcohol    Comment: socially   Drug use: No   Sexual activity: Yes  Other Topics Concern   Not on file  Social History Narrative   Right handed   Lives alone   Social Drivers of Health   Financial Resource Strain: Low Risk  (05/28/2023)   Overall Financial Resource Strain (CARDIA)    Difficulty of Paying Living Expenses: Not hard at all  Food Insecurity: No Food Insecurity (05/28/2023)   Hunger Vital Sign    Worried About Running Out of Food in the Last Year: Never true    Ran Out of Food in the Last Year: Never true  Transportation Needs: No Transportation Needs (05/28/2023)   PRAPARE - Administrator, Civil Service (Medical): No    Lack of Transportation (Non-Medical): No  Physical Activity: Insufficiently Active (05/28/2023)   Exercise Vital Sign    Days of Exercise per Week: 3 days    Minutes of Exercise per Session: 20 min  Stress: No Stress Concern Present (05/28/2023)   [employer] of Occupational Health - Occupational Stress Questionnaire    Feeling of Stress : Only a little  Social Connections: Moderately Integrated (05/28/2023)   Social Connection and Isolation Panel [NHANES]    Frequency of Communication with Friends and Family: More than three times a week    Frequency of Social Gatherings with Friends and Family: Once a week    Attends Religious Services: More than 4 times per year    Active Member of [employer] or Organizations: Yes    Attends Banker Meetings: 1 to 4 times per year    Marital Status: Divorced  Catering manager Violence: Not on  file    Review of Systems   Objective:   Vitals:   07/29/23 0956  BP: 114/70  Pulse: 69  Temp: 98 F (36.7 C)  TempSrc: Temporal  SpO2: 99%  Weight: 188 lb 12.8 oz (85.6 kg)  Height: 5\' 7"  (1.702 m)     Physical Exam Vitals reviewed.  Constitutional:      General: She is not in acute distress.    Appearance: Normal appearance. She is well-developed.  HENT:     Head: Normocephalic and atraumatic.     Right Ear: Hearing, tympanic membrane, ear canal and external ear normal.     Left Ear: Hearing, tympanic membrane,  ear canal and external ear normal.     Nose: Nose normal.     Comments: Sinuses nontender.    Mouth/Throat:     Pharynx: No oropharyngeal exudate.  Eyes:     Conjunctiva/sclera: Conjunctivae normal.     Pupils: Pupils are equal, round, and reactive to light.  Neck:     Vascular: No carotid bruit.  Cardiovascular:     Rate and Rhythm: Normal rate and regular rhythm.     Heart sounds: Normal heart sounds. No murmur heard. Pulmonary:     Effort: Pulmonary effort is normal. No respiratory distress.     Breath sounds: Normal breath sounds. No wheezing or rhonchi.  Abdominal:     Palpations: Abdomen is soft. There is no pulsatile mass.     Tenderness: There is no abdominal tenderness.  Musculoskeletal:     Right lower leg: No edema.     Left lower leg: No edema.  Skin:    General: Skin is warm and dry.     Findings: No rash.  Neurological:     Mental Status: She is alert and oriented to person, place, and time.  Psychiatric:        Mood and Affect: Mood normal.        Behavior: Behavior normal.     Results for orders placed or performed in visit on 07/29/23  POC COVID-19 BinaxNow   Collection Time: 07/29/23 10:42 AM  Result Value Ref Range   SARS Coronavirus 2 Ag Negative Negative      Assessment & Plan:  Brittney Bell is a 53 y.o. female . Generalized anxiety disorder - Plan: FLUoxetine (PROZAC) 40 MG capsule, hydrOXYzine (ATARAX) 10 MG  tablet Insomnia, unspecified type - Plan: hydrOXYzine (ATARAX) 10 MG tablet  -Stable, continue same dose fluoxetine, hydroxyzine at night, RTC precautions.  Essential hypertension, benign - Plan: losartan (COZAAR) 25 MG tablet Overweight (BMI 25.0-29.9)  -Blood pressure looks okay in office off meds as above.  Decreased weight likely has been contributor to improved hypertension control.  Based on low dose of losartan previously we will hold on meds for now, continue home monitoring and restart losartan if blood pressures increase.  Follow-up in 3 months.  Sensation of fullness in both ears - Plan: POC COVID-19 BinaxNow Nasal congestion - Plan: POC COVID-19 BinaxNow Exposure to COVID-19 virus - Plan: POC COVID-19 BinaxNow  -Likely component of eustachian tube dysfunction, pressure from nasal congestion.  COVID testing negative in office but discussed possible early false negative with repeat testing in 24 to 48 hours recommended, symptomatic care,  RTC precautions.  Over-the-counter Flonase, saline nasal spray may help with nasal congestion and internal ear fullness.  Class 1 obesity with body mass index (BMI) of 31.0 to 31.9 in adult, unspecified obesity type, unspecified whether serious comorbidity present - Plan: Semaglutide-Weight Management (WEGOVY) 1 MG/0.5ML SOAJ  -Improving with current dose of Wegovy, will continue same.  Recheck in 3 months.  Meds ordered this encounter  Medications   losartan (COZAAR) 25 MG tablet    Sig: Take 1 tablet (25 mg total) by mouth daily if blood pressure elevates    Dispense:  90 tablet    Refill:  1   FLUoxetine (PROZAC) 40 MG capsule    Sig: Take 1 capsule (40 mg total) by mouth daily.    Dispense:  90 capsule    Refill:  1   hydrOXYzine (ATARAX) 10 MG tablet    Sig: Take 1-2 tablets (10-20 mg total)  by mouth at bedtime as needed for anxiety. Or sleep    Dispense:  30 tablet    Refill:  0   Semaglutide-Weight Management (WEGOVY) 1 MG/0.5ML SOAJ     Sig: Inject 1 mg into the skin once a week.    Dispense:  2 mL    Refill:  3   Patient Instructions  Congrats on the continued weight loss.  Okay to continue same dose of Wegovy for now but let me know if you see a plateau in weight loss and we can try a higher dose.  Recheck in 3 months.  Blood pressure looks okay today and may be improved with weight loss.  I did refill the losartan but continue to monitor blood pressure and then restart that medication if he starts seeing numbers in the 130s and 140s.  No new meds for now.  We can recheck labs at your next visit.  Continue fluoxetine same dose, hydroxyzine at bedtime as needed.  Ear fullness may be related to congestion, saline nasal spray, Flonase over-the-counter are sometimes helpful.  If any worsening symptoms schedule a visit but I expect you to continue to have mild symptoms.  Please let us know if there are questions and take care!    Signed,   Meredith Staggers, MD [facility], [facility] 07/29/23 10:34 AM

## 2023-07-29 NOTE — Patient Instructions (Addendum)
 Congrats on the continued weight loss.  Okay to continue same dose of Wegovy for now but let me know if you see a plateau in weight loss and we can try a higher dose.  Recheck in 3 months.  Blood pressure looks okay today and may be improved with weight loss.  I did refill the losartan but continue to monitor blood pressure and then restart that medication if he starts seeing numbers in the 130s and 140s.  No new meds for now.  We can recheck labs at your next visit.  Continue fluoxetine same dose, hydroxyzine at bedtime as needed.  Ear fullness may be related to congestion, saline nasal spray, Flonase over-the-counter are sometimes helpful.  If any worsening symptoms schedule a visit but I expect you to continue to have mild symptoms.  Please let us know if there are questions and take care!

## 2023-08-16 ENCOUNTER — Other Ambulatory Visit (HOSPITAL_COMMUNITY): Payer: Self-pay

## 2023-09-13 ENCOUNTER — Other Ambulatory Visit (HOSPITAL_COMMUNITY): Payer: Self-pay

## 2023-10-27 ENCOUNTER — Ambulatory Visit: Payer: BC Managed Care – PPO | Admitting: Family Medicine

## 2023-11-15 ENCOUNTER — Ambulatory Visit: Admitting: Family Medicine

## 2023-11-15 ENCOUNTER — Other Ambulatory Visit (HOSPITAL_COMMUNITY): Payer: Self-pay

## 2023-11-15 ENCOUNTER — Other Ambulatory Visit: Payer: Self-pay

## 2023-11-15 VITALS — BP 112/76 | HR 60 | Ht 67.0 in | Wt 184.0 lb

## 2023-11-15 DIAGNOSIS — F411 Generalized anxiety disorder: Secondary | ICD-10-CM

## 2023-11-15 DIAGNOSIS — E663 Overweight: Secondary | ICD-10-CM

## 2023-11-15 DIAGNOSIS — G4700 Insomnia, unspecified: Secondary | ICD-10-CM | POA: Diagnosis not present

## 2023-11-15 MED ORDER — ESZOPICLONE 1 MG PO TABS
1.0000 mg | ORAL_TABLET | Freq: Every evening | ORAL | 0 refills | Status: DC | PRN
Start: 2023-11-15 — End: 2024-02-25
  Filled 2023-11-15 (×3): qty 30, 30d supply, fill #0

## 2023-11-15 NOTE — Progress Notes (Signed)
 Subjective:  Patient ID: Brittney Bell, female    DOB: [birth year]  Age: 53 y.o. MRN: [id number]  CC:  Chief Complaint  Patient presents with   Follow-up    3 month follow up , pt  wants to discuss some different for sleep ,current medication is not helping     HPI Brittney Bell presents for   Obesity Peak weight 224, has been treated with Wegovy .  Previously successful, then medication weaned off due to cost, restarted when covered, on Wegovy  since September 2024.  Increasing doses with successful weight loss and tolerability.  Exercising 3 days/week, discussed at her February visit.  Avoiding sugar containing beverages and watching diet. Denies nausea, vomiting, abdominal pain, neck swelling or other new side effects.  Currently taking 1 mg weekly, with improved weight by 4 pounds since last visit. She was able to wean off of her losartan  for hypertension with stable blood pressure off meds with the weight loss. Has one more refill, then not covered  plans to check coverage and schedule visit to review options.  Home BP 120/70 off meds.   BP Readings from Last 3 Encounters:  11/15/23 112/76  07/29/23 114/70  05/28/23 122/70   Wt Readings from Last 3 Encounters:  11/15/23 184 lb (83.5 kg)  07/29/23 188 lb 12.8 oz (85.6 kg)  05/28/23 192 lb 6.4 oz (87.3 kg)   Generalized anxiety disorder with insomnia Last discussed in February.  Hydroxyzine  has been used and we discussed taking that closer to bedtime along with option of higher dosing if needed.  Option of counseling discussed but was doing better with worklife balance.  She was sleeping better with the higher dose of hydroxyzine  20 mg nightly without daytime sedation.  Since last visit feels like medication has been less effective for sleep.  She is still taking Prozac  40 mg daily. Hydroxyzine  - taking 2 per night usually - less effective past month. Not sleeping well. Spouse has chemo pump at home 2 nights per week -  noise keeps her awake. When he is up and down she has trouble sleeping. Other weeks does ok - sleeps 9-3, need hydroxyzine  intermittently. Usually wakes up early. She did try one of his ativan  - slept ok. Married in May. Trying to have spouse retire in August.   Physical with her gynecologist in August.       11/15/2023   10:12 AM 07/29/2023   10:01 AM 02/12/2022    9:36 AM 05/16/2021   10:54 AM 02/07/2021   10:59 AM  Depression screen PHQ 2/9  Decreased Interest 0 0 0 0 0  Down, Depressed, Hopeless 0 0 0 1 0  PHQ - 2 Score 0 0 0 1 0  Altered sleeping 1 1 1 1 2   Tired, decreased energy 1 1 1  0 2  Change in appetite 0 0 0 0 0  Feeling bad or failure about yourself  0 0 0 0 0  Trouble concentrating 0 0 0 0 0  Moving slowly or fidgety/restless 0 0 0 0 0  Suicidal thoughts 0 0 0 0 0  PHQ-9 Score 2 2 2 2 4   Difficult doing work/chores  Not difficult at all  Not difficult at all Not difficult at all      11/15/2023   10:12 AM 07/29/2023   10:01 AM 05/16/2021   10:53 AM 01/02/2020   11:34 AM  GAD 7 : Generalized Anxiety Score  Nervous, Anxious, on Edge 0 0 0 1  Control/stop worrying 0 0 0 1  Worry too much - different things 0 0 0 2  Trouble relaxing 0 0 0 0  Restless 0 0 0 0  Easily annoyed or irritable 0 0 0 0  Afraid - awful might happen 0 0 0 1  Total GAD 7 Score 0 0 0 5  Anxiety Difficulty Not difficult at all Not difficult at all Not difficult at all Not difficult at all      History Patient Active Problem List   Diagnosis Date Noted   Major depressive disorder    Low back pain    Generalized anxiety disorder 05/20/2017   Essential hypertension, benign 05/15/2017   Past Medical History:  Diagnosis Date   Cellulitis and abscess of face 01/29/2013   Essential hypertension, benign 05/15/2017   Generalized anxiety disorder 05/20/2017   Low back pain    Major depressive disorder    Past Surgical History:  Procedure Laterality Date   BREAST CYST EXCISION Right  03/05/2020   Procedure: Excision of right nipple cyst;  Surgeon: Dareen Ebbing, MD;  Location: [facility];  Service: General;  Laterality: Right;  lma   TUBAL LIGATION     Allergies  Allergen Reactions   Azithromycin  Hives   Tizanidine  Other (See Comments)    Hypotension, severe 80/50s   Levaquin [Levofloxacin] Palpitations   Prior to Admission medications   Medication Sig Start Date End Date Taking? Authorizing Provider  FLUoxetine  (PROZAC ) 40 MG capsule Take 1 capsule (40 mg total) by mouth daily. 07/29/23  Yes Benjiman Bras, MD  ipratropium (ATROVENT ) 0.03 % nasal spray Place 2 sprays into both nostrils every 12 (twelve) hours. 06/24/22  Yes Reena Canning, NP  losartan  (COZAAR ) 25 MG tablet Take 1 tablet (25 mg total) by mouth daily if blood pressure elevates 07/29/23  Yes Benjiman Bras, MD  Semaglutide -Weight Management (WEGOVY ) 1 MG/0.5ML SOAJ Inject 1 mg into the skin once a week. 07/29/23  Yes Benjiman Bras, MD  hydrOXYzine  (ATARAX ) 10 MG tablet Take 1-2 tablets (10-20 mg total) by mouth at bedtime as needed for anxiet or sleep. Patient not taking: Reported on 11/15/2023 07/29/23   Benjiman Bras, MD   Social History   Socioeconomic History   Marital status: Widowed    Spouse name: Not on file   Number of children: Not on file   Years of education: 13   Highest education level: GED or equivalent  Occupational History   Not on file  Tobacco Use   Smoking status: Former    Current packs/day: 0.00    Types: Cigarettes    Quit date: 11/30/2015    Years since quitting: 7.9   Smokeless tobacco: Never  Substance and Sexual Activity   Alcohol use: Yes    Alcohol/week: 0.0 standard drinks of alcohol    Comment: socially   Drug use: No   Sexual activity: Yes  Other Topics Concern   Not on file  Social History Narrative   Right handed   Lives alone   Social Drivers of Health   Financial Resource Strain: Low Risk  (11/15/2023)   Overall  Financial Resource Strain (CARDIA)    Difficulty of Paying Living Expenses: Not hard at all  Food Insecurity: No Food Insecurity (11/15/2023)   Hunger Vital Sign    Worried About Running Out of Food in the Last Year: Never true    Ran Out of Food in the Last Year: Never true  Transportation  Needs: No Transportation Needs (11/15/2023)   PRAPARE - Administrator, Civil Service (Medical): No    Lack of Transportation (Non-Medical): No  Physical Activity: Insufficiently Active (11/15/2023)   Exercise Vital Sign    Days of Exercise per Week: 5 days    Minutes of Exercise per Session: 20 min  Stress: Stress Concern Present (11/15/2023)   [employer] of Occupational Health - Occupational Stress Questionnaire    Feeling of Stress: To some extent  Social Connections: Socially Integrated (11/15/2023)   Social Connection and Isolation Panel    Frequency of Communication with Friends and Family: More than three times a week    Frequency of Social Gatherings with Friends and Family: Once a week    Attends Religious Services: More than 4 times per year    Active Member of [employer] or Organizations: Yes    Attends Engineer, structural: More than 4 times per year    Marital Status: Married  Catering manager Violence: Not on file    Review of Systems Per HPI.   Objective:   Vitals:   11/15/23 1015  BP: 112/76  Pulse: 60  SpO2: 99%  Weight: 184 lb (83.5 kg)  Height: 5' 7 (1.702 m)     Physical Exam Vitals reviewed.  Constitutional:      Appearance: Normal appearance. She is well-developed.  HENT:     Head: Normocephalic and atraumatic.   Eyes:     Conjunctiva/sclera: Conjunctivae normal.     Pupils: Pupils are equal, round, and reactive to light.   Neck:     Vascular: No carotid bruit.   Cardiovascular:     Rate and Rhythm: Normal rate and regular rhythm.     Heart sounds: Normal heart sounds.  Pulmonary:     Effort: Pulmonary effort is normal.      Breath sounds: Normal breath sounds.  Abdominal:     Palpations: Abdomen is soft. There is no pulsatile mass.     Tenderness: There is no abdominal tenderness.   Musculoskeletal:     Right lower leg: No edema.     Left lower leg: No edema.   Skin:    General: Skin is warm and dry.   Neurological:     Mental Status: She is alert and oriented to person, place, and time.   Psychiatric:        Mood and Affect: Mood normal.        Behavior: Behavior normal.        Assessment & Plan:  Brittney Bell is a 53 y.o. female . Insomnia, unspecified type - Plan: eszopiclone (LUNESTA) 1 MG TABS tablet  - Decreased control, due to distractors as above.  On off weeks when spouse is not receiving treatment the hydroxyzine  has been effective.  Okay to use hydroxyzine  on weeks when milder symptoms, short-term Lunesta prescription given, 1 mg 1-2 at bedtime as needed for more difficult weeks.  Handout given on insomnia again.  Potential side effects discussed.   Generalized anxiety disorder  - Stable with current dose fluoxetine , continue same, 71-month follow-up for physical.  Overweight (BMI 25.0-29.9)  - Unfortunately Wegovy  may not be covered after this month.  She will check with her insurance to see what is covered.  We briefly discussed self-pay zepbound option but can see alternate options from insurance first and recommended virtual visit discusses options, risks and benefits.   Meds ordered this encounter  Medications   eszopiclone (LUNESTA) 1 MG  TABS tablet    Sig: Take 1 tablet (1 mg total) by mouth at bedtime as needed for sleep. Take immediately before bedtime    Dispense:  30 tablet    Refill:  0   Patient Instructions  Here is number for weight loss specialist to discuss other meds if needed. Zepbound does have self pay option. We can discuss other options if needed as well. Follow up once you find out what will be covered. Virtual visit is fine if more convenient.     [facility]  [phone]   See info about sleep below, but can try Lunesta on more difficult weeks - 1-2 at bedtime. Let me know how this is working and what dose works.  Follow-up in 3 months for a physical, and we can decide if any lab work is needed at that time depending on what is done at gynecology.  Try to get a copy of those labs for your physical.  Take care!  Insomnia Insomnia is a sleep disorder that makes it difficult to fall asleep or stay asleep. Insomnia can cause fatigue, low energy, difficulty concentrating, mood swings, and poor performance at work or school. There are three different ways to classify insomnia: Difficulty falling asleep. Difficulty staying asleep. Waking up too early in the morning. Any type of insomnia can be long-term (chronic) or short-term (acute). Both are common. Short-term insomnia usually lasts for 3 months or less. Chronic insomnia occurs at least three times a week for longer than 3 months. What are the causes? Insomnia may be caused by another condition, situation, or substance, such as: Having certain mental health conditions, such as anxiety and depression. Using caffeine, alcohol, tobacco, or drugs. Having gastrointestinal conditions, such as gastroesophageal reflux disease (GERD). Having certain medical conditions. These include: Asthma. Alzheimer's disease. Stroke. Chronic pain. An overactive thyroid  gland (hyperthyroidism). Other sleep disorders, such as restless legs syndrome and sleep apnea. Menopause. Sometimes, the cause of insomnia may not be known. What increases the risk? Risk factors for insomnia include: Gender. Females are affected more often than males. Age. Insomnia is more common as people get older. Stress and certain medical and mental health conditions. Lack of exercise. Having an irregular work schedule. This may include working night shifts and traveling  between different time zones. What are the signs or symptoms? If you have insomnia, the main symptom is having trouble falling asleep or having trouble staying asleep. This may lead to other symptoms, such as: Feeling tired or having low energy. Feeling nervous about going to sleep. Not feeling rested in the morning. Having trouble concentrating. Feeling irritable, anxious, or depressed. How is this diagnosed? This condition may be diagnosed based on: Your symptoms and medical history. Your health care provider may ask about: Your sleep habits. Any medical conditions you have. Your mental health. A physical exam. How is this treated? Treatment for insomnia depends on the cause. Treatment may focus on treating an underlying condition that is causing the insomnia. Treatment may also include: Medicines to help you sleep. Counseling or therapy. Lifestyle adjustments to help you sleep better. Follow these instructions at home: Eating and drinking  Limit or avoid alcohol, caffeinated beverages, and products that contain nicotine and tobacco, especially close to bedtime. These can disrupt your sleep. Do not eat a large meal or eat spicy foods right before bedtime. This can lead to digestive discomfort that can make it hard for you to sleep. Sleep habits  Keep  a sleep diary to help you and your health care provider figure out what could be causing your insomnia. Write down: When you sleep. When you wake up during the night. How well you sleep and how rested you feel the next day. Any side effects of medicines you are taking. What you eat and drink. Make your bedroom a dark, comfortable place where it is easy to fall asleep. Put up shades or blackout curtains to block light from outside. Use a white noise machine to block noise. Keep the temperature cool. Limit screen use before bedtime. This includes: Not watching TV. Not using your smartphone, tablet, or computer. Stick to a routine  that includes going to bed and waking up at the same times every day and night. This can help you fall asleep faster. Consider making a quiet activity, such as reading, part of your nighttime routine. Try to avoid taking naps during the day so that you sleep better at night. Get out of bed if you are still awake after 15 minutes of trying to sleep. Keep the lights down, but try reading or doing a quiet activity. When you feel sleepy, go back to bed. General instructions Take over-the-counter and prescription medicines only as told by your health care provider. Exercise regularly as told by your health care provider. However, avoid exercising in the hours right before bedtime. Use relaxation techniques to manage stress. Ask your health care provider to suggest some techniques that may work well for you. These may include: Breathing exercises. Routines to release muscle tension. Visualizing peaceful scenes. Make sure that you drive carefully. Do not drive if you feel very sleepy. Keep all follow-up visits. This is important. Contact a health care provider if: You are tired throughout the day. You have trouble in your daily routine due to sleepiness. You continue to have sleep problems, or your sleep problems get worse. Get help right away if: You have thoughts about hurting yourself or someone else. Get help right away if you feel like you may hurt yourself or others, or have thoughts about taking your own life. Go to your nearest emergency room or: Call 911. Call the National Suicide Prevention Lifeline at 820-066-3423 or 988. This is open 24 hours a day. Text the Crisis Text Line at (867)442-8365. Summary Insomnia is a sleep disorder that makes it difficult to fall asleep or stay asleep. Insomnia can be long-term (chronic) or short-term (acute). Treatment for insomnia depends on the cause. Treatment may focus on treating an underlying condition that is causing the insomnia. Keep a sleep diary to  help you and your health care provider figure out what could be causing your insomnia. This information is not intended to replace advice given to you by your health care provider. Make sure you discuss any questions you have with your health care provider. Document Revised: 04/28/2021 Document Reviewed: 04/28/2021 Elsevier Patient Education  2024 Elsevier Inc.     Signed,   Caro Christmas, MD [facility], [facility] 11/15/23 11:08 AM

## 2023-11-15 NOTE — Patient Instructions (Addendum)
 Here is number for weight loss specialist to discuss other meds if needed. Zepbound does have self pay option. We can discuss other options if needed as well. Follow up once you find out what will be covered. Virtual visit is fine if more convenient.    [facility]  [phone]   See info about sleep below, but can try Lunesta on more difficult weeks - 1-2 at bedtime. Let me know how this is working and what dose works.  Follow-up in 3 months for a physical, and we can decide if any lab work is needed at that time depending on what is done at gynecology.  Try to get a copy of those labs for your physical.  Take care!  Insomnia Insomnia is a sleep disorder that makes it difficult to fall asleep or stay asleep. Insomnia can cause fatigue, low energy, difficulty concentrating, mood swings, and poor performance at work or school. There are three different ways to classify insomnia: Difficulty falling asleep. Difficulty staying asleep. Waking up too early in the morning. Any type of insomnia can be long-term (chronic) or short-term (acute). Both are common. Short-term insomnia usually lasts for 3 months or less. Chronic insomnia occurs at least three times a week for longer than 3 months. What are the causes? Insomnia may be caused by another condition, situation, or substance, such as: Having certain mental health conditions, such as anxiety and depression. Using caffeine, alcohol, tobacco, or drugs. Having gastrointestinal conditions, such as gastroesophageal reflux disease (GERD). Having certain medical conditions. These include: Asthma. Alzheimer's disease. Stroke. Chronic pain. An overactive thyroid  gland (hyperthyroidism). Other sleep disorders, such as restless legs syndrome and sleep apnea. Menopause. Sometimes, the cause of insomnia may not be known. What increases the risk? Risk factors for insomnia include: Gender. Females are  affected more often than males. Age. Insomnia is more common as people get older. Stress and certain medical and mental health conditions. Lack of exercise. Having an irregular work schedule. This may include working night shifts and traveling between different time zones. What are the signs or symptoms? If you have insomnia, the main symptom is having trouble falling asleep or having trouble staying asleep. This may lead to other symptoms, such as: Feeling tired or having low energy. Feeling nervous about going to sleep. Not feeling rested in the morning. Having trouble concentrating. Feeling irritable, anxious, or depressed. How is this diagnosed? This condition may be diagnosed based on: Your symptoms and medical history. Your health care provider may ask about: Your sleep habits. Any medical conditions you have. Your mental health. A physical exam. How is this treated? Treatment for insomnia depends on the cause. Treatment may focus on treating an underlying condition that is causing the insomnia. Treatment may also include: Medicines to help you sleep. Counseling or therapy. Lifestyle adjustments to help you sleep better. Follow these instructions at home: Eating and drinking  Limit or avoid alcohol, caffeinated beverages, and products that contain nicotine and tobacco, especially close to bedtime. These can disrupt your sleep. Do not eat a large meal or eat spicy foods right before bedtime. This can lead to digestive discomfort that can make it hard for you to sleep. Sleep habits  Keep a sleep diary to help you and your health care provider figure out what could be causing your insomnia. Write down: When you sleep. When you wake up during the night. How well you sleep and how rested you feel the next day.  Any side effects of medicines you are taking. What you eat and drink. Make your bedroom a dark, comfortable place where it is easy to fall asleep. Put up shades or  blackout curtains to block light from outside. Use a white noise machine to block noise. Keep the temperature cool. Limit screen use before bedtime. This includes: Not watching TV. Not using your smartphone, tablet, or computer. Stick to a routine that includes going to bed and waking up at the same times every day and night. This can help you fall asleep faster. Consider making a quiet activity, such as reading, part of your nighttime routine. Try to avoid taking naps during the day so that you sleep better at night. Get out of bed if you are still awake after 15 minutes of trying to sleep. Keep the lights down, but try reading or doing a quiet activity. When you feel sleepy, go back to bed. General instructions Take over-the-counter and prescription medicines only as told by your health care provider. Exercise regularly as told by your health care provider. However, avoid exercising in the hours right before bedtime. Use relaxation techniques to manage stress. Ask your health care provider to suggest some techniques that may work well for you. These may include: Breathing exercises. Routines to release muscle tension. Visualizing peaceful scenes. Make sure that you drive carefully. Do not drive if you feel very sleepy. Keep all follow-up visits. This is important. Contact a health care provider if: You are tired throughout the day. You have trouble in your daily routine due to sleepiness. You continue to have sleep problems, or your sleep problems get worse. Get help right away if: You have thoughts about hurting yourself or someone else. Get help right away if you feel like you may hurt yourself or others, or have thoughts about taking your own life. Go to your nearest emergency room or: Call 911. Call the National Suicide Prevention Lifeline at 506-425-1933 or 988. This is open 24 hours a day. Text the Crisis Text Line at 385-384-2803. Summary Insomnia is a sleep disorder that makes it  difficult to fall asleep or stay asleep. Insomnia can be long-term (chronic) or short-term (acute). Treatment for insomnia depends on the cause. Treatment may focus on treating an underlying condition that is causing the insomnia. Keep a sleep diary to help you and your health care provider figure out what could be causing your insomnia. This information is not intended to replace advice given to you by your health care provider. Make sure you discuss any questions you have with your health care provider. Document Revised: 04/28/2021 Document Reviewed: 04/28/2021 Elsevier Patient Education  2024 [employer].

## 2023-12-30 ENCOUNTER — Encounter: Payer: Self-pay | Admitting: Family Medicine

## 2024-02-04 ENCOUNTER — Other Ambulatory Visit (HOSPITAL_COMMUNITY): Payer: Self-pay

## 2024-02-04 MED ORDER — AMOXICILLIN-POT CLAVULANATE 875-125 MG PO TABS
1.0000 | ORAL_TABLET | Freq: Two times a day (BID) | ORAL | 0 refills | Status: AC
  Filled 2024-02-04: qty 14, 7d supply, fill #0

## 2024-02-25 ENCOUNTER — Ambulatory Visit: Admitting: Family Medicine

## 2024-02-25 ENCOUNTER — Ambulatory Visit (INDEPENDENT_AMBULATORY_CARE_PROVIDER_SITE_OTHER)
Admission: RE | Admit: 2024-02-25 | Discharge: 2024-02-25 | Disposition: A | Discharge status: Home / Self Care | Source: Ambulatory Visit | Attending: Family Medicine | Admitting: Family Medicine

## 2024-02-25 ENCOUNTER — Encounter: Payer: Self-pay | Admitting: Family Medicine

## 2024-02-25 ENCOUNTER — Ambulatory Visit: Payer: Self-pay | Admitting: Family Medicine

## 2024-02-25 ENCOUNTER — Other Ambulatory Visit (HOSPITAL_COMMUNITY): Payer: Self-pay

## 2024-02-25 VITALS — BP 120/78 | HR 69 | Temp 98.4°F | Resp 14 | Ht 67.0 in | Wt 192.8 lb

## 2024-02-25 DIAGNOSIS — R200 Anesthesia of skin: Secondary | ICD-10-CM

## 2024-02-25 DIAGNOSIS — R202 Paresthesia of skin: Secondary | ICD-10-CM | POA: Diagnosis not present

## 2024-02-25 DIAGNOSIS — Z0001 Encounter for general adult medical examination with abnormal findings: Secondary | ICD-10-CM

## 2024-02-25 DIAGNOSIS — G4700 Insomnia, unspecified: Secondary | ICD-10-CM | POA: Diagnosis not present

## 2024-02-25 DIAGNOSIS — E66811 Obesity, class 1: Secondary | ICD-10-CM

## 2024-02-25 DIAGNOSIS — Z131 Encounter for screening for diabetes mellitus: Secondary | ICD-10-CM

## 2024-02-25 DIAGNOSIS — M436 Torticollis: Secondary | ICD-10-CM | POA: Diagnosis not present

## 2024-02-25 DIAGNOSIS — Z1211 Encounter for screening for malignant neoplasm of colon: Secondary | ICD-10-CM

## 2024-02-25 DIAGNOSIS — Z6830 Body mass index (BMI) 30.0-30.9, adult: Secondary | ICD-10-CM

## 2024-02-25 DIAGNOSIS — Z9181 History of falling: Secondary | ICD-10-CM

## 2024-02-25 DIAGNOSIS — Z0000 Encounter for general adult medical examination without abnormal findings: Secondary | ICD-10-CM

## 2024-02-25 DIAGNOSIS — Z8679 Personal history of other diseases of the circulatory system: Secondary | ICD-10-CM

## 2024-02-25 DIAGNOSIS — F411 Generalized anxiety disorder: Secondary | ICD-10-CM | POA: Diagnosis not present

## 2024-02-25 DIAGNOSIS — Z13220 Encounter for screening for lipoid disorders: Secondary | ICD-10-CM | POA: Diagnosis not present

## 2024-02-25 LAB — COMPREHENSIVE METABOLIC PANEL WITH GFR
ALT: 17 U/L (ref 0–35)
AST: 20 U/L (ref 0–37)
Albumin: 4.7 g/dL (ref 3.5–5.2)
Alkaline Phosphatase: 90 U/L (ref 39–117)
BUN: 12 mg/dL (ref 6–23)
CO2: 29 meq/L (ref 19–32)
Calcium: 9.8 mg/dL (ref 8.4–10.5)
Chloride: 101 meq/L (ref 96–112)
Creatinine, Ser: 0.8 mg/dL (ref 0.40–1.20)
GFR: 83.54 mL/min (ref >60.00)
Glucose, Bld: 87 mg/dL (ref 70–99)
Potassium: 4.1 meq/L (ref 3.5–5.1)
Sodium: 137 meq/L (ref 135–145)
Total Bilirubin: 0.6 mg/dL (ref 0.2–1.2)
Total Protein: 8.2 g/dL (ref 6.0–8.3)

## 2024-02-25 LAB — CBC
HCT: 41.7 % (ref 36.0–46.0)
Hemoglobin: 13.7 g/dL (ref 12.0–15.0)
MCHC: 32.9 g/dL (ref 30.0–36.0)
MCV: 91.1 fl (ref 78.0–100.0)
Platelets: 295 K/uL (ref 150.0–400.0)
RBC: 4.58 Mil/uL (ref 3.87–5.11)
RDW: 15.9 % — ABNORMAL HIGH (ref 11.5–15.5)
WBC: 5.4 K/uL (ref 4.0–10.5)

## 2024-02-25 LAB — LIPID PANEL
Cholesterol: 199 mg/dL (ref 0–200)
HDL: 96.2 mg/dL (ref >39.00)
LDL Cholesterol: 93 mg/dL (ref 0–99)
NonHDL: 102.94
Total CHOL/HDL Ratio: 2
Triglycerides: 50 mg/dL (ref 0.0–149.0)
VLDL: 10 mg/dL (ref 0.0–40.0)

## 2024-02-25 LAB — TSH: TSH: 2.33 u[IU]/mL (ref 0.35–5.50)

## 2024-02-25 LAB — HEMOGLOBIN A1C: Hgb A1c MFr Bld: 5.4 % (ref 4.6–6.5)

## 2024-02-25 MED ORDER — FLUOXETINE HCL 40 MG PO CAPS
40.0000 mg | ORAL_CAPSULE | Freq: Every day | ORAL | 1 refills | Status: AC
  Filled 2024-02-25: qty 90, 90d supply, fill #0

## 2024-02-25 MED ORDER — ESZOPICLONE 1 MG PO TABS
1.0000 mg | ORAL_TABLET | Freq: Every evening | ORAL | 0 refills | Status: AC | PRN
Start: 2024-02-25 — End: ?
  Filled 2024-02-25: qty 30, 30d supply, fill #0

## 2024-02-25 NOTE — Patient Instructions (Signed)
 Thank you for coming in today. No change in medications at this time. If there are any concerns on your bloodwork, I will let you know.  Please have x-ray at the [facility] location below.  Depending on his results I would likely refer you to orthopedics to discuss the persistent arm symptoms and neck stiffness.  Be seen if any new or worsening symptoms.  I placed a referral for colonoscopy, when they call you let them know you would like to have that procedure in 6 weeks or further out if needed.  Nurse visit in the next 2 weeks for vaccines, you are due for flu, second shingles vaccine as well as Tdap.  Please let me know if there are questions and hang in there.   [facility] or xray: Walk in 8:30-4:30 during weekdays, no appointment needed [address]  [place], [place] [address]  Preventive Care 54-54 Years Old, Female Preventive care refers to lifestyle choices and visits with your health care provider that can promote health and wellness. Preventive care visits are also called wellness exams. What can I expect for my preventive care visit? Counseling Your health care provider may ask you questions about your: Medical history, including: Past medical problems. Family medical history. Pregnancy history. Current health, including: Menstrual cycle. Method of birth control. Emotional well-being. Home life and relationship well-being. Sexual activity and sexual health. Lifestyle, including: Alcohol, nicotine or tobacco, and drug use. Access to firearms. Diet, exercise, and sleep habits. Work and work Astron[employer]. Sunscreen use. Safety issues such as seatbelt and bike helmet use. Physical exam Your health care provider will check your: Height and weight. These may be used to calculate your BMI (body mass index). BMI is a measurement that tells if you are at a healthy weight. Waist circumference. This measures the distance around your waistline. This measurement also tells if you  are at a healthy weight and may help predict your risk of certain diseases, such as type 2 diabetes and high blood pressure. Heart rate and blood pressure. Body temperature. Skin for abnormal spots. What immunizations do I need?  Vaccines are usually given at various ages, according to a schedule. Your health care provider will recommend vaccines for you based on your age, medical history, and lifestyle or other factors, such as travel or where you work. What tests do I need? Screening Your health care provider may recommend screening tests for certain conditions. This may include: Lipid and cholesterol levels. Diabetes screening. This is done by checking your blood sugar (glucose) after you have not eaten for a while (fasting). Pelvic exam and Pap test. Hepatitis B test. Hepatitis C test. HIV (human immunodeficiency virus) test. STI (sexually transmitted infection) testing, if you are at risk. Lung cancer screening. Colorectal cancer screening. Mammogram. Talk with your health care provider about when you should start having regular mammograms. This may depend on whether you have a family history of breast cancer. BRCA-related cancer screening. This may be done if you have a family history of breast, ovarian, tubal, or peritoneal cancers. Bone density scan. This is done to screen for osteoporosis. Talk with your health care provider about your test results, treatment options, and if necessary, the need for more tests. Follow these instructions at home: Eating and drinking  Eat a diet that includes fresh fruits and vegetables, whole grains, lean protein, and low-fat dairy products. Take vitamin and mineral supplements as recommended by your health care provider. Do not drink alcohol if: Your health  care provider tells you not to drink. You are pregnant, may be pregnant, or are planning to become pregnant. If you drink alcohol: Limit how much you have to 0-1 drink a day. Know how much  alcohol is in your drink. In the [place] one drink equals one 12 oz bottle of beer (355 mL), one 5 oz glass of wine (148 mL), or one 1 oz glass of hard liquor (44 mL). Lifestyle Brush your teeth every morning and night with fluoride toothpaste. Floss one time each day. Exercise for at least 30 minutes 5 or more days each week. Do not use any products that contain nicotine or tobacco. These products include cigarettes, chewing tobacco, and vaping devices, such as e-cigarettes. If you need help quitting, ask your health care provider. Do not use drugs. If you are sexually active, practice safe sex. Use a condom or other form of protection to prevent STIs. If you do not wish to become pregnant, use a form of birth control. If you plan to become pregnant, see your health care provider for a prepregnancy visit. Take aspirin only as told by your health care provider. Make sure that you understand how much to take and what form to take. Work with your health care provider to find out whether it is safe and beneficial for you to take aspirin daily. Find healthy ways to manage stress, such as: Meditation, yoga, or listening to music. Journaling. Talking to a trusted person. Spending time with friends and family. Minimize exposure to UV radiation to reduce your risk of skin cancer. Safety Always wear your seat belt while driving or riding in a vehicle. Do not drive: If you have been drinking alcohol. Do not ride with someone who has been drinking. When you are tired or distracted. While texting. If you have been using any mind-altering substances or drugs. Wear a helmet and other protective equipment during sports activities. If you have firearms in your house, make sure you follow all gun safety procedures. Seek help if you have been physically or sexually abused. What's next? Visit your health care provider once a year for an annual wellness visit. Ask your health care provider how often you should  have your eyes and teeth checked. Stay up to date on all vaccines. This information is not intended to replace advice given to you by your health care provider. Make sure you discuss any questions you have with your health care provider. Document Revised: 11/13/2020 Document Reviewed: 11/13/2020 Elsevier Patient Education  2024 [employer].

## 2024-02-25 NOTE — Progress Notes (Signed)
 Subjective:  Patient ID: Brittney Bell, female    DOB: [birth year]  Age: 53 y.o. MRN: [id number]  CC:  Chief Complaint  Patient presents with   Annual Exam   Neck Pain    Neck pain from a fall. Hurt right side. Fingers are numb and tingling. She will wake up with numbness    HPI Brittney Bell presents for Annual Exam And acute concern as above.  PCP:[facility]: Dr. Marget  - rescheduled to next Tuesday.   History of fall, neck pain [place] August 15th - tripped in parking lot, fell onto R side. No head injury. No medical evaluation. R knee swelling, R shoulder bruised. Sore for a few days. Able to weight bear/walk, able to use arm. Everything improved except for bilateral hand numbness - notes in am or with driving. Noted some prior to fall, worse since fall, pain in arms in the morning - lasts few minutes until moving.  Neck feels tight. Tingling into both arms, no weakness. Does not remember any neck pain after fall.   Hypertension: Losartan  25 mg daily previously.  Home blood pressures reportedly 120/70 off of meds at her last visit. Still off meds, 120/65 on home readings.  BP Readings from Last 3 Encounters:  02/25/24 120/78  11/15/23 112/76  07/29/23 114/70   Lab Results  Component Value Date   CREATININE 0.70 02/25/2023   Obesity Peak weight 224.  Has been treated with Wegovy , along with exercise and watching diet, avoiding sugary beverages.  Weight had improved by 4 pounds at her last visit at the 1 mg dose.  There was some concern about insurance coverage after her next refill at June visit.  Has been using compounded semaglutide  weekly -with online provider - [facility] - just restarted this week. Off meds since July.   Generalized anxiety disorder with insomnia Prozac  40 mg daily with hydroxyzine  as needed at night.  Last discussed in June.  Trouble sleeping previously with spouse on chemo infusion pump.  Insufficient control with just hydroxyzine  last visit,  option of Lunesta  for more significant symptoms, hydroxyzine  on milder nights.  Mood doing ok. Spouse still under treatment - plan for bowel resection next week. She will be on FMLA for a few weeks. Has family help as well.  Using lunesta  once per week - not using hydroxyzine . Working better.      02/25/2024    8:32 AM 11/15/2023   10:12 AM 07/29/2023   10:01 AM 05/16/2021   10:53 AM  GAD 7 : Generalized Anxiety Score  Nervous, Anxious, on Edge 1 0 0 0  Control/stop worrying 1 0 0 0  Worry too much - different things 0 0 0 0  Trouble relaxing 1 0 0 0  Restless 1 0 0 0  Easily annoyed or irritable 1 0 0 0  Afraid - awful might happen 0 0 0 0  Total GAD 7 Score 5 0 0 0  Anxiety Difficulty Somewhat difficult Not difficult at all Not difficult at all Not difficult at all         02/25/2024    8:32 AM 11/15/2023   10:12 AM 07/29/2023   10:01 AM 02/12/2022    9:36 AM 05/16/2021   10:54 AM  Depression screen PHQ 2/9  Decreased Interest 0 0 0 0 0  Down, Depressed, Hopeless 0 0 0 0 1  PHQ - 2 Score 0 0 0 0 1  Altered sleeping 1 1 1 1 1   Tired,  decreased energy 0 1 1 1  0  Change in appetite 0 0 0 0 0  Feeling bad or failure about yourself  0 0 0 0 0  Trouble concentrating 1 0 0 0 0  Moving slowly or fidgety/restless 1 0 0 0 0  Suicidal thoughts 0 0 0 0 0  PHQ-9 Score 3 2 2 2 2   Difficult doing work/chores Somewhat difficult  Not difficult at all  Not difficult at all    Health Maintenance  Topic Date Due   Hepatitis C Screening  Never done   Cervical Cancer Screening (HPV/Pap Cotest)  Never done   Colonoscopy  Never done   Zoster Vaccines- Shingrix  (2 of 2) 04/09/2022   COVID-19 Vaccine (3 - 2025-26 season) 01/31/2024   Mammogram  02/12/2024   DTaP/Tdap/Td (1 - Tdap) 08/24/2024 (Originally 09/28/1989)   Pneumococcal Vaccine: 50+ Years (1 of 1 - PCV) 08/24/2024 (Originally 09/29/2019)   Hepatitis B Vaccines 19-59 Average Risk (1 of 3 - 19+ 3-dose series) 08/24/2024 (Originally  09/28/1989)   Influenza Vaccine  08/29/2024 (Originally 12/31/2023)   HIV Screening  Completed   HPV VACCINES  Aged Out   Meningococcal B Vaccine  Aged Out  Screening options with colonoscopy versus Cologuard discussed. Discussed timing of repeat testing intervals if normal, as well as potential need for diagnostic Colonoscopy if positive Cologuard. Understanding expressed, and chose Colonoscopy. Agrees to referral - 4-6 weeks out.  Mammogram next week.    Immunization History  Administered Date(s) Administered   Influenza, Seasonal, Injecte, Preservative Fre 05/28/2023   Influenza,inj,Quad PF,6+ Mos 05/15/2017, 05/16/2021   Influenza-Unspecified 03/04/2018, 03/14/2020, 03/27/2020   Moderna Sars-Covid-2 Vaccination 10/04/2019, 11/01/2019   Zoster Recombinant(Shingrix ) 02/12/2022  Flu vaccine - defers until after spouse's surgery.  2nd shingrix  due   - as above Tdap - due - as above.   No results found. Optho appt in last year. Stable.  Dental: yearly. No recent changes.   Alcohol: 5-6 per week.   Tobacco: none  since 2017. 15 years, 1/2 -1 ppd.   Exercise: gym every other week - 3 times per week.    History Patient Active Problem List   Diagnosis Date Noted   Anxiety 01/29/2022   Hypertensive disorder 01/29/2022   Major depressive disorder    Low back pain    Generalized anxiety disorder 05/20/2017   Essential hypertension, benign 05/15/2017   Past Medical History:  Diagnosis Date   Anemia 2018   Low iron   Cellulitis and abscess of face 01/29/2013   Essential hypertension, benign 05/15/2017   Generalized anxiety disorder 05/20/2017   Low back pain    Major depressive disorder    Past Surgical History:  Procedure Laterality Date   BREAST CYST EXCISION Right 03/05/2020   Procedure: Excision of right nipple cyst;  Surgeon: Belinda Cough, MD;  Location: [facility];  Service: General;  Laterality: Right;  lma   TUBAL LIGATION     Allergies  Allergen  Reactions   Azithromycin  Hives   Tizanidine  Other (See Comments)    Hypotension, severe 80/50s   Levaquin [Levofloxacin] Palpitations   Prior to Admission medications   Medication Sig Start Date End Date Taking? Authorizing Provider  eszopiclone  (LUNESTA ) 1 MG TABS tablet Take 1 tablet (1 mg total) by mouth at bedtime as needed for sleep. Take immediately before bedtime 11/15/23  Yes Levora Reyes SAUNDERS, MD  FLUoxetine  (PROZAC ) 40 MG capsule Take 1 capsule (40 mg total) by mouth daily. 07/29/23  Yes  Levora Reyes SAUNDERS, MD  ipratropium (ATROVENT ) 0.03 % nasal spray Place 2 sprays into both nostrils every 12 (twelve) hours. 06/24/22  Yes White, Shelba SAUNDERS, NP  losartan  (COZAAR ) 25 MG tablet Take 1 tablet (25 mg total) by mouth daily if blood pressure elevates 07/29/23  Yes Levora Reyes SAUNDERS, MD  Semaglutide -Weight Management (WEGOVY ) 1 MG/0.5ML SOAJ Inject 1 mg into the skin once a week. 07/29/23  Yes Levora Reyes SAUNDERS, MD  hydrOXYzine  (ATARAX ) 10 MG tablet Take 1-2 tablets (10-20 mg total) by mouth at bedtime as needed for anxiet or sleep. Patient not taking: Reported on 02/25/2024 07/29/23   Levora Reyes SAUNDERS, MD   Social History   Socioeconomic History   Marital status: Widowed    Spouse name: Not on file   Number of children: Not on file   Years of education: 13   Highest education level: GED or equivalent  Occupational History   Not on file  Tobacco Use   Smoking status: Former    Current packs/day: 0.00    Types: Cigarettes    Quit date: 11/30/2015    Years since quitting: 8.2   Smokeless tobacco: Never  Substance and Sexual Activity   Alcohol use: Yes    Alcohol/week: 5.0 standard drinks of alcohol    Types: 5 Standard drinks or equivalent per week    Comment: socially   Drug use: No   Sexual activity: Yes    Birth control/protection: Surgical  Other Topics Concern   Not on file  Social History Narrative   Right handed   Lives alone   Social Drivers of Health   Financial  Resource Strain: Low Risk  (11/15/2023)   Overall Financial Resource Strain (CARDIA)    Difficulty of Paying Living Expenses: Not hard at all  Food Insecurity: No Food Insecurity (11/15/2023)   Hunger Vital Sign    Worried About Running Out of Food in the Last Year: Never true    Ran Out of Food in the Last Year: Never true  Transportation Needs: No Transportation Needs (11/15/2023)   PRAPARE - Administrator, Civil Service (Medical): No    Lack of Transportation (Non-Medical): No  Physical Activity: Insufficiently Active (11/15/2023)   Exercise Vital Sign    Days of Exercise per Week: 5 days    Minutes of Exercise per Session: 20 min  Stress: Stress Concern Present (11/15/2023)   [employer] of Occupational Health - Occupational Stress Questionnaire    Feeling of Stress: To some extent  Social Connections: Socially Integrated (11/15/2023)   Social Connection and Isolation Panel    Frequency of Communication with Friends and Family: More than three times a week    Frequency of Social Gatherings with Friends and Family: Once a week    Attends Religious Services: More than 4 times per year    Active Member of [employer] or Organizations: Yes    Attends Engineer, structural: More than 4 times per year    Marital Status: Married  Catering manager Violence: Not on file    Review of Systems 13 point review of systems per patient health survey noted.  Negative other than as indicated above or in HPI.    Objective:   Vitals:   02/25/24 0828  BP: 120/78  Pulse: 69  Resp: 14  Temp: 98.4 F (36.9 C)  TempSrc: Temporal  SpO2: 98%  Weight: 192 lb 12.8 oz (87.5 kg)  Height: 5' 7 (1.702 m)  Physical Exam Constitutional:      Appearance: She is well-developed.  HENT:     Head: Normocephalic and atraumatic.     Right Ear: External ear normal.     Left Ear: External ear normal.  Eyes:     Conjunctiva/sclera: Conjunctivae normal.     Pupils: Pupils are equal,  round, and reactive to light.  Neck:     Thyroid : No thyromegaly.  Cardiovascular:     Rate and Rhythm: Normal rate and regular rhythm.     Heart sounds: Normal heart sounds. No murmur heard. Pulmonary:     Effort: Pulmonary effort is normal. No respiratory distress.     Breath sounds: Normal breath sounds. No wheezing.  Abdominal:     General: Bowel sounds are normal.     Palpations: Abdomen is soft.     Tenderness: There is no abdominal tenderness.  Musculoskeletal:        General: No tenderness. Normal range of motion.     Cervical back: Normal range of motion and neck supple.     Comments: C-spine, no midline bony tenderness.  Slight decreased rotation, lateral flexion, extension, no pain with motion.  No radicular symptoms with motion.  Spasm of right paraspinals, trapezius.  Equal arm strength, equal grip strength.  Neurovascular intact distally to fingertips.  Reflexes 2+ at biceps, brachial radialis bilaterally, difficulty with triceps reflex bilateral.   Lymphadenopathy:     Cervical: No cervical adenopathy.  Skin:    General: Skin is warm and dry.     Findings: No rash.  Neurological:     Mental Status: She is alert and oriented to person, place, and time.  Psychiatric:        Behavior: Behavior normal.        Thought Content: Thought content normal.        Assessment & Plan:  Brittney Bell is a 53 y.o. female . Annual physical exam - Plan: Comprehensive metabolic panel with GFR, Lipid panel, Hemoglobin A1c, TSH, CBC  - -anticipatory guidance as below in AVS, screening labs above. Health maintenance items as above in HPI discussed/recommended as applicable.   Generalized anxiety disorder - Plan: TSH Insomnia, unspecified type  - Stable with Prozac  and rare need for Lunesta , approximately once per week.  Continue same.  Off hydroxyzine .  Screen for colon cancer - Plan: Ambulatory referral to Gastroenterology  Neck stiffness - Plan: DG Cervical Spine  Complete History of fall - Plan: DG Cervical Spine Complete Bilateral arm numbness and tingling while sleeping - Plan: DG Cervical Spine Complete  - New concern as above, did have some dysesthesias in arms but feels like this is worse since the fall, episodic neck stiffness.  No midline bony tenderness, and based on timing of symptoms we will hold on CT at this time but start with plain x-ray imaging, likely orthopedic follow-up.  Question spasm plus or minus underlying DDD.  History of essential hypertension - Plan: Comprehensive metabolic panel with GFR, TSH, CBC  - Stable off meds, continue to monitor, labs as above.  Screening for diabetes mellitus - Plan: Hemoglobin A1c  Screening for hyperlipidemia - Plan: Lipid panel  Class 1 obesity without serious comorbidity with body mass index (BMI) of 30.0 to 30.9 in adult, unspecified obesity type  - She is on semaglutide  through online provider.  Labs as above.  Denies any neck swelling, or side effects with meds.  No orders of the defined types were placed in this encounter.  Patient Instructions  Thank you for coming in today. No change in medications at this time. If there are any concerns on your bloodwork, I will let you know.  Please have x-ray at the [facility] location below.  Depending on his results I would likely refer you to orthopedics to discuss the persistent arm symptoms and neck stiffness.  Be seen if any new or worsening symptoms.  I placed a referral for colonoscopy, when they call you let them know you would like to have that procedure in 6 weeks or further out if needed.  Nurse visit in the next 2 weeks for vaccines, you are due for flu, second shingles vaccine as well as Tdap.  Please let me know if there are questions and hang in there.   [facility] or xray: Walk in 8:30-4:30 during weekdays, no appointment needed [address]  [place], [place] [id number]  Preventive Care 28-50 Years Old, Female Preventive care  refers to lifestyle choices and visits with your health care provider that can promote health and wellness. Preventive care visits are also called wellness exams. What can I expect for my preventive care visit? Counseling Your health care provider may ask you questions about your: Medical history, including: Past medical problems. Family medical history. Pregnancy history. Current health, including: Menstrual cycle. Method of birth control. Emotional well-being. Home life and relationship well-being. Sexual activity and sexual health. Lifestyle, including: Alcohol, nicotine or tobacco, and drug use. Access to firearms. Diet, exercise, and sleep habits. Work and work Astronomer. Sunscreen use. Safety issues such as seatbelt and bike helmet use. Physical exam Your health care provider will check your: Height and weight. These may be used to calculate your BMI (body mass index). BMI is a measurement that tells if you are at a healthy weight. Waist circumference. This measures the distance around your waistline. This measurement also tells if you are at a healthy weight and may help predict your risk of certain diseases, such as type 2 diabetes and high blood pressure. Heart rate and blood pressure. Body temperature. Skin for abnormal spots. What immunizations do I need?  Vaccines are usually given at various ages, according to a schedule. Your health care provider will recommend vaccines for you based on your age, medical history, and lifestyle or other factors, such as travel or where you work. What tests do I need? Screening Your health care provider may recommend screening tests for certain conditions. This may include: Lipid and cholesterol levels. Diabetes screening. This is done by checking your blood sugar (glucose) after you have not eaten for a while (fasting). Pelvic exam and Pap test. Hepatitis B test. Hepatitis C test. HIV (human immunodeficiency virus) test. STI  (sexually transmitted infection) testing, if you are at risk. Lung cancer screening. Colorectal cancer screening. Mammogram. Talk with your health care provider about when you should start having regular mammograms. This may depend on whether you have a family history of breast cancer. BRCA-related cancer screening. This may be done if you have a family history of breast, ovarian, tubal, or peritoneal cancers. Bone density scan. This is done to screen for osteoporosis. Talk with your health care provider about your test results, treatment options, and if necessary, the need for more tests. Follow these instructions at home: Eating and drinking  Eat a diet that includes fresh fruits and vegetables, whole grains, lean protein, and low-fat dairy products. Take vitamin and mineral supplements as recommended by your health care provider. Do not drink  alcohol if: Your health care provider tells you not to drink. You are pregnant, may be pregnant, or are planning to become pregnant. If you drink alcohol: Limit how much you have to 0-1 drink a day. Know how much alcohol is in your drink. In the [place] one drink equals one 12 oz bottle of beer (355 mL), one 5 oz glass of wine (148 mL), or one 1 oz glass of hard liquor (44 mL). Lifestyle Brush your teeth every morning and night with fluoride toothpaste. Floss one time each day. Exercise for at least 30 minutes 5 or more days each week. Do not use any products that contain nicotine or tobacco. These products include cigarettes, chewing tobacco, and vaping devices, such as e-cigarettes. If you need help quitting, ask your health care provider. Do not use drugs. If you are sexually active, practice safe sex. Use a condom or other form of protection to prevent STIs. If you do not wish to become pregnant, use a form of birth control. If you plan to become pregnant, see your health care provider for a prepregnancy visit. Take aspirin only as told by your  health care provider. Make sure that you understand how much to take and what form to take. Work with your health care provider to find out whether it is safe and beneficial for you to take aspirin daily. Find healthy ways to manage stress, such as: Meditation, yoga, or listening to music. Journaling. Talking to a trusted person. Spending time with friends and family. Minimize exposure to UV radiation to reduce your risk of skin cancer. Safety Always wear your seat belt while driving or riding in a vehicle. Do not drive: If you have been drinking alcohol. Do not ride with someone who has been drinking. When you are tired or distracted. While texting. If you have been using any mind-altering substances or drugs. Wear a helmet and other protective equipment during sports activities. If you have firearms in your house, make sure you follow all gun safety procedures. Seek help if you have been physically or sexually abused. What's next? Visit your health care provider once a year for an annual wellness visit. Ask your health care provider how often you should have your eyes and teeth checked. Stay up to date on all vaccines. This information is not intended to replace advice given to you by your health care provider. Make sure you discuss any questions you have with your health care provider. Document Revised: 11/13/2020 Document Reviewed: 11/13/2020 Elsevier Patient Education  2024 Elsevier Inc.      Signed,   Reyes Pines, MD [facility], [facility] 02/25/24 9:13 AM

## 2024-02-29 ENCOUNTER — Other Ambulatory Visit (HOSPITAL_COMMUNITY): Payer: Self-pay

## 2024-02-29 MED ORDER — TRANEXAMIC ACID 650 MG PO TABS
1300.0000 mg | ORAL_TABLET | Freq: Three times a day (TID) | ORAL | 6 refills | Status: AC | PRN
  Filled 2024-02-29: qty 30, 5d supply, fill #0

## 2024-03-01 ENCOUNTER — Other Ambulatory Visit: Payer: Self-pay

## 2024-03-01 ENCOUNTER — Other Ambulatory Visit (HOSPITAL_COMMUNITY): Payer: Self-pay

## 2024-03-01 MED ORDER — MEDROXYPROGESTERONE ACETATE 10 MG PO TABS
10.0000 mg | ORAL_TABLET | Freq: Every day | ORAL | 6 refills | Status: AC
  Filled 2024-03-01: qty 10, 10d supply, fill #0
  Filled 2024-04-07: qty 10, 10d supply, fill #1
  Filled 2024-05-29 (×2): qty 10, 10d supply, fill #2

## 2024-03-10 ENCOUNTER — Ambulatory Visit

## 2024-04-07 ENCOUNTER — Encounter: Payer: Self-pay | Admitting: Pharmacist

## 2024-04-07 ENCOUNTER — Other Ambulatory Visit: Payer: Self-pay

## 2024-04-07 ENCOUNTER — Other Ambulatory Visit (HOSPITAL_COMMUNITY): Payer: Self-pay

## 2024-05-29 ENCOUNTER — Other Ambulatory Visit (HOSPITAL_COMMUNITY): Payer: Self-pay

## 2024-05-29 MED ORDER — BENZONATATE 200 MG PO CAPS
200.0000 mg | ORAL_CAPSULE | Freq: Three times a day (TID) | ORAL | 0 refills | Status: AC
  Filled 2024-05-29: qty 30, 10d supply, fill #0

## 2024-05-29 MED ORDER — OSELTAMIVIR PHOSPHATE 75 MG PO CAPS
75.0000 mg | ORAL_CAPSULE | Freq: Two times a day (BID) | ORAL | 0 refills | Status: AC
  Filled 2024-05-29: qty 10, 5d supply, fill #0

## 2024-06-06 ENCOUNTER — Other Ambulatory Visit (HOSPITAL_COMMUNITY): Payer: Self-pay

## 2024-06-08 ENCOUNTER — Other Ambulatory Visit (HOSPITAL_COMMUNITY): Payer: Self-pay

## 2024-08-25 ENCOUNTER — Ambulatory Visit: Admitting: Family Medicine
